# Patient Record
Sex: Female | Born: 1965
Health system: Southern US, Community
[De-identification: ages and names within clinical notes are randomized; demographics above are authoritative.]

## PROBLEM LIST (undated history)

## (undated) DIAGNOSIS — F419 Anxiety disorder, unspecified: Secondary | ICD-10-CM

## (undated) DIAGNOSIS — F32A Depression, unspecified: Secondary | ICD-10-CM

## (undated) DIAGNOSIS — F418 Other specified anxiety disorders: Secondary | ICD-10-CM

## (undated) DIAGNOSIS — I10 Essential (primary) hypertension: Secondary | ICD-10-CM

## (undated) DIAGNOSIS — G43909 Migraine, unspecified, not intractable, without status migrainosus: Secondary | ICD-10-CM

## (undated) DIAGNOSIS — K219 Gastro-esophageal reflux disease without esophagitis: Secondary | ICD-10-CM

## (undated) DIAGNOSIS — F329 Major depressive disorder, single episode, unspecified: Secondary | ICD-10-CM

## (undated) DIAGNOSIS — N200 Calculus of kidney: Secondary | ICD-10-CM

## (undated) DIAGNOSIS — Z87440 Personal history of urinary (tract) infections: Secondary | ICD-10-CM

## (undated) HISTORY — DX: Essential (primary) hypertension: I10

## (undated) HISTORY — DX: Migraine, unspecified, not intractable, without status migrainosus: G43.909

## (undated) HISTORY — DX: Calculus of kidney: N20.0

## (undated) HISTORY — DX: Personal history of urinary (tract) infections: Z87.440

## (undated) HISTORY — DX: Gastro-esophageal reflux disease without esophagitis: K21.9

## (undated) HISTORY — PX: OTHER SURGICAL HISTORY: SHX169

---

## 1992-03-09 HISTORY — PX: CHOLECYSTECTOMY: SHX55

## 2004-10-13 ENCOUNTER — Emergency Department: Payer: Self-pay | Admitting: Emergency Medicine

## 2006-05-06 ENCOUNTER — Ambulatory Visit (HOSPITAL_COMMUNITY): Admission: RE | Admit: 2006-05-06 | Discharge: 2006-05-06 | Payer: Self-pay

## 2008-07-27 ENCOUNTER — Ambulatory Visit (HOSPITAL_COMMUNITY): Admission: RE | Admit: 2008-07-27 | Discharge: 2008-07-27 | Payer: Self-pay | Admitting: Orthopedic Surgery

## 2008-08-27 ENCOUNTER — Encounter (HOSPITAL_COMMUNITY): Admission: RE | Admit: 2008-08-27 | Discharge: 2008-11-25 | Payer: Self-pay | Admitting: Orthopedic Surgery

## 2008-10-24 ENCOUNTER — Emergency Department (HOSPITAL_COMMUNITY): Admission: EM | Admit: 2008-10-24 | Discharge: 2008-10-24 | Payer: Self-pay | Admitting: Family Medicine

## 2010-03-31 ENCOUNTER — Encounter: Payer: Self-pay | Admitting: Orthopedic Surgery

## 2010-05-29 ENCOUNTER — Other Ambulatory Visit (HOSPITAL_COMMUNITY): Payer: Self-pay | Admitting: Neurosurgery

## 2010-05-29 DIAGNOSIS — M5410 Radiculopathy, site unspecified: Secondary | ICD-10-CM

## 2010-05-29 DIAGNOSIS — M549 Dorsalgia, unspecified: Secondary | ICD-10-CM

## 2010-05-31 ENCOUNTER — Ambulatory Visit (HOSPITAL_COMMUNITY): Payer: Commercial Managed Care - PPO

## 2010-05-31 ENCOUNTER — Ambulatory Visit (HOSPITAL_COMMUNITY)
Admission: RE | Admit: 2010-05-31 | Discharge: 2010-05-31 | Disposition: A | Discharge status: Home / Self Care | Payer: Commercial Managed Care - PPO | Source: Ambulatory Visit | Attending: Neurosurgery | Admitting: Neurosurgery

## 2010-05-31 DIAGNOSIS — M545 Low back pain, unspecified: Secondary | ICD-10-CM | POA: Insufficient documentation

## 2010-05-31 DIAGNOSIS — M549 Dorsalgia, unspecified: Secondary | ICD-10-CM

## 2010-05-31 DIAGNOSIS — M5410 Radiculopathy, site unspecified: Secondary | ICD-10-CM

## 2010-06-14 LAB — POCT URINALYSIS DIP (DEVICE)
Bilirubin Urine: NEGATIVE
Glucose, UA: NEGATIVE mg/dL
Hgb urine dipstick: NEGATIVE
Ketones, ur: NEGATIVE mg/dL
Nitrite: NEGATIVE
Protein, ur: NEGATIVE mg/dL
Specific Gravity, Urine: 1.02 (ref 1.005–1.030)
Urobilinogen, UA: 0.2 mg/dL (ref 0.0–1.0)
pH: 5.5 (ref 5.0–8.0)

## 2010-06-14 LAB — POCT PREGNANCY, URINE: Preg Test, Ur: NEGATIVE

## 2011-12-14 ENCOUNTER — Telehealth: Payer: Self-pay | Admitting: Internal Medicine

## 2011-12-14 NOTE — Telephone Encounter (Signed)
Returned pt called.  Ask pt to call office

## 2011-12-14 NOTE — Telephone Encounter (Signed)
Spoke with pt.  Pt made apponitment for her daughter

## 2012-12-17 ENCOUNTER — Emergency Department: Payer: Self-pay | Admitting: Emergency Medicine

## 2012-12-17 LAB — CBC
HGB: 13.3 g/dL (ref 12.0–16.0)
MCH: 31.5 pg (ref 26.0–34.0)
Platelet: 250 10*3/uL (ref 150–440)
RBC: 4.23 10*6/uL (ref 3.80–5.20)
RDW: 13.4 % (ref 11.5–14.5)

## 2012-12-17 LAB — COMPREHENSIVE METABOLIC PANEL
Alkaline Phosphatase: 110 U/L (ref 50–136)
Anion Gap: 6 — ABNORMAL LOW (ref 7–16)
Bilirubin,Total: 0.3 mg/dL (ref 0.2–1.0)
Calcium, Total: 9 mg/dL (ref 8.5–10.1)
Creatinine: 0.71 mg/dL (ref 0.60–1.30)
EGFR (African American): >60
EGFR (Non-African Amer.): >60

## 2012-12-17 LAB — URINALYSIS, COMPLETE
Bacteria: NONE SEEN
Bilirubin,UR: NEGATIVE
Glucose,UR: NEGATIVE mg/dL (ref 0–75)
Leukocyte Esterase: NEGATIVE
Ph: 6 (ref 4.5–8.0)
Protein: NEGATIVE
RBC,UR: 63 /HPF (ref 0–5)
Squamous Epithelial: <1
WBC UR: <1 /HPF (ref 0–5)

## 2012-12-17 LAB — LIPASE, BLOOD: Lipase: 96 U/L (ref 73–393)

## 2013-06-04 ENCOUNTER — Emergency Department: Payer: Self-pay | Admitting: Internal Medicine

## 2014-10-17 ENCOUNTER — Ambulatory Visit: Payer: Commercial Managed Care - PPO | Admitting: Nurse Practitioner

## 2014-10-22 ENCOUNTER — Ambulatory Visit (INDEPENDENT_AMBULATORY_CARE_PROVIDER_SITE_OTHER): Payer: 59 | Admitting: Nurse Practitioner

## 2014-10-22 ENCOUNTER — Encounter: Payer: Self-pay | Admitting: Nurse Practitioner

## 2014-10-22 ENCOUNTER — Encounter (INDEPENDENT_AMBULATORY_CARE_PROVIDER_SITE_OTHER): Payer: Self-pay

## 2014-10-22 VITALS — BP 118/70 | HR 84 | Temp 98.0°F | Resp 16 | Ht 63.0 in | Wt 166.0 lb

## 2014-10-22 DIAGNOSIS — F329 Major depressive disorder, single episode, unspecified: Secondary | ICD-10-CM | POA: Diagnosis not present

## 2014-10-22 DIAGNOSIS — Z7689 Persons encountering health services in other specified circumstances: Secondary | ICD-10-CM

## 2014-10-22 DIAGNOSIS — R609 Edema, unspecified: Secondary | ICD-10-CM

## 2014-10-22 DIAGNOSIS — K219 Gastro-esophageal reflux disease without esophagitis: Secondary | ICD-10-CM

## 2014-10-22 DIAGNOSIS — G43809 Other migraine, not intractable, without status migrainosus: Secondary | ICD-10-CM | POA: Diagnosis not present

## 2014-10-22 DIAGNOSIS — F410 Panic disorder [episodic paroxysmal anxiety] without agoraphobia: Secondary | ICD-10-CM

## 2014-10-22 DIAGNOSIS — Z7189 Other specified counseling: Secondary | ICD-10-CM

## 2014-10-22 DIAGNOSIS — F32A Depression, unspecified: Secondary | ICD-10-CM

## 2014-10-22 DIAGNOSIS — Z87442 Personal history of urinary calculi: Secondary | ICD-10-CM

## 2014-10-22 MED ORDER — BUSPIRONE HCL 7.5 MG PO TABS: 7.5000 mg | ORAL_TABLET | Freq: Three times a day (TID) | ORAL | Status: DC

## 2014-10-22 NOTE — Patient Instructions (Addendum)
[email or web address] website for compression   Zantac 150 mg up to twice a day before meals as needed for acid reflux   Stress and Stress Management Stress is a normal reaction to life events. It is what you feel when life demands more than you are used to or more than you can handle. Some stress can be useful. For example, the stress reaction can help you catch the last bus of the day, study for a test, or meet a deadline at work. But stress that occurs too often or for too long can cause problems. It can affect your emotional health and interfere with relationships and normal daily activities. Too much stress can weaken your immune system and increase your risk for physical illness. If you already have a medical problem, stress can make it worse. CAUSES  All sorts of life events may cause stress. An event that causes stress for one person may not be stressful for another person. Major life events commonly cause stress. These may be positive or negative. Examples include losing your job, moving into a new home, getting married, having a baby, or losing a loved one. Less obvious life events may also cause stress, especially if they occur day after day or in combination. Examples include working long hours, driving in traffic, caring for children, being in debt, or being in a difficult relationship. SIGNS AND SYMPTOMS Stress may cause emotional symptoms including, the following:  Anxiety. This is feeling worried, afraid, on edge, overwhelmed, or out of control.  Anger. This is feeling irritated or impatient.  Depression. This is feeling sad, down, helpless, or guilty.  Difficulty focusing, remembering, or making decisions. Stress may cause physical symptoms, including the following:   Aches and pains. These may affect your head, neck, back, stomach, or other areas of your body.  Tight muscles or clenched jaw.  Low energy or trouble sleeping. Stress may cause unhealthy behaviors, including the  following:   Eating to feel better (overeating) or skipping meals.  Sleeping too little, too much, or both.  Working too much or putting off tasks (procrastination).  Smoking, drinking alcohol, or using drugs to feel better. DIAGNOSIS  Stress is diagnosed through an assessment by your health care provider. Your health care provider will ask questions about your symptoms and any stressful life events.Your health care provider will also ask about your medical history and may order blood tests or other tests. Certain medical conditions and medicine can cause physical symptoms similar to stress. Mental illness can cause emotional symptoms and unhealthy behaviors similar to stress. Your health care provider may refer you to a mental health professional for further evaluation.  TREATMENT  Stress management is the recommended treatment for stress.The goals of stress management are reducing stressful life events and coping with stress in healthy ways.  Techniques for reducing stressful life events include the following:  Stress identification. Self-monitor for stress and identify what causes stress for you. These skills may help you to avoid some stressful events.  Time management. Set your priorities, keep a calendar of events, and learn to say "no." These tools can help you avoid making too many commitments. Techniques for coping with stress include the following:  Rethinking the problem. Try to think realistically about stressful events rather than ignoring them or overreacting. Try to find the positives in a stressful situation rather than focusing on the negatives.  Exercise. Physical exercise can release both physical and emotional tension. The key is to find a form of exercise  you enjoy and do it regularly.  Relaxation techniques. These relax the body and mind. Examples include yoga, meditation, tai chi, biofeedback, deep breathing, progressive muscle relaxation, listening to music, being  out in nature, journaling, and other hobbies. Again, the key is to find one or more that you enjoy and can do regularly.  Healthy lifestyle. Eat a balanced diet, get plenty of sleep, and do not smoke. Avoid using alcohol or drugs to relax.  Strong support network. Spend time with family, friends, or other people you enjoy being around.Express your feelings and talk things over with someone you trust. Counseling or talktherapy with a mental health professional may be helpful if you are having difficulty managing stress on your own. Medicine is typically not recommended for the treatment of stress.Talk to your health care provider if you think you need medicine for symptoms of stress. HOME CARE INSTRUCTIONS  Keep all follow-up visits as directed by your health care provider.  Take all medicines as directed by your health care provider. SEEK MEDICAL CARE IF:  Your symptoms get worse or you start having new symptoms.  You feel overwhelmed by your problems and can no longer manage them on your own. SEEK IMMEDIATE MEDICAL CARE IF:  You feel like hurting yourself or someone else. Document Released: 08/19/2000 Document Revised: 07/10/2013 Document Reviewed: 10/18/2012 [facility] Patient Information 2015 [place], [place]. This information is not intended to replace advice given to you by your health care provider. Make sure you discuss any questions you have with your health care provider.

## 2014-10-22 NOTE — Progress Notes (Signed)
Pre visit review using our clinic review tool, if applicable. No additional management support is needed unless otherwise documented below in the visit note. 

## 2014-10-22 NOTE — Progress Notes (Signed)
Patient ID: Christina Galvan, female    DOB: 1965-07-14  Age: 49 y.o. MRN: [id number]  CC: Establish Care   HPI ALDEA AVIS presents for establishing care today.   1) New pt info:  Immunizations- tdap 2007  Mammogram- 02/27/14 [facility]  Pap- [facility]  Eye Exam- 5 years ago, glasses   2) Chronic Problems-  Depression- last year, stress- money is a big issue   GERD- Prilosec, wants to switch to something else    Tomato sauce, [place] foods   Migraines- 6 months last one   Kidney stones- twice   3) Acute Problems- Swelling into legs with rash and pain intermittently x 1 year  Compression hose not helpful   Panic attacks x 1 year after son had a poor diagnosis   No counselor, supportive friend, up to 3 a day (attacks) 5 mins long. Breathes deeply she reports  History Elli has a past medical history of Depression; GERD (gastroesophageal reflux disease); Kidney stones; Hypertension; Migraines; and History of frequent urinary tract infections.   She has past surgical history that includes Cholecystectomy (1994) and Cesarean section (1993).   Her family history includes Arthritis in her mother; Asthma in her daughter; Cancer in her father and paternal uncle; Diabetes in her brother and mother; Heart disease in her maternal grandfather, mother, and paternal grandfather; Hypertension in her mother.She reports that she has been smoking Cigarettes.  She started smoking about 30 years ago. She has been smoking about 0.25 packs per day. She has never used smokeless tobacco. She reports that she drinks alcohol. She reports that she does not use illicit drugs.  No outpatient prescriptions prior to visit.   No facility-administered medications prior to visit.   ROS Review of Systems  Constitutional: Negative for fever, chills, diaphoresis and fatigue.  Respiratory: Negative for chest tightness, shortness of breath and wheezing.   Cardiovascular:  Negative for chest pain, palpitations and leg swelling.  Gastrointestinal: Negative for nausea, vomiting, diarrhea and constipation.  Skin: Negative for rash.  Neurological: Positive for headaches. Negative for dizziness, weakness and numbness.  Psychiatric/Behavioral: Positive for sleep disturbance. Negative for suicidal ideas. The patient is nervous/anxious.     Objective:  BP 118/70 mmHg  Pulse 84  Temp(Src) 98 F (36.7 C)  Resp 16  Ht 5\' 3"  (1.6 m)  Wt 166 lb (75.297 kg)  BMI 29.41 kg/m2  SpO2 98%  Physical Exam  Constitutional: She is oriented to person, place, and time. She appears well-developed and well-nourished. No distress.  HENT:  Head: Normocephalic and atraumatic.  Right Ear: External ear normal.  Left Ear: External ear normal.  Cardiovascular: Normal rate, regular rhythm and normal heart sounds.   Pulmonary/Chest: Effort normal and breath sounds normal. No respiratory distress. She has no wheezes. She has no rales. She exhibits no tenderness.  Neurological: She is alert and oriented to person, place, and time. No cranial nerve deficit. She exhibits normal muscle tone. Coordination normal.  Skin: Skin is warm and dry. No rash noted. She is not diaphoretic.  Psychiatric: Her behavior is normal. Judgment and thought content normal.  Patient is anxious, not tearful, good eye contact   Assessment & Plan:   Destenie was seen today for establish care.  Diagnoses and all orders for this visit:  Panic attack  Depression  Gastroesophageal reflux disease without esophagitis  Other migraine without status migrainosus, not intractable  History of nephrolithiasis  Peripheral edema  Encounter to establish care  Other orders -     busPIRone (BUSPAR) 7.5 MG tablet; Take 1 tablet (7.5 mg total) by mouth 3 (three) times daily.   I am having Ms. Espey start on busPIRone. I am also having her maintain her trandolapril and traMADol.  Meds ordered this encounter   Medications  . trandolapril (MAVIK) 1 MG tablet    Sig: Take 1 mg by mouth daily.  . traMADol (ULTRAM) 50 MG tablet    Sig: Take by mouth every 6 (six) hours as needed.  . busPIRone (BUSPAR) 7.5 MG tablet    Sig: Take 1 tablet (7.5 mg total) by mouth 3 (three) times daily.    Dispense:  90 tablet    Refill:  0    Order Specific Question:  Supervising Provider    Answer:  Crecencio Mc [[id number]]     Follow-up: Return in about 4 weeks (around 11/19/2014) for Anxiety .

## 2014-10-28 DIAGNOSIS — F329 Major depressive disorder, single episode, unspecified: Secondary | ICD-10-CM | POA: Insufficient documentation

## 2014-10-28 DIAGNOSIS — F32A Depression, unspecified: Secondary | ICD-10-CM | POA: Insufficient documentation

## 2014-10-28 DIAGNOSIS — G43909 Migraine, unspecified, not intractable, without status migrainosus: Secondary | ICD-10-CM | POA: Insufficient documentation

## 2014-10-28 DIAGNOSIS — F419 Anxiety disorder, unspecified: Secondary | ICD-10-CM

## 2014-10-28 DIAGNOSIS — Z87442 Personal history of urinary calculi: Secondary | ICD-10-CM | POA: Insufficient documentation

## 2014-10-28 DIAGNOSIS — K219 Gastro-esophageal reflux disease without esophagitis: Secondary | ICD-10-CM | POA: Insufficient documentation

## 2014-10-28 DIAGNOSIS — F418 Other specified anxiety disorders: Secondary | ICD-10-CM | POA: Insufficient documentation

## 2014-10-28 DIAGNOSIS — R609 Edema, unspecified: Secondary | ICD-10-CM | POA: Insufficient documentation

## 2014-10-28 DIAGNOSIS — Z7689 Persons encountering health services in other specified circumstances: Secondary | ICD-10-CM | POA: Insufficient documentation

## 2014-10-28 NOTE — Assessment & Plan Note (Signed)
Doubt cardiac or renal etiology. Probably vascular in nature. Pt reports compression hose not helpful, but unsure of what level she is using and reports cheap quality. Described trying a website listed on the AVS to try to obtain good quality compression hose. FU in 1 month.

## 2014-10-28 NOTE — Assessment & Plan Note (Signed)
Worsening. Will try Buspar 7.5 mg twice daily then up to 3 times daily after 7 days. Follow up in 1 month

## 2014-10-28 NOTE — Assessment & Plan Note (Signed)
Stable. 6 months ago last episode. Unsure about auras. Will follow.

## 2014-10-28 NOTE — Assessment & Plan Note (Signed)
Twice in her life. No recent episodes. Will follow

## 2014-10-28 NOTE — Assessment & Plan Note (Signed)
Prilosec not helpful. Uncontrolled without esophagitis. Understands her food triggers. Asked pt to try Zantac 150 mg 1 x daily 30 min before largest meal.

## 2014-10-28 NOTE — Assessment & Plan Note (Signed)
Discussed acute and chronic issues. Reviewed health maintenance measures, PFSHx, and immunizations. Obtain records from previous facility.   

## 2014-10-28 NOTE — Assessment & Plan Note (Signed)
Last year noticed feeling sadness and crying easily. Uncontrolled currently, but feels anxiety is more of an issue currently. Stressors include family and money. Will follow up next month

## 2014-11-14 ENCOUNTER — Other Ambulatory Visit: Payer: Self-pay | Admitting: *Deleted

## 2014-11-14 ENCOUNTER — Telehealth: Payer: Self-pay | Admitting: *Deleted

## 2014-11-14 MED ORDER — TRANDOLAPRIL 1 MG PO TABS: 1.0000 mg | ORAL_TABLET | Freq: Every day | ORAL | Status: DC

## 2014-11-14 NOTE — Telephone Encounter (Signed)
Left message on VM to return call to schedule appoint for TRamadol

## 2014-11-14 NOTE — Telephone Encounter (Addendum)
Fax from pharmacy requesting Tamadol 50mg  tablet take 1 tablet every 4-6 hours as needed for pain. Trandolapril 1mg  tablet take 1 tablet by mouth every morning.  Only OV 8.15.16, appears you have not filled this medication before.  Next OV 9.16.16.  Please advise refill

## 2014-11-14 NOTE — Telephone Encounter (Signed)
Okay to fill the Trandolapril #30 with 2 refills. I am not sure why she is on the tramadol. She will need to be seen to review if she still needs pain medication. Thanks!

## 2014-11-15 ENCOUNTER — Telehealth: Payer: Self-pay | Admitting: *Deleted

## 2014-11-15 NOTE — Telephone Encounter (Signed)
Spoke with pt, she states she will wait until her 9.13.16 appoint to discuss Tramadol.

## 2014-11-15 NOTE — Telephone Encounter (Signed)
Thanks

## 2014-11-20 ENCOUNTER — Encounter: Payer: Self-pay | Admitting: Nurse Practitioner

## 2014-11-20 ENCOUNTER — Ambulatory Visit (INDEPENDENT_AMBULATORY_CARE_PROVIDER_SITE_OTHER): Payer: 59 | Admitting: Nurse Practitioner

## 2014-11-20 VITALS — BP 120/84 | HR 78 | Temp 98.1°F | Resp 14 | Ht 63.0 in | Wt 168.8 lb

## 2014-11-20 DIAGNOSIS — Z13220 Encounter for screening for lipoid disorders: Secondary | ICD-10-CM

## 2014-11-20 DIAGNOSIS — Z833 Family history of diabetes mellitus: Secondary | ICD-10-CM | POA: Diagnosis not present

## 2014-11-20 DIAGNOSIS — K219 Gastro-esophageal reflux disease without esophagitis: Secondary | ICD-10-CM

## 2014-11-20 DIAGNOSIS — R609 Edema, unspecified: Secondary | ICD-10-CM

## 2014-11-20 DIAGNOSIS — Z130 Encounter for screening for diseases of the blood and blood-forming organs and certain disorders involving the immune mechanism: Secondary | ICD-10-CM | POA: Diagnosis not present

## 2014-11-20 DIAGNOSIS — F418 Other specified anxiety disorders: Secondary | ICD-10-CM

## 2014-11-20 DIAGNOSIS — R809 Proteinuria, unspecified: Secondary | ICD-10-CM | POA: Diagnosis not present

## 2014-11-20 DIAGNOSIS — M545 Low back pain, unspecified: Secondary | ICD-10-CM

## 2014-11-20 LAB — CBC WITH DIFFERENTIAL/PLATELET
BASOS PCT: 1.4 % (ref 0.0–3.0)
Basophils Absolute: 0.1 10*3/uL (ref 0.0–0.1)
EOS PCT: 1.3 % (ref 0.0–5.0)
Eosinophils Absolute: 0.1 10*3/uL (ref 0.0–0.7)
HCT: 44.2 % (ref 36.0–46.0)
Hemoglobin: 15 g/dL (ref 12.0–15.0)
LYMPHS ABS: 2.9 10*3/uL (ref 0.7–4.0)
Lymphocytes Relative: 38.3 % (ref 12.0–46.0)
MCHC: 34 g/dL (ref 30.0–36.0)
MCV: 93.9 fl (ref 78.0–100.0)
MONO ABS: 0.4 10*3/uL (ref 0.1–1.0)
Monocytes Relative: 4.7 % (ref 3.0–12.0)
NEUTROS ABS: 4.1 10*3/uL (ref 1.4–7.7)
NEUTROS PCT: 54.3 % (ref 43.0–77.0)
PLATELETS: 237 10*3/uL (ref 150.0–400.0)
RBC: 4.7 Mil/uL (ref 3.87–5.11)
RDW: 14.1 % (ref 11.5–15.5)
WBC: 7.5 10*3/uL (ref 4.0–10.5)

## 2014-11-20 LAB — LIPID PANEL
CHOL/HDL RATIO: 5
Cholesterol: 156 mg/dL (ref 0–200)
HDL: 33.4 mg/dL — ABNORMAL LOW (ref >39.00)
LDL Cholesterol: 89 mg/dL (ref 0–99)
NONHDL: 122.58
Triglycerides: 166 mg/dL — ABNORMAL HIGH (ref 0.0–149.0)
VLDL: 33.2 mg/dL (ref 0.0–40.0)

## 2014-11-20 LAB — COMPREHENSIVE METABOLIC PANEL
ALK PHOS: 72 U/L (ref 39–117)
ALT: 25 U/L (ref 0–35)
AST: 27 U/L (ref 0–37)
Albumin: 4 g/dL (ref 3.5–5.2)
BUN: 15 mg/dL (ref 6–23)
CHLORIDE: 104 meq/L (ref 96–112)
CO2: 28 meq/L (ref 19–32)
Calcium: 9.2 mg/dL (ref 8.4–10.5)
Creatinine, Ser: 0.52 mg/dL (ref 0.40–1.20)
GFR: 132.88 mL/min (ref >60.00)
GLUCOSE: 90 mg/dL (ref 70–99)
POTASSIUM: 4 meq/L (ref 3.5–5.1)
SODIUM: 137 meq/L (ref 135–145)
TOTAL PROTEIN: 7 g/dL (ref 6.0–8.3)
Total Bilirubin: 0.3 mg/dL (ref 0.2–1.2)

## 2014-11-20 LAB — HEMOGLOBIN A1C: HEMOGLOBIN A1C: 5.6 % (ref 4.6–6.5)

## 2014-11-20 MED ORDER — TRAMADOL HCL 50 MG PO TABS: 50.0000 mg | ORAL_TABLET | Freq: Four times a day (QID) | ORAL | Status: DC | PRN

## 2014-11-20 MED ORDER — OMEPRAZOLE 20 MG PO CPDR: 20.0000 mg | DELAYED_RELEASE_CAPSULE | Freq: Every day | ORAL | Status: DC

## 2014-11-20 NOTE — Patient Instructions (Addendum)
Follow up in 3 months.   Food Choices for Gastroesophageal Reflux Disease When you have gastroesophageal reflux disease (GERD), the foods you eat and your eating habits are very important. Choosing the right foods can help ease the discomfort of GERD. WHAT GENERAL GUIDELINES DO I NEED TO FOLLOW?  Choose fruits, vegetables, whole grains, low-fat dairy products, and low-fat meat, fish, and poultry.  Limit fats such as oils, salad dressings, butter, nuts, and avocado.  Keep a food diary to identify foods that cause symptoms.  Avoid foods that cause reflux. These may be different for different people.  Eat frequent small meals instead of three large meals each day.  Eat your meals slowly, in a relaxed setting.  Limit fried foods.  Cook foods using methods other than frying.  Avoid drinking alcohol.  Avoid drinking large amounts of liquids with your meals.  Avoid bending over or lying down until 2-3 hours after eating. WHAT FOODS ARE NOT RECOMMENDED? The following are some foods and drinks that may worsen your symptoms: Vegetables Tomatoes. Tomato juice. Tomato and spaghetti sauce. Chili peppers. Onion and garlic. Horseradish. Fruits Oranges, grapefruit, and lemon (fruit and juice). Meats High-fat meats, fish, and poultry. This includes hot dogs, ribs, ham, sausage, salami, and bacon. Dairy Whole milk and chocolate milk. Sour cream. Cream. Butter. Ice cream. Cream cheese.  Beverages Coffee and tea, with or without caffeine. Carbonated beverages or energy drinks. Condiments Hot sauce. Barbecue sauce.  Sweets/Desserts Chocolate and cocoa. Donuts. Peppermint and spearmint. Fats and Oils High-fat foods, including [place] fries and potato chips. Other Vinegar. Strong spices, such as black pepper, white pepper, red pepper, cayenne, curry powder, cloves, ginger, and chili powder. The items listed above may not be a complete list of foods and beverages to avoid. Contact your  dietitian for more information. Document Released: 02/23/2005 Document Revised: 02/28/2013 Document Reviewed: 12/28/2012 [facility] Patient Information 2015 [place], [place]. This information is not intended to replace advice given to you by your health care provider. Make sure you discuss any questions you have with your health care provider.

## 2014-11-20 NOTE — Progress Notes (Signed)
Pre visit review using our clinic review tool, if applicable. No additional management support is needed unless otherwise documented below in the visit note. 

## 2014-11-20 NOTE — Progress Notes (Addendum)
Patient ID: Christina Galvan, female    DOB: September 13, 1965  Age: 49 y.o. MRN: [id number]  CC: Follow-up   HPI Christina Galvan presents for GERD, depression with anxiety, and peripheral edema.  1) GERD- Zantac- 150 mg daily asked her to try in August;   Pt reports not working as well for her, burning sensation  2) Anxiety- Buspar 7.5 mg up to three x daily started last visit in August  Pt reports she is taking 1/2 prn due to "mellowing out too much"   3) Peripheral Edema- tried compression hose  Pt reports helpful   4) Depression- Denies further problems at this time.   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 Pt went to hospital, then followed up with Dr. Gladstone Lighter L4-L5 and S1 was tilted.  Neurosurgery- surgeon told her to work on conservative therapy.   Tramadol- takes one at work since she works on her feet   Lose dose this morning   History Christina Galvan has a past medical history of Depression; GERD (gastroesophageal reflux disease); Kidney stones; Hypertension; Migraines; and History of frequent urinary tract infections.   She has past surgical history that includes Cholecystectomy (1994) and Cesarean section (1993).   Her family history includes Arthritis in her mother; Asthma in her daughter; Cancer in her father and paternal uncle; Diabetes in her brother and mother; Heart disease in her maternal grandfather, mother, and paternal grandfather; Hypertension in her mother.She reports that she has been smoking Cigarettes.  She started smoking about 30 years ago. She has been smoking about 0.25 packs per day. She has never used smokeless tobacco. She reports that she drinks alcohol. She reports that she does not use illicit drugs.  Outpatient Prescriptions Prior to Visit  Medication Sig Dispense Refill  . busPIRone (BUSPAR) 7.5 MG tablet Take 1 tablet (7.5  mg total) by mouth 3 (three) times daily. 90 tablet 0  . trandolapril (MAVIK) 1 MG tablet Take 1 tablet (1 mg total) by mouth daily. 30 tablet 2  . traMADol (ULTRAM) 50 MG tablet Take by mouth every 6 (six) hours as needed.     No facility-administered medications prior to visit.    ROS Review of Systems  Constitutional: Negative for fever, chills, diaphoresis and fatigue.  Respiratory: Negative for chest tightness, shortness of breath and wheezing.   Cardiovascular: Positive for leg swelling. Negative for chest pain and palpitations.  Gastrointestinal: Negative for nausea, vomiting and diarrhea.  Musculoskeletal: Positive for back pain.  Skin: Negative for rash.  Neurological: Negative for dizziness, weakness, numbness and headaches.  Psychiatric/Behavioral: The patient is nervous/anxious.     Objective:  BP 120/84 mmHg  Pulse 78  Temp(Src) 98.1 F (36.7 C)  Resp 14  Ht _0  (1.6 m)  Wt 168 lb 12.8 oz (76.567 kg)  BMI 29.91 kg/m2  SpO2 98%  Physical Exam  Constitutional: She is oriented to person, place, and time. She appears well-developed and well-nourished. No distress.  HENT:  Head: Normocephalic and atraumatic.  Right Ear: External ear normal.  Left Ear: External ear normal.  Cardiovascular: Normal rate, regular rhythm, normal heart sounds and intact distal pulses.  Exam reveals no gallop and no friction rub.   No murmur heard. Pulmonary/Chest: Effort normal and breath sounds normal. No respiratory distress. She has no wheezes. She has no rales.  She exhibits no tenderness.  Musculoskeletal: She exhibits tenderness.  Palpation of lower back paraspinal muscles  Neurological: She is alert and oriented to person, place, and time. No cranial nerve deficit. She exhibits normal muscle tone. Coordination normal.  Skin: Skin is warm and dry. No rash noted. She is not diaphoretic.  Psychiatric: She has a normal mood and affect. Her behavior is normal. Judgment and thought  content normal.   Assessment & Plan:   Christina Galvan was seen today for follow-up.  Diagnoses and all orders for this visit:  Protein in urine -     Comp Met (CMET)  Screening for deficiency anemia -     CBC w/Diff  Family history of diabetes mellitus (DM) -     Comp Met (CMET) -     HgB A1c  Screening for hyperlipidemia -     Lipid Profile  Gastroesophageal reflux disease without esophagitis  Depression with anxiety  Peripheral edema  Bilateral low back pain without sciatica  Other orders -     traMADol (ULTRAM) 50 MG tablet; Take 1 tablet (50 mg total) by mouth every 6 (six) hours as needed. -     omeprazole (PRILOSEC) 20 MG capsule; Take 1 capsule (20 mg total) by mouth daily.   I have changed Ms. Shrout's traMADol. I am also having her start on omeprazole. Additionally, I am having her maintain her busPIRone and trandolapril.  Meds ordered this encounter  Medications  . traMADol (ULTRAM) 50 MG tablet    Sig: Take 1 tablet (50 mg total) by mouth every 6 (six) hours as needed.    Dispense:  90 tablet    Refill:  0    Order Specific Question:  Supervising Provider    Answer:  Deborra Medina L [[id number]]  . omeprazole (PRILOSEC) 20 MG capsule    Sig: Take 1 capsule (20 mg total) by mouth daily.    Dispense:  30 capsule    Refill:  3    Order Specific Question:  Supervising Provider    Answer:  Crecencio Mc [[id number]]     Follow-up: Return in about 3 months (around 02/19/2015) for Follow up for anxiety and GERD.

## 2014-11-30 ENCOUNTER — Encounter: Payer: Self-pay | Admitting: Nurse Practitioner

## 2014-11-30 DIAGNOSIS — R809 Proteinuria, unspecified: Secondary | ICD-10-CM | POA: Insufficient documentation

## 2014-11-30 DIAGNOSIS — M549 Dorsalgia, unspecified: Secondary | ICD-10-CM | POA: Insufficient documentation

## 2014-11-30 DIAGNOSIS — Z833 Family history of diabetes mellitus: Secondary | ICD-10-CM | POA: Insufficient documentation

## 2014-11-30 NOTE — Assessment & Plan Note (Signed)
Will obtain A1c and screen for hyperlipidemia as well.

## 2014-11-30 NOTE — Assessment & Plan Note (Signed)
Patient had trauma in 2004 and has seen ortho spine surgeon in past. Conservative therapy recommended. Will continue Tramadol. CSC and UDS completed today. Will follow up in 3 months.

## 2014-11-30 NOTE — Assessment & Plan Note (Signed)
Uncontrolled. Gave pt handout with foods that exacerbate GERD. Placed pt on Prilosec. Will follow up in 3 months.

## 2014-11-30 NOTE — Assessment & Plan Note (Signed)
Stable. Buspar taking 1/2 tablet prn for anxiety since last visit. Working well. If she takes a full tablet she reports too mellow feeling.

## 2014-11-30 NOTE — Assessment & Plan Note (Signed)
[facility] pt has urine tested and shows trace of protein. Pt curious as to why. Will check A1c and CMET today.

## 2014-11-30 NOTE — Assessment & Plan Note (Signed)
Stable. Compression hose helpful. FU in 3 months

## 2014-12-04 ENCOUNTER — Ambulatory Visit: Payer: 59 | Admitting: Nurse Practitioner

## 2014-12-12 ENCOUNTER — Encounter: Payer: Self-pay | Admitting: Nurse Practitioner

## 2014-12-14 ENCOUNTER — Ambulatory Visit: Payer: 59 | Admitting: Internal Medicine

## 2015-01-08 ENCOUNTER — Other Ambulatory Visit: Payer: Self-pay

## 2015-01-08 ENCOUNTER — Other Ambulatory Visit: Payer: Self-pay | Admitting: Nurse Practitioner

## 2015-01-08 MED ORDER — TRAMADOL HCL 50 MG PO TABS: 50.0000 mg | ORAL_TABLET | Freq: Four times a day (QID) | ORAL | Status: DC | PRN

## 2015-01-08 NOTE — Telephone Encounter (Signed)
Patient is asking for a refill. Please advise?

## 2015-03-07 ENCOUNTER — Other Ambulatory Visit: Payer: Self-pay | Admitting: Nurse Practitioner

## 2015-03-12 ENCOUNTER — Other Ambulatory Visit: Payer: Self-pay | Admitting: Nurse Practitioner

## 2015-03-13 NOTE — Telephone Encounter (Signed)
Patient seen in September. She had medication refilled in November. Please advise?

## 2015-03-15 ENCOUNTER — Ambulatory Visit (INDEPENDENT_AMBULATORY_CARE_PROVIDER_SITE_OTHER): Payer: 59 | Admitting: Nurse Practitioner

## 2015-03-15 VITALS — BP 158/98 | HR 97 | Ht 63.0 in | Wt 166.0 lb

## 2015-03-15 DIAGNOSIS — F418 Other specified anxiety disorders: Secondary | ICD-10-CM | POA: Diagnosis not present

## 2015-03-15 DIAGNOSIS — F410 Panic disorder [episodic paroxysmal anxiety] without agoraphobia: Secondary | ICD-10-CM

## 2015-03-15 DIAGNOSIS — M545 Low back pain, unspecified: Secondary | ICD-10-CM

## 2015-03-15 MED ORDER — TRAMADOL HCL 50 MG PO TABS: 50.0000 mg | ORAL_TABLET | Freq: Four times a day (QID) | ORAL | Status: DC | PRN

## 2015-03-15 MED ORDER — VENLAFAXINE HCL ER 37.5 MG PO TB24: 1.0000 | ORAL_TABLET | Freq: Every day | ORAL | Status: DC

## 2015-03-15 NOTE — Patient Instructions (Addendum)
Follow up in 3 months

## 2015-03-15 NOTE — Progress Notes (Signed)
Patient ID: [name], female    DOB: 1965-08-05  Age: 50 y.o. MRN: [id number]  CC: Follow-up   HPI Christina Galvan presents for follow up of pain medication.   1) Back pain- She reports taking tramadol as needed when walking a lot at work  Moderate to severe pain  Missed a few days of the medication due to running out and feels her back pain is worse Does not want surgery  2) Panic attacks- still happening, discussed options  Feels very panicky in multiple types of situations with tachycardia, diaphoresis, crying ect...  History Christina Galvan has a past medical history of Depression; GERD (gastroesophageal reflux disease); Kidney stones; Hypertension; Migraines; and History of frequent urinary tract infections.   She has past surgical history that includes Cholecystectomy (1994) and Cesarean section (1993).   Her family history includes Arthritis in her mother; Asthma in her daughter; Cancer in her father and paternal uncle; Diabetes in her brother and mother; Heart disease in her maternal grandfather, mother, and paternal grandfather; Hypertension in her mother.She reports that she has been smoking Cigarettes.  She started smoking about 30 years ago. She has been smoking about 0.25 packs per day. She has never used smokeless tobacco. She reports that she drinks alcohol. She reports that she does not use illicit drugs.  Outpatient Prescriptions Prior to Visit  Medication Sig Dispense Refill  . omeprazole (PRILOSEC) 20 MG capsule Take 1 capsule (20 mg total) by mouth daily. 30 capsule 3  . trandolapril (MAVIK) 1 MG tablet Take 1 tablet (1 mg total) by mouth daily. 30 tablet 2  . busPIRone (BUSPAR) 7.5 MG tablet Take 1 tablet (7.5 mg total) by mouth 3 (three) times daily. 90 tablet 0  . traMADol (ULTRAM) 50 MG tablet Take 1 tablet (50 mg total) by mouth every 6 (six) hours as needed. 90 tablet 0   No facility-administered medications prior to visit.    ROS Review of Systems   Constitutional: Negative for fever, chills, diaphoresis and fatigue.  Respiratory: Negative for chest tightness, shortness of breath and wheezing.   Cardiovascular: Negative for chest pain, palpitations and leg swelling.  Gastrointestinal: Negative for nausea, vomiting and diarrhea.  Musculoskeletal: Positive for myalgias and back pain. Negative for joint swelling, arthralgias, gait problem, neck pain and neck stiffness.  Skin: Negative for rash.  Neurological: Negative for dizziness, weakness, numbness and headaches.  Psychiatric/Behavioral: The patient is not nervous/anxious.     Objective:  BP 158/98 mmHg  Pulse 97  Ht 5\' 3"  (1.6 m)  Wt 166 lb (75.297 kg)  BMI 29.41 kg/m2  SpO2 97%  Physical Exam  Constitutional: She is oriented to person, place, and time. She appears well-developed and well-nourished. No distress.  HENT:  Head: Normocephalic and atraumatic.  Right Ear: External ear normal.  Left Ear: External ear normal.  Neck: Normal range of motion. Neck supple.  Cardiovascular: Normal rate, regular rhythm and normal heart sounds.  Exam reveals no gallop and no friction rub.   No murmur heard. Pulmonary/Chest: Effort normal and breath sounds normal. No respiratory distress. She has no wheezes. She has no rales. She exhibits no tenderness.  Musculoskeletal: Normal range of motion. She exhibits tenderness. She exhibits no edema.  Paraspinal muscle tightness bilaterally lower back, tender to palpation  Neurological: She is alert and oriented to person, place, and time. No cranial nerve deficit. She exhibits normal muscle tone. Coordination normal.  Skin: Skin is warm and dry. No rash noted. She is not  diaphoretic.  Psychiatric: She has a normal mood and affect. Her behavior is normal. Judgment and thought content normal.      Assessment & Plan:   Christina Galvan was seen today for follow-up.  Diagnoses and all orders for this visit:  Bilateral low back pain without  sciatica  Panic attack  Depression with anxiety  Other orders -     traMADol (ULTRAM) 50 MG tablet; Take 1 tablet (50 mg total) by mouth every 6 (six) hours as needed. -     Discontinue: Venlafaxine HCl 37.5 MG TB24; Take 1 tablet (37.5 mg total) by mouth daily.   I have discontinued Christina Galvan's busPIRone and Venlafaxine HCl. I am also having her maintain her trandolapril, omeprazole, and traMADol.  Meds ordered this encounter  Medications  . traMADol (ULTRAM) 50 MG tablet    Sig: Take 1 tablet (50 mg total) by mouth every 6 (six) hours as needed.    Dispense:  90 tablet    Refill:  1    Order Specific Question:  Supervising Provider    Answer:  Deborra Medina L [[id number]]  . DISCONTD: Venlafaxine HCl 37.5 MG TB24    Sig: Take 1 tablet (37.5 mg total) by mouth daily.    Dispense:  30 each    Refill:  1    Order Specific Question:  Supervising Provider    Answer:  Crecencio Mc [[id number]]     Follow-up: Return in about 3 months (around 06/13/2015) for Medication follow up .

## 2015-03-18 MED FILL — traMADol HCL 50 MG TABS: 50 | 22 days supply | Qty: 90 | Fill #0

## 2015-03-24 ENCOUNTER — Encounter: Payer: Self-pay | Admitting: Nurse Practitioner

## 2015-03-24 NOTE — Assessment & Plan Note (Signed)
Established problem stable Discussed starting an SNRI  She is interested, but it could cause Serotonin Syndrome with continued use with tramadol. Will look at other types of medications and discussed counseling.  FU in 3 months

## 2015-03-24 NOTE — Assessment & Plan Note (Signed)
Established problem worsening Pt declines referrals or imaging today Will continue prn tramadol  FU in 3 months

## 2015-03-24 NOTE — Assessment & Plan Note (Signed)
Established problem stable Buspar makes her very sleepy  Discussed treatment alternatives Sent in Effexor (interferes with tramadol)  Pt is leaning towards counseling FU in 3 months

## 2015-03-26 ENCOUNTER — Other Ambulatory Visit: Payer: Self-pay | Admitting: Nurse Practitioner

## 2015-03-26 ENCOUNTER — Encounter: Payer: Self-pay | Admitting: Nurse Practitioner

## 2015-03-26 MED FILL — OMEPRAZOLE DR 20 MG CAPSULE: 20 | 30 days supply | Qty: 30 | Fill #0

## 2015-04-19 ENCOUNTER — Other Ambulatory Visit: Payer: Self-pay | Admitting: Nurse Practitioner

## 2015-04-23 MED FILL — TRANDOLAPRIL 1 MG TABLET: 1 | 30 days supply | Qty: 30 | Fill #0

## 2015-04-26 MED FILL — OMEPRAZOLE DR 20 MG CAPSULE: 20 | 30 days supply | Qty: 30 | Fill #1

## 2015-05-07 MED FILL — traMADol HCL 50 MG TABS: 50 | 22 days supply | Qty: 90 | Fill #1

## 2015-05-24 MED FILL — OMEPRAZOLE DR 20 MG CAPSULE: 20 | 30 days supply | Qty: 30 | Fill #2

## 2015-06-13 ENCOUNTER — Ambulatory Visit: Payer: 59 | Admitting: Nurse Practitioner

## 2015-06-24 ENCOUNTER — Ambulatory Visit (INDEPENDENT_AMBULATORY_CARE_PROVIDER_SITE_OTHER): Payer: 59 | Admitting: Nurse Practitioner

## 2015-06-24 ENCOUNTER — Encounter: Payer: Self-pay | Admitting: Nurse Practitioner

## 2015-06-24 VITALS — BP 134/97 | HR 85 | Temp 98.3°F | Ht 63.0 in | Wt 166.1 lb

## 2015-06-24 DIAGNOSIS — M545 Low back pain, unspecified: Secondary | ICD-10-CM

## 2015-06-24 DIAGNOSIS — F418 Other specified anxiety disorders: Secondary | ICD-10-CM

## 2015-06-24 MED ORDER — TRAMADOL HCL 50 MG PO TABS: 50.0000 mg | ORAL_TABLET | Freq: Four times a day (QID) | ORAL | Status: DC | PRN

## 2015-06-24 MED ORDER — BUSPIRONE HCL 7.5 MG PO TABS: 7.5000 mg | ORAL_TABLET | Freq: Every day | ORAL | Status: DC

## 2015-06-24 MED FILL — traMADol HCL 50 MG TABS: 50 | 22 days supply | Qty: 90 | Fill #0

## 2015-06-24 MED FILL — busPIRone HCL 7.5 MG TABS: 7.5 | 90 days supply | Qty: 90 | Fill #0

## 2015-06-24 NOTE — Patient Instructions (Signed)
See you in 4 months. Call to make your appointment.

## 2015-06-24 NOTE — Progress Notes (Signed)
Patient ID: Christina Galvan, female    DOB: [birth year]  Age: 50 y.o. MRN: [id number]  CC: Follow-up   HPI Christina Galvan presents for follow up of medications.  1) Back pain- declined imaging 3 months ago Continuing prn tramadol  Pt stopped buspar due to fatigue at last visit Couldn't use effexor with tramadol   2) Buspar taking 1/2 tablet and may take the other 1/2 1-2 hrs later for anxiety. Helpful  History Christina Galvan has a past medical history of Depression; GERD (gastroesophageal reflux disease); Kidney stones; Hypertension; Migraines; and History of frequent urinary tract infections.   She has past surgical history that includes Cholecystectomy (1994) and Cesarean section (1993).   Her family history includes Arthritis in her mother; Asthma in her daughter; Cancer in her father and paternal uncle; Diabetes in her brother and mother; Heart disease in her maternal grandfather, mother, and paternal grandfather; Hypertension in her mother.She reports that she has been smoking Cigarettes.  She started smoking about 30 years ago. She has been smoking about 0.25 packs per day. She has never used smokeless tobacco. She reports that she drinks alcohol. She reports that she does not use illicit drugs.  Outpatient Prescriptions Prior to Visit  Medication Sig Dispense Refill  . omeprazole (PRILOSEC) 20 MG capsule TAKE 1 CAPSULE BY MOUTH ONCE DAILY 30 capsule 3  . trandolapril (MAVIK) 1 MG tablet TAKE 1 TABLET BY MOUTH ONCE DAILY 30 tablet 5  . traMADol (ULTRAM) 50 MG tablet Take 1 tablet (50 mg total) by mouth every 6 (six) hours as needed. 90 tablet 1   No facility-administered medications prior to visit.    ROS Review of Systems  Constitutional: Negative for fever, chills, diaphoresis, activity change, appetite change, fatigue and unexpected weight change.  Eyes: Negative for visual disturbance.  Respiratory: Negative for chest tightness and shortness of breath.   Cardiovascular:  Negative for chest pain.  Gastrointestinal: Negative for nausea, vomiting and diarrhea.  Musculoskeletal: Positive for myalgias and back pain. Negative for joint swelling, arthralgias, gait problem, neck pain and neck stiffness.  Neurological: Negative for headaches.  Psychiatric/Behavioral: Negative for suicidal ideas and sleep disturbance. The patient is nervous/anxious.     Objective:  BP 134/97 mmHg  Pulse 85  Temp(Src) 98.3 F (36.8 C) (Oral)  Ht 5\' 3"  (1.6 m)  Wt 166 lb 2 oz (75.354 kg)  BMI 29.44 kg/m2  SpO2 98%  Physical Exam  Constitutional: She is oriented to person, place, and time. She appears well-developed and well-nourished. No distress.  HENT:  Head: Normocephalic and atraumatic.  Right Ear: External ear normal.  Left Ear: External ear normal.  Eyes: Right eye exhibits no discharge. Left eye exhibits no discharge. No scleral icterus.  Cardiovascular: Normal rate and regular rhythm.   Pulmonary/Chest: Effort normal and breath sounds normal. No respiratory distress. She has no wheezes. She has no rales. She exhibits no tenderness.  Neurological: She is alert and oriented to person, place, and time. No cranial nerve deficit. She exhibits normal muscle tone. Coordination normal.  Tenderness to palpation of paraspinal muscles-lumbar  Skin: Skin is warm and dry. No rash noted. She is not diaphoretic.  Psychiatric: She has a normal mood and affect. Her behavior is normal. Judgment and thought content normal.      Assessment & Plan:   [name] was seen today for follow-up.  Diagnoses and all orders for this visit:  Depression with anxiety  Bilateral low back pain without sciatica  Other orders -  busPIRone (BUSPAR) 7.5 MG tablet; Take 1 tablet (7.5 mg total) by mouth at bedtime. -     traMADol (ULTRAM) 50 MG tablet; Take 1 tablet (50 mg total) by mouth every 6 (six) hours as needed.  I have changed Christina Galvan's busPIRone. I am also having her maintain her  omeprazole, trandolapril, and traMADol.  Meds ordered this encounter  Medications  . DISCONTD: busPIRone (BUSPAR) 7.5 MG tablet    Sig: Take 7.5 mg by mouth 3 (three) times daily.  . busPIRone (BUSPAR) 7.5 MG tablet    Sig: Take 1 tablet (7.5 mg total) by mouth at bedtime.    Dispense:  90 tablet    Refill:  3    Order Specific Question:  Supervising Provider    Answer:  Deborra Medina L [[id number]]  . traMADol (ULTRAM) 50 MG tablet    Sig: Take 1 tablet (50 mg total) by mouth every 6 (six) hours as needed.    Dispense:  90 tablet    Refill:  2    Order Specific Question:  Supervising Provider    Answer:  Crecencio Mc [[id number]]     Follow-up: Return in about 4 months (around 10/24/2015) for Follow up on medications .

## 2015-06-24 NOTE — Progress Notes (Signed)
Pre visit review using our clinic review tool, if applicable. No additional management support is needed unless otherwise documented below in the visit note. 

## 2015-06-25 MED FILL — TRANDOLAPRIL 1 MG TABLET: 1 | 30 days supply | Qty: 30 | Fill #1

## 2015-06-27 MED FILL — OMEPRAZOLE DR 20 MG CAPSULE: 20 | 30 days supply | Qty: 30 | Fill #3

## 2015-06-30 NOTE — Assessment & Plan Note (Signed)
Est. Problem stable Declines further work up or referrals Stable on tramadol prn  FU in 4 months

## 2015-06-30 NOTE — Assessment & Plan Note (Signed)
Pt would like to continue Buspar- 1/2 tablet prn (may take 1/2 then 2nd half 1 hr later). FU in 4 months

## 2015-07-31 MED FILL — OMEPRAZOLE DR 20 MG CAPSULE: 20 | 30 days supply | Qty: 30 | Fill #4

## 2015-08-06 MED FILL — traMADol HCL 50 MG TABS: 50 | 22 days supply | Qty: 90 | Fill #1 | Status: TO

## 2015-08-20 MED FILL — TRANDOLAPRIL 1 MG TABLET: 1 | 30 days supply | Qty: 30 | Fill #2

## 2015-09-04 ENCOUNTER — Telehealth: Payer: Self-pay | Admitting: *Deleted

## 2015-09-04 MED ORDER — OMEPRAZOLE 20 MG PO CPDR: 20.0000 mg | DELAYED_RELEASE_CAPSULE | Freq: Every day | ORAL | Status: DC

## 2015-09-04 MED FILL — OMEPRAZOLE DR 20 MG CAPSULE: 20 | 30 days supply | Qty: 30 | Fill #0

## 2015-09-04 NOTE — Telephone Encounter (Signed)
Refilled. thanks

## 2015-09-04 NOTE — Telephone Encounter (Signed)
Patient has requested a medication refill for omeprazole [facility]

## 2015-10-17 MED FILL — OMEPRAZOLE DR 20 MG CAPSULE: 20 | 30 days supply | Qty: 30 | Fill #1

## 2015-10-25 ENCOUNTER — Ambulatory Visit: Payer: 59 | Admitting: Family Medicine

## 2015-10-25 ENCOUNTER — Ambulatory Visit: Payer: 59 | Admitting: Nurse Practitioner

## 2015-10-31 ENCOUNTER — Ambulatory Visit (INDEPENDENT_AMBULATORY_CARE_PROVIDER_SITE_OTHER): Payer: 59 | Admitting: Family Medicine

## 2015-10-31 ENCOUNTER — Encounter: Payer: Self-pay | Admitting: Family Medicine

## 2015-10-31 DIAGNOSIS — Z720 Tobacco use: Secondary | ICD-10-CM | POA: Diagnosis not present

## 2015-10-31 DIAGNOSIS — G8929 Other chronic pain: Secondary | ICD-10-CM | POA: Insufficient documentation

## 2015-10-31 DIAGNOSIS — M549 Dorsalgia, unspecified: Secondary | ICD-10-CM | POA: Diagnosis not present

## 2015-10-31 DIAGNOSIS — F410 Panic disorder [episodic paroxysmal anxiety] without agoraphobia: Secondary | ICD-10-CM

## 2015-10-31 DIAGNOSIS — I10 Essential (primary) hypertension: Secondary | ICD-10-CM | POA: Diagnosis not present

## 2015-10-31 MED ORDER — TRAMADOL HCL 50 MG PO TABS: 50.0000 mg | ORAL_TABLET | Freq: Four times a day (QID) | ORAL | 2 refills | Status: DC | PRN

## 2015-10-31 MED FILL — TRANDOLAPRIL 1 MG TABLET: 1 | 30 days supply | Qty: 30 | Fill #3

## 2015-10-31 MED FILL — traMADol HCL 50 MG TABS: 50 | 22 days supply | Qty: 90 | Fill #0

## 2015-10-31 NOTE — Progress Notes (Signed)
Subjective:  Patient ID: Christina Galvan, female    DOB: March 14, 1965  Age: 50 y.o. MRN: [id number]  CC: Follow up  HPI:  50 year old female with panic attacks/anxiety & HTN presents for follow up.  Panic/Anxiety  Doing okay on BuSpar.  Notes significant ongoing stressors.  HTN  Uncontrolled (see below).  Currently on Mavik 1 mg daily.  Will discuss today.  Chronic back pain  Stable on Tramadol.   Needs refill.  Social Hx   Social History   Social History  . Marital status: Single    Spouse name: N/A  . Number of children: N/A  . Years of education: N/A   Social History Main Topics  . Smoking status: Current Every Day Smoker    Packs/day: 0.25    Types: Cigarettes    Start date: 10/21/1984  . Smokeless tobacco: Never Used  . Alcohol use 0.0 oz/week     Comment: Rare  . Drug use: No  . Sexual activity: Not Currently    Partners: Male    Birth control/ protection: IUD   Other Topics Concern  . None   Social History Narrative   Works at [employer] with son and daughter   Pets: None   Caffeine- 1 20 oz bottle of soda    Review of Systems  Constitutional: Negative.   Musculoskeletal: Positive for back pain.  Psychiatric/Behavioral: The patient is nervous/anxious.    Objective:  BP (!) 173/101 (BP Location: Left Arm, Patient Position: Sitting, Cuff Size: Normal)   Pulse 85   Temp 98 F (36.7 C) (Oral)   Wt 167 lb (75.8 kg)   SpO2 97%   BMI 29.58 kg/m   BP/Weight 10/31/2015 [id number]  Systolic BP [id number]  Diastolic BP [id number] 97 98  Wt. (Lbs) 167 166.13 166  BMI 29.58 29.44 29.41   Physical Exam  Constitutional: She is oriented to person, place, and time. She appears well-developed. No distress.  Cardiovascular: Normal rate and regular rhythm.   Pulmonary/Chest: Effort normal. She has no wheezes. She has no rales.  Neurological: She is alert and oriented to person, place, and time.  Psychiatric: She has a normal mood and  affect.  Vitals reviewed.  Lab Results  Component Value Date   WBC 7.5 11/20/2014   HGB 15.0 11/20/2014   HCT 44.2 11/20/2014   PLT 237.0 11/20/2014   GLUCOSE 90 11/20/2014   CHOL 156 11/20/2014   TRIG 166.0 (H) 11/20/2014   HDL 33.40 (L) 11/20/2014   LDLCALC 89 11/20/2014   ALT 25 11/20/2014   AST 27 11/20/2014   NA 137 11/20/2014   K 4.0 11/20/2014   CL 104 11/20/2014   CREATININE 0.52 11/20/2014   BUN 15 11/20/2014   CO2 28 11/20/2014   HGBA1C 5.6 11/20/2014   Assessment & Plan:   Problem List Items Addressed This Visit    Panic attack    Established problem.  Doing okay/stable at this time. Does note significant stressors. Continue Buspar.      Chronic back pain    Stable. Refilling Tramadol.      Relevant Medications   traMADol (ULTRAM) 50 MG tablet   Tobacco abuse    Encouraged cessation. Offered Wellbutrin. Patient declined.       HTN (hypertension)    Uncontrolled. Increasing Mavik. Follow up in 7-10 days.       Other Visit Diagnoses   None.     Meds ordered this  encounter  Medications  . traMADol (ULTRAM) 50 MG tablet    Sig: Take 1 tablet (50 mg total) by mouth every 6 (six) hours as needed.    Dispense:  90 tablet    Refill:  2    Follow-up: 7-10 days.   [place]

## 2015-10-31 NOTE — Assessment & Plan Note (Signed)
Encouraged cessation. Offered Wellbutrin. Patient declined.

## 2015-10-31 NOTE — Assessment & Plan Note (Signed)
Established problem.  Doing okay/stable at this time. Does note significant stressors. Continue Buspar.

## 2015-10-31 NOTE — Assessment & Plan Note (Signed)
Uncontrolled. Increasing Mavik. Follow up in 7-10 days.

## 2015-10-31 NOTE — Assessment & Plan Note (Signed)
Stable. Refilling Tramadol.

## 2015-10-31 NOTE — Patient Instructions (Addendum)
Increase the Mavik to 2 mg daily.  Follow up BP check in 7-10 days. Labs at that time.  Take care  Dr. Lacinda Axon

## 2015-11-18 ENCOUNTER — Ambulatory Visit: Payer: 59 | Admitting: Family Medicine

## 2015-11-18 MED FILL — OMEPRAZOLE DR 20 MG CAPSULE: 20 | 60 days supply | Qty: 60 | Fill #2

## 2015-11-21 ENCOUNTER — Encounter: Payer: Self-pay | Admitting: Family Medicine

## 2015-11-21 ENCOUNTER — Ambulatory Visit (INDEPENDENT_AMBULATORY_CARE_PROVIDER_SITE_OTHER): Payer: 59 | Admitting: Family Medicine

## 2015-11-21 VITALS — BP 162/92 | HR 97 | Temp 98.3°F | Wt 166.4 lb

## 2015-11-21 DIAGNOSIS — Z130 Encounter for screening for diseases of the blood and blood-forming organs and certain disorders involving the immune mechanism: Secondary | ICD-10-CM | POA: Diagnosis not present

## 2015-11-21 DIAGNOSIS — R739 Hyperglycemia, unspecified: Secondary | ICD-10-CM | POA: Diagnosis not present

## 2015-11-21 DIAGNOSIS — Z13220 Encounter for screening for lipoid disorders: Secondary | ICD-10-CM

## 2015-11-21 DIAGNOSIS — I10 Essential (primary) hypertension: Secondary | ICD-10-CM | POA: Diagnosis not present

## 2015-11-21 LAB — COMPREHENSIVE METABOLIC PANEL
ALBUMIN: 3.9 g/dL (ref 3.5–5.2)
ALK PHOS: 69 U/L (ref 39–117)
ALT: 21 U/L (ref 0–35)
AST: 23 U/L (ref 0–37)
BILIRUBIN TOTAL: 0.4 mg/dL (ref 0.2–1.2)
BUN: 14 mg/dL (ref 6–23)
CALCIUM: 8.9 mg/dL (ref 8.4–10.5)
CO2: 30 mEq/L (ref 19–32)
Chloride: 106 mEq/L (ref 96–112)
Creatinine, Ser: 0.51 mg/dL (ref 0.40–1.20)
GFR: 135.35 mL/min (ref >60.00)
Glucose, Bld: 91 mg/dL (ref 70–99)
POTASSIUM: 3.8 meq/L (ref 3.5–5.1)
Sodium: 139 mEq/L (ref 135–145)
TOTAL PROTEIN: 6.7 g/dL (ref 6.0–8.3)

## 2015-11-21 LAB — LIPID PANEL
CHOLESTEROL: 167 mg/dL (ref 0–200)
HDL: 31.3 mg/dL — AB (ref >39.00)
LDL Cholesterol: 109 mg/dL — ABNORMAL HIGH (ref 0–99)
NONHDL: 135.48
TRIGLYCERIDES: 130 mg/dL (ref 0.0–149.0)
Total CHOL/HDL Ratio: 5
VLDL: 26 mg/dL (ref 0.0–40.0)

## 2015-11-21 LAB — CBC
HEMATOCRIT: 43.9 % (ref 36.0–46.0)
HEMOGLOBIN: 14.9 g/dL (ref 12.0–15.0)
MCHC: 33.9 g/dL (ref 30.0–36.0)
MCV: 94.4 fl (ref 78.0–100.0)
PLATELETS: 249 10*3/uL (ref 150.0–400.0)
RBC: 4.65 Mil/uL (ref 3.87–5.11)
RDW: 13.2 % (ref 11.5–15.5)
WBC: 6.2 10*3/uL (ref 4.0–10.5)

## 2015-11-21 LAB — HEMOGLOBIN A1C: Hgb A1c MFr Bld: 5.5 % (ref 4.6–6.5)

## 2015-11-21 MED ORDER — TRANDOLAPRIL 4 MG PO TABS: 4.0000 mg | ORAL_TABLET | Freq: Every day | ORAL | 0 refills | Status: DC

## 2015-11-21 MED ORDER — CHLORTHALIDONE 25 MG PO TABS: 25.0000 mg | ORAL_TABLET | Freq: Every day | ORAL | 0 refills | Status: DC

## 2015-11-21 NOTE — Patient Instructions (Signed)
Follow up in 7-10 days.  I have increased the [place] (new prescription sent) and started you on another medication.  Take care  Dr. Lacinda Axon

## 2015-11-21 NOTE — Progress Notes (Signed)
Subjective:  Patient ID: Christina Galvan, female    DOB: Jul 23, 1965  Age: 50 y.o. MRN: [id number]  CC: Follow up HTN  HPI:  50 year old female with hypertension, depression/anxiety, chronic back pain, tobacco presents for follow-up regarding her HTN.  HTN  Uncontrolled.  Has been having her blood pressure checked at work and has been periodically elevated.  Blood pressure markedly elevated today.  She endorses compliance with her Mavik (2 mg daily).  Patient states that she's been feeling fatigued/tired recently. She thinks this may be from her blood pressure medication.   No other complaints at this time.  Social Hx   Social History   Social History  . Marital status: Single    Spouse name: N/A  . Number of children: N/A  . Years of education: N/A   Social History Main Topics  . Smoking status: Current Every Day Smoker    Packs/day: 0.25    Types: Cigarettes    Start date: 10/21/1984  . Smokeless tobacco: Never Used  . Alcohol use 0.0 oz/week     Comment: Rare  . Drug use: No  . Sexual activity: Not Currently    Partners: Male    Birth control/ protection: IUD   Other Topics Concern  . None   Social History Narrative   Works at [employer] with son and daughter   Pets: None   Caffeine- 1 20 oz bottle of soda     Review of Systems  Constitutional: Positive for fatigue.  Respiratory: Negative.   Cardiovascular: Negative.    Objective:  BP (!) 162/92 (BP Location: Right Arm, Patient Position: Sitting, Cuff Size: Normal)   Pulse 97   Temp 98.3 F (36.8 C) (Oral)   Wt 166 lb 6 oz (75.5 kg)   SpO2 97%   BMI 29.47 kg/m   BP/Weight 11/21/2015 10/31/2015 4/74/2595  Systolic BP 638 756 433  Diastolic BP 92 295 97  Wt. (Lbs) 166.38 167 166.13  BMI 29.47 29.58 29.44   Physical Exam  Constitutional: She is oriented to person, place, and time. She appears well-developed.  Cardiovascular: Normal rate and regular rhythm.   Pulmonary/Chest:  Effort normal and breath sounds normal.  Neurological: She is alert and oriented to person, place, and time.  Psychiatric: She has a normal mood and affect.  Vitals reviewed.  Lab Results  Component Value Date   WBC 7.5 11/20/2014   HGB 15.0 11/20/2014   HCT 44.2 11/20/2014   PLT 237.0 11/20/2014   GLUCOSE 90 11/20/2014   CHOL 156 11/20/2014   TRIG 166.0 (H) 11/20/2014   HDL 33.40 (L) 11/20/2014   LDLCALC 89 11/20/2014   ALT 25 11/20/2014   AST 27 11/20/2014   NA 137 11/20/2014   K 4.0 11/20/2014   CL 104 11/20/2014   CREATININE 0.52 11/20/2014   BUN 15 11/20/2014   CO2 28 11/20/2014   HGBA1C 5.6 11/20/2014    Assessment & Plan:   Problem List Items Addressed This Visit    Essential hypertension - Primary    Uncontrolled and worsening. Increasing Mavik to 4 mg daily. Adding chlorthalidone. Follow-up in 7-10 days.      Relevant Medications   trandolapril (MAVIK) 4 MG tablet   chlorthalidone (HYGROTON) 25 MG tablet   Other Relevant Orders   Comp Met (CMET)    Other Visit Diagnoses    Blood glucose elevated       Relevant Orders   HgB A1c   Screening,  lipid       Relevant Orders   Lipid Profile   Screening for deficiency anemia       Relevant Orders   CBC      Meds ordered this encounter  Medications  . trandolapril (MAVIK) 4 MG tablet    Sig: Take 1 tablet (4 mg total) by mouth daily.    Dispense:  90 tablet    Refill:  0  . chlorthalidone (HYGROTON) 25 MG tablet    Sig: Take 1 tablet (25 mg total) by mouth daily.    Dispense:  90 tablet    Refill:  0    Follow-up: 7-10 days.  [place]

## 2015-11-21 NOTE — Progress Notes (Signed)
Pre visit review using our clinic review tool, if applicable. No additional management support is needed unless otherwise documented below in the visit note. 

## 2015-11-21 NOTE — Assessment & Plan Note (Signed)
Uncontrolled and worsening. Increasing Mavik to 4 mg daily. Adding chlorthalidone. Follow-up in 7-10 days.

## 2015-11-28 ENCOUNTER — Telehealth: Payer: Self-pay | Admitting: Family Medicine

## 2015-11-28 NOTE — Telephone Encounter (Signed)
Medication was added after last OV on 9/14, please advise, thanks

## 2015-11-28 NOTE — Telephone Encounter (Signed)
Spoke with the patient she verbalized understanding ofyour comments, the symptoms started on Tuesday after she started the new medication and it has progressively gotten worse.  If she tries to move at all she immediately is breaking out in a sweat and her heart is racing, she has to sit or lay down and it takes longer each time to recover.  She stayed home from work today.  Any thoughts?

## 2015-11-28 NOTE — Telephone Encounter (Signed)
Pt called about the medication chlorthalidone (HYGROTON) 25 MG tablet that was prescribed his giving pt some issues. Symptoms are sweating, rasing heart when moving around.  Call pt @ [phone].

## 2015-11-28 NOTE — Telephone Encounter (Signed)
Spoke with the patient, advised of new plan, will call with a update, thanks

## 2015-11-28 NOTE — Telephone Encounter (Signed)
Stop new med. Follow up if fails to improve.

## 2015-11-28 NOTE — Telephone Encounter (Signed)
Unlikely to be the cause.  Give it some time. If it persists, she can discontinue.

## 2015-11-30 ENCOUNTER — Encounter: Payer: Self-pay | Admitting: *Deleted

## 2015-11-30 ENCOUNTER — Emergency Department: Payer: 59

## 2015-11-30 ENCOUNTER — Inpatient Hospital Stay
Admission: EM | Admit: 2015-11-30 | Discharge: 2015-12-01 | DRG: 313 | Disposition: A | Discharge status: Home / Self Care | Payer: 59 | Attending: Internal Medicine | Admitting: Internal Medicine

## 2015-11-30 DIAGNOSIS — Z8261 Family history of arthritis: Secondary | ICD-10-CM | POA: Diagnosis not present

## 2015-11-30 DIAGNOSIS — Z9889 Other specified postprocedural states: Secondary | ICD-10-CM | POA: Diagnosis not present

## 2015-11-30 DIAGNOSIS — T502X5A Adverse effect of carbonic-anhydrase inhibitors, benzothiadiazides and other diuretics, initial encounter: Secondary | ICD-10-CM | POA: Diagnosis present

## 2015-11-30 DIAGNOSIS — Z8249 Family history of ischemic heart disease and other diseases of the circulatory system: Secondary | ICD-10-CM | POA: Diagnosis not present

## 2015-11-30 DIAGNOSIS — E861 Hypovolemia: Secondary | ICD-10-CM | POA: Diagnosis present

## 2015-11-30 DIAGNOSIS — Z87442 Personal history of urinary calculi: Secondary | ICD-10-CM

## 2015-11-30 DIAGNOSIS — Z833 Family history of diabetes mellitus: Secondary | ICD-10-CM | POA: Diagnosis not present

## 2015-11-30 DIAGNOSIS — Z801 Family history of malignant neoplasm of trachea, bronchus and lung: Secondary | ICD-10-CM | POA: Diagnosis not present

## 2015-11-30 DIAGNOSIS — F17210 Nicotine dependence, cigarettes, uncomplicated: Secondary | ICD-10-CM | POA: Diagnosis present

## 2015-11-30 DIAGNOSIS — E871 Hypo-osmolality and hyponatremia: Secondary | ICD-10-CM | POA: Diagnosis not present

## 2015-11-30 DIAGNOSIS — I951 Orthostatic hypotension: Secondary | ICD-10-CM | POA: Diagnosis not present

## 2015-11-30 DIAGNOSIS — Z882 Allergy status to sulfonamides status: Secondary | ICD-10-CM

## 2015-11-30 DIAGNOSIS — Z825 Family history of asthma and other chronic lower respiratory diseases: Secondary | ICD-10-CM

## 2015-11-30 DIAGNOSIS — K219 Gastro-esophageal reflux disease without esophagitis: Secondary | ICD-10-CM | POA: Diagnosis present

## 2015-11-30 DIAGNOSIS — Z87440 Personal history of urinary (tract) infections: Secondary | ICD-10-CM | POA: Diagnosis not present

## 2015-11-30 DIAGNOSIS — R079 Chest pain, unspecified: Secondary | ICD-10-CM | POA: Diagnosis not present

## 2015-11-30 DIAGNOSIS — R002 Palpitations: Secondary | ICD-10-CM | POA: Diagnosis present

## 2015-11-30 DIAGNOSIS — R0789 Other chest pain: Secondary | ICD-10-CM | POA: Diagnosis not present

## 2015-11-30 DIAGNOSIS — Z9049 Acquired absence of other specified parts of digestive tract: Secondary | ICD-10-CM | POA: Diagnosis not present

## 2015-11-30 DIAGNOSIS — R42 Dizziness and giddiness: Secondary | ICD-10-CM

## 2015-11-30 DIAGNOSIS — I10 Essential (primary) hypertension: Secondary | ICD-10-CM | POA: Diagnosis present

## 2015-11-30 DIAGNOSIS — Z79899 Other long term (current) drug therapy: Secondary | ICD-10-CM

## 2015-11-30 DIAGNOSIS — E876 Hypokalemia: Secondary | ICD-10-CM | POA: Diagnosis present

## 2015-11-30 LAB — BASIC METABOLIC PANEL
ANION GAP: 11 (ref 5–15)
ANION GAP: 7 (ref 5–15)
BUN: 15 mg/dL (ref 6–20)
BUN: 13 mg/dL (ref 6–20)
CALCIUM: 9.3 mg/dL (ref 8.9–10.3)
CALCIUM: 8.6 mg/dL — AB (ref 8.9–10.3)
CO2: 28 mmol/L (ref 22–32)
CO2: 27 mmol/L (ref 22–32)
CREATININE: 0.52 mg/dL (ref 0.44–1.00)
Chloride: 86 mmol/L — ABNORMAL LOW (ref 101–111)
Chloride: 99 mmol/L — ABNORMAL LOW (ref 101–111)
Creatinine, Ser: 0.51 mg/dL (ref 0.44–1.00)
GLUCOSE: 110 mg/dL — AB (ref 65–99)
Glucose, Bld: 118 mg/dL — ABNORMAL HIGH (ref 65–99)
POTASSIUM: 3.3 mmol/L — AB (ref 3.5–5.1)
Potassium: 2.4 mmol/L — CL (ref 3.5–5.1)
Sodium: 125 mmol/L — ABNORMAL LOW (ref 135–145)
Sodium: 133 mmol/L — ABNORMAL LOW (ref 135–145)

## 2015-11-30 LAB — CBC
HCT: 48.7 % — ABNORMAL HIGH (ref 35.0–47.0)
HEMATOCRIT: 42.9 % (ref 35.0–47.0)
HEMOGLOBIN: 17.1 g/dL — AB (ref 12.0–16.0)
HEMOGLOBIN: 15.2 g/dL (ref 12.0–16.0)
MCH: 31.8 pg (ref 26.0–34.0)
MCH: 32.4 pg (ref 26.0–34.0)
MCHC: 35.2 g/dL (ref 32.0–36.0)
MCHC: 35.5 g/dL (ref 32.0–36.0)
MCV: 90.3 fL (ref 80.0–100.0)
MCV: 91.1 fL (ref 80.0–100.0)
PLATELETS: 272 10*3/uL (ref 150–440)
Platelets: 244 10*3/uL (ref 150–440)
RBC: 5.4 MIL/uL — AB (ref 3.80–5.20)
RBC: 4.7 MIL/uL (ref 3.80–5.20)
RDW: 12.7 % (ref 11.5–14.5)
RDW: 12.7 % (ref 11.5–14.5)
WBC: 13.4 10*3/uL — ABNORMAL HIGH (ref 3.6–11.0)
WBC: 12.7 10*3/uL — AB (ref 3.6–11.0)

## 2015-11-30 LAB — TROPONIN I: Troponin I: <0.03 ng/mL (ref <0.03)

## 2015-11-30 LAB — MAGNESIUM: MAGNESIUM: 2 mg/dL (ref 1.7–2.4)

## 2015-11-30 MED ORDER — HYDRALAZINE HCL 20 MG/ML IJ SOLN: 10.0000 mg | Freq: Four times a day (QID) | INTRAMUSCULAR | Status: DC | PRN

## 2015-11-30 MED ORDER — ENOXAPARIN SODIUM 40 MG/0.4ML ~~LOC~~ SOLN
40.0000 mg | Freq: Every day | SUBCUTANEOUS | Status: DC
  Administered 2015-11-30: 40 mg via SUBCUTANEOUS
  Filled 2015-11-30: qty 0.4

## 2015-11-30 MED ORDER — POTASSIUM CHLORIDE 10 MEQ/100ML IV SOLN
10.0000 meq | Freq: Once | INTRAVENOUS | Status: AC
  Administered 2015-11-30: 10 meq via INTRAVENOUS
  Filled 2015-11-30 (×2): qty 100

## 2015-11-30 MED ORDER — ACETAMINOPHEN 650 MG RE SUPP: 650.0000 mg | Freq: Four times a day (QID) | RECTAL | Status: DC | PRN

## 2015-11-30 MED ORDER — POTASSIUM CHLORIDE 10 MEQ/100ML IV SOLN
10.0000 meq | Freq: Once | INTRAVENOUS | Status: AC
  Administered 2015-11-30: 10 meq via INTRAVENOUS
  Filled 2015-11-30: qty 100

## 2015-11-30 MED ORDER — TRAMADOL HCL 50 MG PO TABS: 50.0000 mg | ORAL_TABLET | Freq: Four times a day (QID) | ORAL | Status: DC | PRN

## 2015-11-30 MED ORDER — ONDANSETRON HCL 4 MG/2ML IJ SOLN: 4.0000 mg | Freq: Four times a day (QID) | INTRAMUSCULAR | Status: DC | PRN

## 2015-11-30 MED ORDER — ACETAMINOPHEN 325 MG PO TABS
650.0000 mg | ORAL_TABLET | Freq: Four times a day (QID) | ORAL | Status: DC | PRN
  Administered 2015-11-30: 650 mg via ORAL
  Filled 2015-11-30: qty 2

## 2015-11-30 MED ORDER — TRANDOLAPRIL 1 MG PO TABS
4.0000 mg | ORAL_TABLET | Freq: Every day | ORAL | Status: DC
  Administered 2015-11-30 – 2015-12-01 (×2): 4 mg via ORAL
  Filled 2015-11-30: qty 1
  Filled 2015-11-30: qty 4

## 2015-11-30 MED ORDER — POTASSIUM CHLORIDE CRYS ER 20 MEQ PO TBCR
40.0000 meq | EXTENDED_RELEASE_TABLET | Freq: Once | ORAL | Status: AC
  Administered 2015-11-30: 40 meq via ORAL
  Filled 2015-11-30: qty 2

## 2015-11-30 MED ORDER — BUSPIRONE HCL 5 MG PO TABS
7.5000 mg | ORAL_TABLET | Freq: Every day | ORAL | Status: DC
  Administered 2015-11-30: 7.5 mg via ORAL
  Filled 2015-11-30: qty 2

## 2015-11-30 MED ORDER — POTASSIUM CHLORIDE IN NACL 20-0.9 MEQ/L-% IV SOLN
INTRAVENOUS | Status: DC
  Administered 2015-11-30: 18:00:00 via INTRAVENOUS
  Filled 2015-11-30 (×4): qty 1000

## 2015-11-30 MED ORDER — ASPIRIN EC 81 MG PO TBEC
81.0000 mg | DELAYED_RELEASE_TABLET | Freq: Every day | ORAL | Status: DC
  Administered 2015-12-01: 81 mg via ORAL
  Filled 2015-11-30: qty 1

## 2015-11-30 MED ORDER — ONDANSETRON HCL 4 MG PO TABS: 4.0000 mg | ORAL_TABLET | Freq: Four times a day (QID) | ORAL | Status: DC | PRN

## 2015-11-30 MED ORDER — SODIUM CHLORIDE 0.9 % IV BOLUS (SEPSIS)
1000.0000 mL | Freq: Once | INTRAVENOUS | Status: AC
  Administered 2015-11-30: 1000 mL via INTRAVENOUS

## 2015-11-30 MED ORDER — POTASSIUM CHLORIDE CRYS ER 20 MEQ PO TBCR
20.0000 meq | EXTENDED_RELEASE_TABLET | Freq: Two times a day (BID) | ORAL | Status: DC
  Administered 2015-11-30 – 2015-12-01 (×3): 20 meq via ORAL
  Filled 2015-11-30 (×3): qty 1

## 2015-11-30 MED ORDER — SODIUM CHLORIDE 0.9% FLUSH: 3.0000 mL | Freq: Two times a day (BID) | INTRAVENOUS | Status: DC

## 2015-11-30 MED ORDER — ASPIRIN 81 MG PO CHEW
324.0000 mg | CHEWABLE_TABLET | Freq: Once | ORAL | Status: AC
  Administered 2015-11-30: 324 mg via ORAL
  Filled 2015-11-30: qty 4

## 2015-11-30 MED ORDER — NITROGLYCERIN 0.4 MG SL SUBL: 0.4000 mg | SUBLINGUAL_TABLET | SUBLINGUAL | Status: DC | PRN

## 2015-11-30 MED ORDER — PANTOPRAZOLE SODIUM 40 MG PO TBEC
40.0000 mg | DELAYED_RELEASE_TABLET | Freq: Every day | ORAL | Status: DC
  Administered 2015-11-30 – 2015-12-01 (×2): 40 mg via ORAL
  Filled 2015-11-30 (×2): qty 1

## 2015-11-30 NOTE — H&P (Signed)
[facility] [place] NAME: Christina Galvan    MR#:  [id number]  DATE OF BIRTH:  07-Nov-1965  DATE OF ADMISSION:  11/30/2015  PRIMARY CARE PHYSICIAN: Coral Spikes, DO   REQUESTING/REFERRING PHYSICIAN: Dr. Gonzella Lex  CHIEF COMPLAINT:   Chief Complaint  Patient presents with  . Chest Pain    HISTORY OF PRESENT ILLNESS:  Monserrat Galvan  is a 50 y.o. female with a known history of Panic attacks, hypertension, GERD, history of kidney stones, migraines who presented to the hospital due to chest pressure and also palpitations and noted to be severely hypokalemic and hyponatremic. Patient says she went to see her primary care physician last Tuesday and her blood pressure was elevated so her antihypertensive meds were adjusted. Patient had her Mavik dose increased and chlorthalidone was added to her regimen. Patient says shortly after she took those meds she felt dizzy and did not feel like herself. She called her doctor's office back and told they told her to discontinue the chlorthalidone. Today she continued to feel not well and started having some chest pain/pressure and therefore came to the ER for further evaluation. Hospitalist services were contacted further treatment and evaluation.  PAST MEDICAL HISTORY:   Past Medical History:  Diagnosis Date  . Depression   . GERD (gastroesophageal reflux disease)   . History of frequent urinary tract infections   . Hypertension   . Kidney stones   . Migraines     PAST SURGICAL HISTORY:   Past Surgical History:  Procedure Laterality Date  . CESAREAN SECTION  1993  . CHOLECYSTECTOMY  1994    SOCIAL HISTORY:   Social History  Substance Use Topics  . Smoking status: Current Every Day Smoker    Packs/day: 0.50    Years: 30.00    Types: Cigarettes    Start date: 10/21/1984  . Smokeless tobacco: Never Used  . Alcohol use 0.0 oz/week     Comment: Rare    FAMILY HISTORY:   Family  History  Problem Relation Age of Onset  . Arthritis Mother   . Heart disease Mother   . Hypertension Mother   . Diabetes Mother   . Cancer Father     Lung Cancer  . Diabetes Brother   . Asthma Daughter   . Cancer Paternal Uncle     Prostate cancer  . Heart disease Maternal Grandfather   . Heart disease Paternal Grandfather     DRUG ALLERGIES:   Allergies  Allergen Reactions  . Mushroom Extract Complex   . Sulfa Antibiotics     REVIEW OF SYSTEMS:   Review of Systems  Constitutional: Negative for fever and weight loss.  HENT: Negative for congestion, nosebleeds and tinnitus.   Eyes: Negative for blurred vision, double vision and redness.  Respiratory: Negative for cough, hemoptysis and shortness of breath.   Cardiovascular: Positive for chest pain. Negative for orthopnea, leg swelling and PND.  Gastrointestinal: Negative for abdominal pain, diarrhea, melena, nausea and vomiting.  Genitourinary: Negative for dysuria, hematuria and urgency.  Musculoskeletal: Negative for falls and joint pain.  Neurological: Positive for dizziness. Negative for tingling, sensory change, focal weakness, seizures, weakness and headaches.  Endo/Heme/Allergies: Negative for polydipsia. Does not bruise/bleed easily.  Psychiatric/Behavioral: Negative for depression and memory loss. The patient is not nervous/anxious.     MEDICATIONS AT HOME:   Prior to Admission medications   Medication Sig Start Date End Date Taking? Authorizing Provider  busPIRone (BUSPAR)  7.5 MG tablet Take 1 tablet (7.5 mg total) by mouth at bedtime. 06/24/15   Rubbie Battiest, NP  chlorthalidone (HYGROTON) 25 MG tablet Take 1 tablet (25 mg total) by mouth daily. 11/21/15   Coral Spikes, DO  omeprazole (PRILOSEC) 20 MG capsule Take 1 capsule (20 mg total) by mouth daily. 09/04/15   Coral Spikes, DO  traMADol (ULTRAM) 50 MG tablet Take 1 tablet (50 mg total) by mouth every 6 (six) hours as needed. 10/31/15   Coral Spikes, DO   trandolapril (MAVIK) 4 MG tablet Take 1 tablet (4 mg total) by mouth daily. 11/21/15   Coral Spikes, DO      VITAL SIGNS:  Blood pressure 134/83, pulse 95, temperature 98 F (36.7 C), temperature source Oral, resp. rate 10, height 5\' 3"  (1.6 m), weight 75.3 kg (166 lb), SpO2 96 %.  PHYSICAL EXAMINATION:  Physical Exam  GENERAL:  50 y.o.-year-old patient lying in the bed in no acute distress.  EYES: Pupils equal, round, reactive to light and accommodation. No scleral icterus. Extraocular muscles intact.  HEENT: Head atraumatic, normocephalic. Oropharynx and nasopharynx clear. No oropharyngeal erythema, moist oral mucosa  NECK:  Supple, no jugular venous distention. No thyroid enlargement, no tenderness.  LUNGS: Normal breath sounds bilaterally, no wheezing, rales, rhonchi. No use of accessory muscles of respiration.  CARDIOVASCULAR: S1, S2 RRR. No murmurs, rubs, gallops, clicks.  ABDOMEN: Soft, nontender, nondistended. Bowel sounds present. No organomegaly or mass.  EXTREMITIES: No pedal edema, cyanosis, or clubbing. + 2 pedal & radial pulses b/l.   NEUROLOGIC: Cranial nerves II through XII are intact. No focal Motor or sensory deficits appreciated b/l PSYCHIATRIC: The patient is alert and oriented x 3. Good affect.  SKIN: No obvious rash, lesion, or ulcer.   LABORATORY PANEL:   CBC  Recent Labs Lab 11/30/15 1240  WBC 13.4*  HGB 17.1*  HCT 48.7*  PLT 272   ------------------------------------------------------------------------------------------------------------------  Chemistries   Recent Labs Lab 11/30/15 1240  NA 125*  K 2.4*  CL 86*  CO2 28  GLUCOSE 118*  BUN 15  CREATININE 0.52  CALCIUM 9.3  MG 2.0   ------------------------------------------------------------------------------------------------------------------  Cardiac Enzymes  Recent Labs Lab 11/30/15 1240  TROPONINI <0.03    ------------------------------------------------------------------------------------------------------------------  RADIOLOGY:  Dg Chest 2 View  Result Date: 11/30/2015 CLINICAL DATA:  Chest pain EXAM: CHEST  2 VIEW COMPARISON:  None. FINDINGS: 3 mm probable calcified granuloma in the lateral left upper lobe. 6 mm probable calcified granuloma in the posterior left lower lobe. No pleural effusion or pneumothorax. The heart is normal in size. Cholecystectomy clips. Visualized osseous structures are within normal limits. IMPRESSION: No evidence of acute cardiopulmonary disease. Electronically Signed   By: Julian Hy M.D.   On: 11/30/2015 13:08     IMPRESSION AND PLAN:   50 year old female with past medical history of anxiety, panic attacks, hypertension, GERD, ongoing tobacco abuse who presents to the hospital due to chest pain/pressure and also dizziness and noted to be hypokalemic and hyponatremic.  1. Chest pain-patient's chest pain is atypical and reproducible in nature. -She does have risk factors given her ongoing tobacco abuse. EKG shows no acute ST or T-wave changes presently. -Observe on telemetry, cycle cardiac markers. If they turn positive and consider getting a cardiology consult.  2. Hyponatremia-hypovolemic in nature secondary to diuretics -I will hydrate with IV fluids, follow sodium.  3. Hypokalemia-also secondary to diuretics. -I will replace her potassium orally and intravenously. Check level in  the morning. Magnesium level is normal.  4. Essential hypertension-continue Mavik, I will add some as needed hydralazine.  5. GERD-continue Protonix.  6. Anxiety-continue BuSpar.    All the records are reviewed and case discussed with ED provider. Management plans discussed with the patient, family and they are in agreement.  CODE STATUS: Full  Code  TOTAL TIME TAKING CARE OF THIS PATIENT: 45 minutes.    Henreitta Leber M.D on 11/30/2015 at 2:33 PM  Between  7am to 6pm - Pager - [phone]  After 6pm go to [email or web address] - password [email or web address] [facility]  Office  [phone]  CC: Primary care physician; Coral Spikes, DO

## 2015-11-30 NOTE — ED Provider Notes (Signed)
[facility] Emergency Department Provider Note  ____________________________________________  Time seen: Approximately 1:07 PM  I have reviewed the triage vital signs and the nursing notes.   HISTORY  Chief Complaint Chest Pain   HPI Christina Galvan is a 50 y.o. female with a history of hypertension who presents for evaluation of chest pain. Patient reports that she had a regular checkup with her primary care doctor in 4 days ago. During that visit she was found to be hypertensive to 160s. She had her trandolapril increased from 1 mg to 4 mg daily and also had chlorthalidone started. She reports that the next day she started the new medications and since then she has felt unwell. She reports that she feels extremely nauseated, has multiple episodes of chest discomfort and diaphoresis, generalized weakness. She reports yesterday she could barely get up from the bed. She called her primary care doctor who told her to discontinue the chlorthalidone which she did 24 hours ago. She reports that she did not take any of her medications this morning. She reports multiple episodes of lightheadedness and feeling like she is going to pass out. She reports that the chest pain got worse since last night and she has had the sensation of something sitting on her chest intermittently since last night. She reports that she feels dizzy, diaphoretic, and his chest pressure comes on. These episodes last about 5 minutes and resolved with no intervention. They're worse with exertion. She has a strong family history of ischemic heart disease in both maternal and paternal sides of her family. She is a smoker. She denies ever having a stress test or any personal history of ischemic heart disease. No Cp at this time.  Past Medical History:  Diagnosis Date  . Depression   . GERD (gastroesophageal reflux disease)   . History of frequent urinary tract infections   . Hypertension   . Kidney  stones   . Migraines     Patient Active Problem List   Diagnosis Date Noted  . Chronic back pain 10/31/2015  . Tobacco abuse 10/31/2015  . Essential hypertension 10/31/2015  . Depression with anxiety 10/28/2014  . GERD (gastroesophageal reflux disease) 10/28/2014  . Migraines 10/28/2014  . Panic attack 10/28/2014    Past Surgical History:  Procedure Laterality Date  . CESAREAN SECTION  1993  . CHOLECYSTECTOMY  1994    Prior to Admission medications   Medication Sig Start Date End Date Taking? Authorizing Provider  busPIRone (BUSPAR) 7.5 MG tablet Take 1 tablet (7.5 mg total) by mouth at bedtime. 06/24/15   Rubbie Battiest, NP  chlorthalidone (HYGROTON) 25 MG tablet Take 1 tablet (25 mg total) by mouth daily. 11/21/15   Coral Spikes, DO  omeprazole (PRILOSEC) 20 MG capsule Take 1 capsule (20 mg total) by mouth daily. 09/04/15   Coral Spikes, DO  traMADol (ULTRAM) 50 MG tablet Take 1 tablet (50 mg total) by mouth every 6 (six) hours as needed. 10/31/15   Coral Spikes, DO  trandolapril (MAVIK) 4 MG tablet Take 1 tablet (4 mg total) by mouth daily. 11/21/15   Coral Spikes, DO    Allergies Mushroom extract complex and Sulfa antibiotics  Family History  Problem Relation Age of Onset  . Arthritis Mother   . Heart disease Mother   . Hypertension Mother   . Diabetes Mother   . Cancer Father     Lung Cancer  . Diabetes Brother   . Asthma  Daughter   . Cancer Paternal Uncle     Prostate cancer  . Heart disease Maternal Grandfather   . Heart disease Paternal Grandfather     Social History Social History  Substance Use Topics  . Smoking status: Current Every Day Smoker    Packs/day: 0.25    Types: Cigarettes    Start date: 10/21/1984  . Smokeless tobacco: Never Used  . Alcohol use 0.0 oz/week     Comment: Rare    Review of Systems  Constitutional: Negative for fever. + generalized weakness and lightheadedness Eyes: Negative for visual changes. ENT: Negative for sore  throat. Cardiovascular: + chest pain, diaphoresis Respiratory: Negative for shortness of breath. Gastrointestinal: Negative for abdominal pain, vomiting or diarrhea. + nausea Genitourinary: Negative for dysuria. Musculoskeletal: Negative for back pain. Skin: Negative for rash. Neurological: Negative for headaches, weakness or numbness.  ____________________________________________   PHYSICAL EXAM:  VITAL SIGNS: ED Triage Vitals  Enc Vitals Group     BP 11/30/15 1237 (!) 146/97     Pulse Rate 11/30/15 1237 (!) 111     Resp 11/30/15 1237 20     Temp 11/30/15 1237 98 F (36.7 C)     Temp Source 11/30/15 1237 Oral     SpO2 11/30/15 1237 98 %     Weight 11/30/15 1238 166 lb (75.3 kg)     Height 11/30/15 1238 5\' 3"  (1.6 m)     Head Circumference --      Peak Flow --      Pain Score --      Pain Loc --      Pain Edu? --      Excl. in [place]? --     Constitutional: Alert and oriented. Well appearing and in no apparent distress. HEENT:      Head: Normocephalic and atraumatic.         Eyes: Conjunctivae are normal. Sclera is non-icteric. EOMI. PERRL      Mouth/Throat: Mucous membranes are dry.       Neck: Supple with no signs of meningismus. Cardiovascular: Tachycardic with regular rhythm. No murmurs, gallops, or rubs. 2+ symmetrical distal pulses are present in all extremities. No JVD. Respiratory: Normal respiratory effort. Lungs are clear to auscultation bilaterally. No wheezes, crackles, or rhonchi.  Gastrointestinal: Soft, non tender, and non distended with positive bowel sounds. No rebound or guarding. Genitourinary: No CVA tenderness. Musculoskeletal: Nontender with normal range of motion in all extremities. No edema, cyanosis, or erythema of extremities. Neurologic: Normal speech and language. Face is symmetric. Moving all extremities. No gross focal neurologic deficits are appreciated. Skin: Skin is warm, dry and intact. No rash noted. Psychiatric: Mood and affect are  normal. Speech and behavior are normal.  ____________________________________________   LABS (all labs ordered are listed, but only abnormal results are displayed)  Labs Reviewed  BASIC METABOLIC PANEL - Abnormal; Notable for the following:       Result Value   Sodium 125 (*)    Potassium 2.4 (*)    Chloride 86 (*)    Glucose, Bld 118 (*)    All other components within normal limits  CBC - Abnormal; Notable for the following:    WBC 13.4 (*)    RBC 5.40 (*)    Hemoglobin 17.1 (*)    HCT 48.7 (*)    All other components within normal limits  TROPONIN I  MAGNESIUM   ____________________________________________  EKG  ED ECG REPORT I, Rudene Re, the attending physician, personally  viewed and interpreted this ECG.  Sinus tachycardia, rate of 116, normal intervals, right axis deviation, no ST elevations or depressions. No prior for comparison. ____________________________________________  RADIOLOGY  CXR: Negative ____________________________________________   PROCEDURES  Procedure(s) performed: None Procedures Critical Care performed:  None ____________________________________________   INITIAL IMPRESSION / ASSESSMENT AND PLAN / ED COURSE   50 y.o. female with a history of hypertension who presents for evaluation of chest pain, diaphoresis, lightheadedness, and generalized weakness since having her antihypertensive meds increased. Patient has not taken any meds today. EKG with no evidence of ischemia, patient looks dry on exam. Vital signs showing + orthostatic. Ddx including side effect of the medication, electrolyte abnormalities, dehydration, and obviously ACS with a strong family history of ischemic heart disease history of smoking and description of her pain as a pressure in her chest associated with diaphoresis and lightheadedness and nausea. We'll watch patient on telemetry and give her a full dose of aspirin, we'll give her IV fluids for hydration. No chest  pain at this time and BP in the 130s so will hold off nitro. We'll check electrolytes, kidney function, troponin, CXR. Will get orthostatic VS.  Clinical Course  Comment By Time  Patient is orthostatic +, K 2.4 initiate replacement. Hyponatremia with dehydration, IVF given. 1st troponin is negative. Will admit to hospitalist Rudene Re, MD 09/23 1355    Pertinent labs & imaging results that were available during my care of the patient were reviewed by me and considered in my medical decision making (see chart for details).    ____________________________________________   FINAL CLINICAL IMPRESSION(S) / ED DIAGNOSES  Final diagnoses:  Chest pain, unspecified chest pain type  Hypokalemia  Hyponatremia  Orthostatic dizziness      NEW MEDICATIONS STARTED DURING THIS VISIT:  New Prescriptions   No medications on file     Note:  This document was prepared using Dragon voice recognition software and may include unintentional dictation errors.    Rudene Re, MD 11/30/15 1357

## 2015-11-30 NOTE — ED Triage Notes (Signed)
Pt complains of chest pain starting yesterday, pt reports PCP changed blood pressure medications Tuesday, pt reports  Dyspnea on exertion and sweating at times, pt in no acute distress during triage

## 2015-12-01 NOTE — Progress Notes (Signed)
Pt to be discharge d  Today. Iv and tele removed. Pt has ambulated at length in hallway and tolerated it well. disch instructions and work excuse given to pt to her understanding. Pt disch via w.c. Accompanied by mother.

## 2015-12-01 NOTE — Progress Notes (Signed)
   [address], [place] [address]  December 01, 2015  Patient:  [name] Date of Birth: 1965-10-06 Date of Visit:  11/30/2015  To Whom it May Concern:  Please excuse PALMA PAULHAMUS from work from 11/30/2015 until 12/01/15 as she was admitted to the [facility] for medical treatment and has been receiving appropriate care. She may return to work on 12/02/15, sooner if she feels she is able to return sooner than this date.      Please don't hesitate to contact me with questions or concerns by calling  [phone] and asking them to page me directly.   Regino Schultze, MD

## 2015-12-01 NOTE — Discharge Summary (Signed)
[place] [facility] [place] NAME: Christina Galvan    MR#:  [id number]  DATE OF BIRTH:  [birth year]  DATE OF ADMISSION:  11/30/2015 ADMITTING PHYSICIAN: Henreitta Leber, MD  DATE OF DISCHARGE: 12/01/15  PRIMARY CARE PHYSICIAN: Coral Spikes, DO    ADMISSION DIAGNOSIS:  Hypokalemia [E87.6] Hyponatremia [E87.1] Orthostatic dizziness [R42] Chest pain, unspecified chest pain type [R07.9]  DISCHARGE DIAGNOSIS:  Active Problems:   Chest pain  hypokalemia  SECONDARY DIAGNOSIS:   Past Medical History:  Diagnosis Date  . Depression   . GERD (gastroesophageal reflux disease)   . History of frequent urinary tract infections   . Hypertension   . Kidney stones   . Migraines     HOSPITAL COURSE:  Christina Galvan  is a 50 y.o. female admitted 11/30/2015 with chief complaint Chest Pain . Please see H&P performed by Henreitta Leber, MD for further information. Patient presented with chest pain and fatigue after increase in BP medication -trandolapril. Ruled out for cardiac etiology - enzymes normal. Noted low K on admission - replaced.  Symptom of fatigue likely related too increase in medications  DISCHARGE CONDITIONS:   stble  CONSULTS OBTAINED:    DRUG ALLERGIES:   Allergies  Allergen Reactions  . Mushroom Extract Complex   . Sulfa Antibiotics     DISCHARGE MEDICATIONS:   Current Discharge Medication List    CONTINUE these medications which have NOT CHANGED   Details  busPIRone (BUSPAR) 7.5 MG tablet Take 1 tablet (7.5 mg total) by mouth at bedtime. Qty: 90 tablet, Refills: 3    omeprazole (PRILOSEC) 20 MG capsule Take 1 capsule (20 mg total) by mouth daily. Qty: 30 capsule, Refills: 3    traMADol (ULTRAM) 50 MG tablet Take 1 tablet (50 mg total) by mouth every 6 (six) hours as needed. Qty: 90 tablet, Refills: 2    trandolapril (MAVIK) 4 MG tablet Take 1 tablet (4 mg total) by mouth daily. Qty: 90 tablet, Refills: 0        STOP taking these medications     chlorthalidone (HYGROTON) 25 MG tablet          DISCHARGE INSTRUCTIONS:    DIET:  Cardiac diet  DISCHARGE CONDITION:  Good  ACTIVITY:  Activity as tolerated  OXYGEN:  Home Oxygen: No.   Oxygen Delivery: room air  DISCHARGE LOCATION:  home   If you experience worsening of your admission symptoms, develop shortness of breath, life threatening emergency, suicidal or homicidal thoughts you must seek medical attention immediately by calling 911 or calling your MD immediately  if symptoms less severe.  You Must read complete instructions/literature along with all the possible adverse reactions/side effects for all the Medicines you take and that have been prescribed to you. Take any new Medicines after you have completely understood and accpet all the possible adverse reactions/side effects.   Please note  You were cared for by a hospitalist during your hospital stay. If you have any questions about your discharge medications or the care you received while you were in the hospital after you are discharged, you can call the unit and asked to speak with the hospitalist on call if the hospitalist that took care of you is not available. Once you are discharged, your primary care physician will handle any further medical issues. Please note that NO REFILLS for any discharge medications will be authorized once you are discharged, as it is imperative that you return to  your primary care physician (or establish a relationship with a primary care physician if you do not have one) for your aftercare needs so that they can reassess your need for medications and monitor your lab values.    On the day of Discharge:   VITAL SIGNS:  Blood pressure (P) 125/77, pulse (P) 95, temperature (P) 98.4 F (36.9 C), temperature source (P) Oral, resp. rate 18, height 5\' 3"  (1.6 m), weight 72.5 kg (159 lb 12.8 oz), SpO2 100 %.  I/O:   Intake/Output Summary (Last 24  hours) at 12/01/15 1016 Last data filed at 12/01/15 0459  Gross per 24 hour  Intake          1781.25 ml  Output              850 ml  Net           931.25 ml    PHYSICAL EXAMINATION:  GENERAL:  49 y.o.-year-old patient lying in the bed with no acute distress.  EYES: Pupils equal, round, reactive to light and accommodation. No scleral icterus. Extraocular muscles intact.  HEENT: Head atraumatic, normocephalic. Oropharynx and nasopharynx clear.  NECK:  Supple, no jugular venous distention. No thyroid enlargement, no tenderness.  LUNGS: Normal breath sounds bilaterally, no wheezing, rales,rhonchi or crepitation. No use of accessory muscles of respiration.  CARDIOVASCULAR: S1, S2 normal. No murmurs, rubs, or gallops.  ABDOMEN: Soft, non-tender, non-distended. Bowel sounds present. No organomegaly or mass.  EXTREMITIES: No pedal edema, cyanosis, or clubbing.  NEUROLOGIC: Cranial nerves II through XII are intact. Muscle strength 5/5 in all extremities. Sensation intact. Gait not checked.  PSYCHIATRIC: The patient is alert and oriented x 3.  SKIN: No obvious rash, lesion, or ulcer.   DATA REVIEW:   CBC  Recent Labs Lab 11/30/15 2307  WBC 12.7*  HGB 15.2  HCT 42.9  PLT 244    Chemistries   Recent Labs Lab 11/30/15 1240 11/30/15 2307  NA 125* 133*  K 2.4* 3.3*  CL 86* 99*  CO2 28 27  GLUCOSE 118* 110*  BUN 15 13  CREATININE 0.52 0.51  CALCIUM 9.3 8.6*  MG 2.0  --     Cardiac Enzymes  Recent Labs Lab 11/30/15 2307  TROPONINI <0.03    Microbiology Results  No results found for this or any previous visit.  RADIOLOGY:  Dg Chest 2 View  Result Date: 11/30/2015 CLINICAL DATA:  Chest pain EXAM: CHEST  2 VIEW COMPARISON:  None. FINDINGS: 3 mm probable calcified granuloma in the lateral left upper lobe. 6 mm probable calcified granuloma in the posterior left lower lobe. No pleural effusion or pneumothorax. The heart is normal in size. Cholecystectomy clips. Visualized  osseous structures are within normal limits. IMPRESSION: No evidence of acute cardiopulmonary disease. Electronically Signed   By: Julian Hy M.D.   On: 11/30/2015 13:08     Management plans discussed with the patient, family and they are in agreement.  CODE STATUS:     Code Status Orders        Start     Ordered   11/30/15 1535  Full code  Continuous     11/30/15 1534    Code Status History    Date Active Date Inactive Code Status Order ID Comments User Context   This patient has a current code status but no historical code status.      TOTAL TIME TAKING CARE OF THIS PATIENT: 33 minutes.    Sahan Pen,  Karenann Cai.D  on 12/01/2015 at 10:16 AM  Between 7am to 6pm - Pager - [phone]  After 6pm go to [email or web address] - Proofreader  [employer] [place] [facility]  Office  [phone]  CC: Primary care physician; Coral Spikes, DO

## 2015-12-02 ENCOUNTER — Telehealth: Payer: Self-pay

## 2015-12-02 NOTE — Telephone Encounter (Signed)
Unable to reach patient.  Unable to leave a message.  Will call again tomorrow morning 12/03/15 and follow as appropriate with transitional care management.

## 2015-12-03 ENCOUNTER — Telehealth: Payer: Self-pay

## 2015-12-03 NOTE — Telephone Encounter (Signed)
Unable to reach patient.  Second attempt to follow up with transitional care management.  No answer.  No voicemail.  Hospital follow up scheduled 12/09/15 at 11:30.  Will continue to follow as appropriate.

## 2015-12-04 ENCOUNTER — Telehealth: Payer: Self-pay

## 2015-12-04 NOTE — Telephone Encounter (Signed)
Unable to reach patient. Will continue to follow with transitional care management.

## 2015-12-09 ENCOUNTER — Ambulatory Visit (INDEPENDENT_AMBULATORY_CARE_PROVIDER_SITE_OTHER): Payer: 59 | Admitting: Family Medicine

## 2015-12-09 ENCOUNTER — Encounter: Payer: Self-pay | Admitting: Family Medicine

## 2015-12-09 DIAGNOSIS — E876 Hypokalemia: Secondary | ICD-10-CM | POA: Diagnosis not present

## 2015-12-09 DIAGNOSIS — Z09 Encounter for follow-up examination after completed treatment for conditions other than malignant neoplasm: Secondary | ICD-10-CM

## 2015-12-09 DIAGNOSIS — R0789 Other chest pain: Secondary | ICD-10-CM | POA: Diagnosis not present

## 2015-12-09 NOTE — Patient Instructions (Signed)
Continue your meds.  Once again, I'm sorry.  Take care  Dr. Lacinda Axon

## 2015-12-09 NOTE — Assessment & Plan Note (Signed)
Doing well. Resolved now. Medications reviewed and reconciled today. Thiazide diuretics added to patient's allergy list.

## 2015-12-09 NOTE — Progress Notes (Signed)
Pre visit review using our clinic review tool, if applicable. No additional management support is needed unless otherwise documented below in the visit note. 

## 2015-12-09 NOTE — Progress Notes (Signed)
Subjective:  Patient ID: Christina Galvan, female    DOB: [birth year]  Age: 50 y.o. MRN: [id number]  CC: Hospital Follow Up  HPI:  50 year old female with HTN, Depression/Anxiety, Tobacco abuse presents for hospital follow up.  Patient was recently admitted from 9/23 - 9/24. Her hospital course and discharge summary were reviewed and are summarized as follows: Patient was recent seen and had uncontrolled hypertension. I started her on chlorthalidone. Patient subsequently developed side effects - sweating, heart racing. She called the office and was encouraged to discontinue. Patient continued to feel poorly after cessation. She subsequently developed chest pain in addition to her other symptoms. She then presented to the ED. She was found to be hyponatremic, hypokalemic and clinically dehydrated. Patient had a negative cardiac workup. She was given IV fluids and potassium supplementation and was discharged home in stable condition the following day.  Patient presents today for follow-up. She states that she is doing well. She's had no further chest pain or any other issues. Additionally, she states that her blood pressure has been well controlled at home. She is compliant with her medications. No other complaints or concerns at this time.  Social Hx   Social History   Social History  . Marital status: Single    Spouse name: N/A  . Number of children: N/A  . Years of education: N/A   Social History Main Topics  . Smoking status: Current Every Day Smoker    Packs/day: 0.50    Years: 30.00    Types: Cigarettes    Start date: 10/21/1984  . Smokeless tobacco: Never Used  . Alcohol use 0.0 oz/week     Comment: Rare  . Drug use: No  . Sexual activity: Not Currently    Partners: Male    Birth control/ protection: IUD   Other Topics Concern  . None   Social History Narrative   Works at [employer] with son and daughter   Pets: None   Caffeine- 1 20 oz bottle of soda     Review of Systems  Constitutional: Negative.   Respiratory:       Chest pain resolved.   Objective:  BP (!) 142/96 (BP Location: Right Arm, Patient Position: Sitting, Cuff Size: Normal)   Pulse 95   Temp 98.1 F (36.7 C) (Oral)   Wt 164 lb 8 oz (74.6 kg)   SpO2 97%   BMI 29.14 kg/m   BP/Weight 12/09/2015 12/01/2015 99991111  Systolic BP [id number] -  Diastolic BP 96 63 -  Wt. (Lbs) 164.5 - 159.8  BMI 29.14 - 28.31   Physical Exam  Constitutional: She is oriented to person, place, and time. She appears well-developed. No distress.  Cardiovascular: Normal rate and regular rhythm.   Pulmonary/Chest: Effort normal. She has no wheezes. She has no rales.  Neurological: She is alert and oriented to person, place, and time.  Psychiatric: She has a normal mood and affect.  Vitals reviewed.  Lab Results  Component Value Date   WBC 12.7 (H) 11/30/2015   HGB 15.2 11/30/2015   HCT 42.9 11/30/2015   PLT 244 11/30/2015   GLUCOSE 110 (H) 11/30/2015   CHOL 167 11/21/2015   TRIG 130.0 11/21/2015   HDL 31.30 (L) 11/21/2015   LDLCALC 109 (H) 11/21/2015   ALT 21 11/21/2015   AST 23 11/21/2015   NA 133 (L) 11/30/2015   K 3.3 (L) 11/30/2015   CL 99 (L) 11/30/2015  CREATININE 0.51 11/30/2015   BUN 13 11/30/2015   CO2 27 11/30/2015   HGBA1C 5.5 11/21/2015   Assessment & Plan:   Problem List Items Addressed This Visit    Chest pain    Resolved following electrolyte repletion and discontinuation of chlorthalidone.      Hypokalemia    Doing well. Resolved now. Medications reviewed and reconciled today. Thiazide diuretics added to patient's allergy list.       Other Visit Diagnoses   None.     Follow-up: PRN  [place]

## 2015-12-09 NOTE — Assessment & Plan Note (Signed)
Resolved following electrolyte repletion and discontinuation of chlorthalidone.

## 2015-12-13 MED FILL — traMADol HCL 50 MG TABS: 50 | 22 days supply | Qty: 90 | Fill #1 | Status: TO

## 2016-01-23 ENCOUNTER — Other Ambulatory Visit: Payer: Self-pay | Admitting: Family Medicine

## 2016-02-04 ENCOUNTER — Other Ambulatory Visit: Payer: Self-pay | Admitting: Family Medicine

## 2016-02-04 ENCOUNTER — Telehealth: Payer: Self-pay | Admitting: *Deleted

## 2016-02-04 DIAGNOSIS — E876 Hypokalemia: Secondary | ICD-10-CM

## 2016-02-04 NOTE — Telephone Encounter (Signed)
Patient requested lab orders, to check her potassium. She stated that she's starting to feel sluggish. Pt reported that she had this feeling when her potassium dropped before.  Pt contact [phone]

## 2016-02-04 NOTE — Telephone Encounter (Signed)
Please advise 

## 2016-02-04 NOTE — Telephone Encounter (Signed)
Can schedule lab appt. 

## 2016-02-04 NOTE — Telephone Encounter (Signed)
Can you please schedule lab appointment

## 2016-02-04 NOTE — Telephone Encounter (Signed)
Pt is scheduled for tomorrow 

## 2016-02-05 ENCOUNTER — Other Ambulatory Visit (INDEPENDENT_AMBULATORY_CARE_PROVIDER_SITE_OTHER): Payer: 59

## 2016-02-05 DIAGNOSIS — E876 Hypokalemia: Secondary | ICD-10-CM

## 2016-02-05 LAB — BASIC METABOLIC PANEL
BUN: 14 mg/dL (ref 6–23)
CO2: 28 meq/L (ref 19–32)
Calcium: 9.2 mg/dL (ref 8.4–10.5)
Chloride: 105 mEq/L (ref 96–112)
Creatinine, Ser: 0.51 mg/dL (ref 0.40–1.20)
GFR: 135.23 mL/min (ref >60.00)
GLUCOSE: 75 mg/dL (ref 70–99)
POTASSIUM: 3.7 meq/L (ref 3.5–5.1)
SODIUM: 140 meq/L (ref 135–145)

## 2016-02-06 ENCOUNTER — Encounter: Payer: Self-pay | Admitting: Family Medicine

## 2016-02-25 ENCOUNTER — Other Ambulatory Visit: Payer: Self-pay | Admitting: Family Medicine

## 2016-02-25 MED FILL — TRANDOLAPRIL 4 MG TABLET: 4 | 90 days supply | Qty: 90 | Fill #0

## 2016-03-04 ENCOUNTER — Other Ambulatory Visit: Payer: Self-pay | Admitting: Family Medicine

## 2016-03-04 NOTE — Telephone Encounter (Signed)
Last filled 01/23/16. Last OV 12/09/15. No follow up on file.

## 2016-03-05 MED FILL — traMADol HCL 50 MG TABS: 50 | 22 days supply | Qty: 90 | Fill #0

## 2016-03-05 NOTE — Telephone Encounter (Signed)
faxed

## 2016-04-21 ENCOUNTER — Other Ambulatory Visit: Payer: Self-pay | Admitting: Family Medicine

## 2016-04-21 MED FILL — OMEPRAZOLE 20 MG CAPSULE DR: 20 | 90 days supply | Qty: 90 | Fill #0

## 2016-04-21 MED FILL — traMADol HCL 50 MG TABS: 50 | 22 days supply | Qty: 90 | Fill #0

## 2016-04-21 NOTE — Telephone Encounter (Signed)
faxed

## 2016-04-21 NOTE — Telephone Encounter (Signed)
Refilled 03/05/2016. Last office visit 10/31/2015. Does not have an appt scheduled.

## 2016-05-25 DIAGNOSIS — Z6827 Body mass index (BMI) 27.0-27.9, adult: Secondary | ICD-10-CM | POA: Diagnosis not present

## 2016-05-25 DIAGNOSIS — Z01419 Encounter for gynecological examination (general) (routine) without abnormal findings: Secondary | ICD-10-CM | POA: Diagnosis not present

## 2016-05-25 MED FILL — TRANDOLAPRIL 4 MG TABLET: 4 | 90 days supply | Qty: 90 | Fill #1

## 2016-06-11 ENCOUNTER — Other Ambulatory Visit: Payer: Self-pay | Admitting: Family Medicine

## 2016-06-11 NOTE — Telephone Encounter (Signed)
Refilled: 04/21/16 Last OV: 12/09/15 Last Labs: 11/30/15 Future OV: none Please advise?

## 2016-06-12 MED FILL — traMADol HCL 50 MG TABS: 50 | 22 days supply | Qty: 90 | Fill #0

## 2016-06-12 NOTE — Telephone Encounter (Signed)
faxed

## 2016-06-15 DIAGNOSIS — R1032 Left lower quadrant pain: Secondary | ICD-10-CM | POA: Diagnosis not present

## 2016-06-15 DIAGNOSIS — N83201 Unspecified ovarian cyst, right side: Secondary | ICD-10-CM | POA: Diagnosis not present

## 2016-07-23 MED FILL — OMEPRAZOLE 20 MG CAPSULE DR: 20 | 90 days supply | Qty: 90 | Fill #1

## 2016-08-06 ENCOUNTER — Telehealth: Payer: Self-pay | Admitting: Family Medicine

## 2016-08-06 NOTE — Telephone Encounter (Addendum)
Last filled 06/12/16 No office visit scheduled lov 10/217 Please advise

## 2016-08-07 NOTE — Telephone Encounter (Signed)
Left voicemail to call back on cell phone.

## 2016-08-07 NOTE — Telephone Encounter (Signed)
Notified [place] patient needs appointment

## 2016-08-10 DIAGNOSIS — N83201 Unspecified ovarian cyst, right side: Secondary | ICD-10-CM | POA: Diagnosis not present

## 2016-08-10 NOTE — Telephone Encounter (Signed)
Pt called back stating she received a call from you. Thank you!

## 2016-08-10 NOTE — Telephone Encounter (Signed)
Left voice mail to call back 

## 2016-08-12 NOTE — Telephone Encounter (Signed)
Patient scheduled for appointment 08/24/16

## 2016-08-24 ENCOUNTER — Encounter: Payer: Self-pay | Admitting: Family Medicine

## 2016-08-24 ENCOUNTER — Ambulatory Visit (INDEPENDENT_AMBULATORY_CARE_PROVIDER_SITE_OTHER): Payer: 59 | Admitting: Family Medicine

## 2016-08-24 VITALS — BP 142/88 | HR 83 | Temp 98.6°F | Resp 12 | Ht 63.0 in | Wt 158.0 lb

## 2016-08-24 DIAGNOSIS — Z130 Encounter for screening for diseases of the blood and blood-forming organs and certain disorders involving the immune mechanism: Secondary | ICD-10-CM

## 2016-08-24 DIAGNOSIS — Z1231 Encounter for screening mammogram for malignant neoplasm of breast: Secondary | ICD-10-CM | POA: Diagnosis not present

## 2016-08-24 DIAGNOSIS — F410 Panic disorder [episodic paroxysmal anxiety] without agoraphobia: Secondary | ICD-10-CM

## 2016-08-24 DIAGNOSIS — E785 Hyperlipidemia, unspecified: Secondary | ICD-10-CM | POA: Diagnosis not present

## 2016-08-24 DIAGNOSIS — Z1211 Encounter for screening for malignant neoplasm of colon: Secondary | ICD-10-CM | POA: Diagnosis not present

## 2016-08-24 DIAGNOSIS — M545 Low back pain: Secondary | ICD-10-CM | POA: Diagnosis not present

## 2016-08-24 DIAGNOSIS — R739 Hyperglycemia, unspecified: Secondary | ICD-10-CM

## 2016-08-24 DIAGNOSIS — Z23 Encounter for immunization: Secondary | ICD-10-CM | POA: Diagnosis not present

## 2016-08-24 DIAGNOSIS — I10 Essential (primary) hypertension: Secondary | ICD-10-CM

## 2016-08-24 DIAGNOSIS — G8929 Other chronic pain: Secondary | ICD-10-CM | POA: Diagnosis not present

## 2016-08-24 DIAGNOSIS — Z1239 Encounter for other screening for malignant neoplasm of breast: Secondary | ICD-10-CM

## 2016-08-24 LAB — CBC
HEMATOCRIT: 43.8 % (ref 36.0–46.0)
HEMOGLOBIN: 14.7 g/dL (ref 12.0–15.0)
MCHC: 33.5 g/dL (ref 30.0–36.0)
MCV: 94.8 fl (ref 78.0–100.0)
Platelets: 264 10*3/uL (ref 150.0–400.0)
RBC: 4.62 Mil/uL (ref 3.87–5.11)
RDW: 13.5 % (ref 11.5–15.5)
WBC: 7.4 10*3/uL (ref 4.0–10.5)

## 2016-08-24 LAB — LIPID PANEL
CHOL/HDL RATIO: 6
Cholesterol: 177 mg/dL (ref 0–200)
HDL: 32.2 mg/dL — AB (ref >39.00)
LDL Cholesterol: 121 mg/dL — ABNORMAL HIGH (ref 0–99)
NONHDL: 144.92
Triglycerides: 120 mg/dL (ref 0.0–149.0)
VLDL: 24 mg/dL (ref 0.0–40.0)

## 2016-08-24 LAB — COMPREHENSIVE METABOLIC PANEL
ALT: 10 U/L (ref 0–35)
AST: 15 U/L (ref 0–37)
Albumin: 4.2 g/dL (ref 3.5–5.2)
Alkaline Phosphatase: 59 U/L (ref 39–117)
BUN: 16 mg/dL (ref 6–23)
CO2: 27 meq/L (ref 19–32)
Calcium: 9.4 mg/dL (ref 8.4–10.5)
Chloride: 106 mEq/L (ref 96–112)
Creatinine, Ser: 0.56 mg/dL (ref 0.40–1.20)
GFR: 121.13 mL/min (ref >60.00)
GLUCOSE: 87 mg/dL (ref 70–99)
POTASSIUM: 3.3 meq/L — AB (ref 3.5–5.1)
SODIUM: 139 meq/L (ref 135–145)
TOTAL PROTEIN: 6.8 g/dL (ref 6.0–8.3)
Total Bilirubin: 0.3 mg/dL (ref 0.2–1.2)

## 2016-08-24 LAB — HEMOGLOBIN A1C: HEMOGLOBIN A1C: 5.9 % (ref 4.6–6.5)

## 2016-08-24 MED ORDER — BUSPIRONE HCL 7.5 MG PO TABS: 7.5000 mg | ORAL_TABLET | Freq: Every day | ORAL | 3 refills | Status: DC

## 2016-08-24 MED ORDER — TRAMADOL HCL 50 MG PO TABS: 50.0000 mg | ORAL_TABLET | Freq: Four times a day (QID) | ORAL | 1 refills | Status: DC | PRN

## 2016-08-24 NOTE — Assessment & Plan Note (Signed)
Rechecking lipid panel today. Patient at high risk given hypertension and tobacco abuse.

## 2016-08-24 NOTE — Progress Notes (Signed)
Subjective:  Patient ID: Christina Galvan, female    DOB: Apr 29, 1965  Age: 51 y.o. MRN: [id number]  CC: Follow up  HPI:  51 year old female with hypertension, hyperlipidemia, tobacco abuse, chronic back pain, anxiety presents for follow-up.  HTN  At goal (home readings well controlled) on [place].  HLD  Most recent LDL 109.  Needs labs today.  No current on treatment. Elevated risk in setting of HTN and Tobacco abuse.  Chronic back pain  Currently experiencing significant pain secondary to being out of her tramadol. She was doing well on tramadol previously. Needs refill.  Anxiety/panic  Fairly well controlled.  Needs refill on Buspar.  Social Hx   Social History   Social History  . Marital status: Single    Spouse name: N/A  . Number of children: N/A  . Years of education: N/A   Social History Main Topics  . Smoking status: Current Every Day Smoker    Packs/day: 0.50    Years: 30.00    Types: Cigarettes    Start date: 10/21/1984  . Smokeless tobacco: Never Used  . Alcohol use 0.0 oz/week     Comment: Rare  . Drug use: No  . Sexual activity: Not Currently    Partners: Male    Birth control/ protection: IUD   Other Topics Concern  . None   Social History Narrative   Works at [employer] with son and daughter   Pets: None   Caffeine- 1 20 oz bottle of soda     Review of Systems  Constitutional: Negative.   Respiratory: Negative.   Cardiovascular: Negative.    Objective:  BP (!) 142/88 (BP Location: Left Arm, Patient Position: Sitting, Cuff Size: Large)   Pulse 83   Temp 98.6 F (37 C) (Oral)   Resp 12   Ht 5\' 3"  (1.6 m)   Wt 158 lb (71.7 kg)   SpO2 97%   BMI 27.99 kg/m   BP/Weight 08/24/2016 12/09/2015 10/17/9145  Systolic BP 829 562 130  Diastolic BP 88 96 63  Wt. (Lbs) 158 164.5 -  BMI 27.99 29.14 -    Physical Exam  Constitutional: She is oriented to person, place, and time. She appears well-developed. No distress.    Cardiovascular: Normal rate and regular rhythm.   Pulmonary/Chest: Effort normal and breath sounds normal. She has no wheezes. She has no rales.  Neurological: She is alert and oriented to person, place, and time.  Psychiatric: She has a normal mood and affect.  Vitals reviewed.   Lab Results  Component Value Date   WBC 12.7 (H) 11/30/2015   HGB 15.2 11/30/2015   HCT 42.9 11/30/2015   PLT 244 11/30/2015   GLUCOSE 75 02/05/2016   CHOL 167 11/21/2015   TRIG 130.0 11/21/2015   HDL 31.30 (L) 11/21/2015   LDLCALC 109 (H) 11/21/2015   ALT 21 11/21/2015   AST 23 11/21/2015   NA 140 02/05/2016   K 3.7 02/05/2016   CL 105 02/05/2016   CREATININE 0.51 02/05/2016   BUN 14 02/05/2016   CO2 28 02/05/2016   HGBA1C 5.5 11/21/2015    Assessment & Plan:   Problem List Items Addressed This Visit    Panic attack    Refilled Buspar.      Relevant Medications   busPIRone (BUSPAR) 7.5 MG tablet   Hyperlipidemia    Rechecking lipid panel today. Patient at high risk given hypertension and tobacco abuse.  Relevant Orders   Lipid panel   Essential hypertension - Primary    At goal per home readings. Continue trandolapril. Labs today.      Relevant Orders   Comprehensive metabolic panel   Chronic back pain    Tramadol refilled.       Relevant Medications   traMADol (ULTRAM) 50 MG tablet    Other Visit Diagnoses    Screening for deficiency anemia       Relevant Orders   CBC   Blood glucose elevated       Relevant Orders   Hemoglobin A1c   Colon cancer screening       Relevant Orders   Cologuard   Screening for breast cancer       Relevant Orders   MM Digital Screening   Need for Tdap vaccination          Meds ordered this encounter  Medications  . traMADol (ULTRAM) 50 MG tablet    Sig: Take 1 tablet (50 mg total) by mouth every 6 (six) hours as needed.    Dispense:  90 tablet    Refill:  1  . DISCONTD: busPIRone (BUSPAR) 7.5 MG tablet    Sig: Take 1 tablet  (7.5 mg total) by mouth at bedtime.    Dispense:  90 tablet    Refill:  3  . busPIRone (BUSPAR) 7.5 MG tablet    Sig: Take 1 tablet (7.5 mg total) by mouth at bedtime.    Dispense:  90 tablet    Refill:  3   Follow-up: Return in about 6 months (around 02/23/2017).  [place]

## 2016-08-24 NOTE — Patient Instructions (Signed)
Continue your meds.  Follow up in 6 months.  Take care  Dr. Rainah Kirshner  

## 2016-08-24 NOTE — Progress Notes (Signed)
Pre-visit discussion using our clinic review tool. No additional management support is needed unless otherwise documented below in the visit note.  

## 2016-08-24 NOTE — Assessment & Plan Note (Signed)
Refilled Buspar.

## 2016-08-24 NOTE — Assessment & Plan Note (Signed)
Tramadol refilled.

## 2016-08-24 NOTE — Assessment & Plan Note (Signed)
At goal per home readings. Continue trandolapril. Labs today.

## 2016-08-25 ENCOUNTER — Other Ambulatory Visit: Payer: Self-pay | Admitting: Family Medicine

## 2016-08-25 ENCOUNTER — Telehealth: Payer: Self-pay

## 2016-08-25 DIAGNOSIS — E876 Hypokalemia: Secondary | ICD-10-CM

## 2016-08-25 MED ORDER — POTASSIUM CHLORIDE CRYS ER 20 MEQ PO TBCR: 40.0000 meq | EXTENDED_RELEASE_TABLET | Freq: Every day | ORAL | 0 refills | Status: DC

## 2016-08-25 MED FILL — POTASSIUM CL ER 20 MEQ TAB: 20 | 2 days supply | Qty: 4 | Fill #0

## 2016-08-25 NOTE — Telephone Encounter (Signed)
-----   Message from Coral Spikes, DO sent at 08/25/2016 10:28 AM EDT ----- LDL elevated. Consider statin. Potassium low. Will replete. Recheck in 1 week. Prediabetic.

## 2016-08-26 MED FILL — TRANDOLAPRIL 4 MG TABLET: 4 | 90 days supply | Qty: 90 | Fill #2

## 2016-08-31 ENCOUNTER — Other Ambulatory Visit (INDEPENDENT_AMBULATORY_CARE_PROVIDER_SITE_OTHER): Payer: 59

## 2016-08-31 DIAGNOSIS — E876 Hypokalemia: Secondary | ICD-10-CM | POA: Diagnosis not present

## 2016-08-31 LAB — COMPREHENSIVE METABOLIC PANEL
ALBUMIN: 4.2 g/dL (ref 3.5–5.2)
ALT: 11 U/L (ref 0–35)
AST: 15 U/L (ref 0–37)
Alkaline Phosphatase: 65 U/L (ref 39–117)
BUN: 18 mg/dL (ref 6–23)
CHLORIDE: 103 meq/L (ref 96–112)
CO2: 28 meq/L (ref 19–32)
CREATININE: 0.61 mg/dL (ref 0.40–1.20)
Calcium: 9.4 mg/dL (ref 8.4–10.5)
GFR: 109.74 mL/min (ref >60.00)
Glucose, Bld: 108 mg/dL — ABNORMAL HIGH (ref 70–99)
POTASSIUM: 3.3 meq/L — AB (ref 3.5–5.1)
SODIUM: 137 meq/L (ref 135–145)
Total Bilirubin: 0.5 mg/dL (ref 0.2–1.2)
Total Protein: 6.8 g/dL (ref 6.0–8.3)

## 2016-09-01 ENCOUNTER — Other Ambulatory Visit: Payer: Self-pay | Admitting: Family Medicine

## 2016-09-02 ENCOUNTER — Other Ambulatory Visit: Payer: Self-pay | Admitting: Family Medicine

## 2016-09-02 ENCOUNTER — Telehealth: Payer: Self-pay | Admitting: Family Medicine

## 2016-09-02 DIAGNOSIS — E876 Hypokalemia: Secondary | ICD-10-CM

## 2016-09-02 MED ORDER — POTASSIUM CHLORIDE CRYS ER 20 MEQ PO TBCR: 20.0000 meq | EXTENDED_RELEASE_TABLET | Freq: Every day | ORAL | 1 refills | Status: DC

## 2016-09-02 NOTE — Telephone Encounter (Signed)
Pt called back to follow up on what Dr Lacinda Axon is going to do regarding her potassium result. Please advise?  Call pt @ [phone]. Thank you!

## 2016-09-02 NOTE — Telephone Encounter (Signed)
Spoke with patient advised Dr Lacinda Axon has consulted pharmacist.  I advised patient that we will be getting back to her.

## 2016-09-03 MED FILL — POTASSIUM CL ER 20 MEQ TAB: 20 | 30 days supply | Qty: 30 | Fill #0

## 2016-09-16 ENCOUNTER — Other Ambulatory Visit (INDEPENDENT_AMBULATORY_CARE_PROVIDER_SITE_OTHER): Payer: 59

## 2016-09-16 DIAGNOSIS — E876 Hypokalemia: Secondary | ICD-10-CM

## 2016-09-16 LAB — BASIC METABOLIC PANEL
BUN: 14 mg/dL (ref 6–23)
CALCIUM: 9.4 mg/dL (ref 8.4–10.5)
CO2: 26 meq/L (ref 19–32)
Chloride: 105 mEq/L (ref 96–112)
Creatinine, Ser: 0.54 mg/dL (ref 0.40–1.20)
GFR: 126.29 mL/min (ref >60.00)
GLUCOSE: 114 mg/dL — AB (ref 70–99)
POTASSIUM: 3.6 meq/L (ref 3.5–5.1)
SODIUM: 137 meq/L (ref 135–145)

## 2016-09-16 LAB — MAGNESIUM: Magnesium: 1.4 mg/dL — ABNORMAL LOW (ref 1.5–2.5)

## 2016-09-17 ENCOUNTER — Other Ambulatory Visit: Payer: Self-pay | Admitting: Family Medicine

## 2016-09-17 ENCOUNTER — Encounter: Payer: Self-pay | Admitting: *Deleted

## 2016-09-17 MED ORDER — MAGNESIUM CHLORIDE 64 MG PO TBEC: 2.0000 | DELAYED_RELEASE_TABLET | Freq: Every day | ORAL | 0 refills | Status: AC

## 2016-10-02 MED FILL — POTASSIUM CL ER 20 MEQ TAB: 20 | 30 days supply | Qty: 30 | Fill #1

## 2016-10-20 MED FILL — OMEPRAZOLE 20 MG CAP: 20 | 90 days supply | Qty: 90 | Fill #2

## 2016-11-04 ENCOUNTER — Encounter: Payer: Self-pay | Admitting: Family Medicine

## 2016-11-12 ENCOUNTER — Other Ambulatory Visit: Payer: Self-pay | Admitting: Family Medicine

## 2016-11-12 ENCOUNTER — Telehealth: Payer: Self-pay | Admitting: Radiology

## 2016-11-12 DIAGNOSIS — E876 Hypokalemia: Secondary | ICD-10-CM

## 2016-11-12 NOTE — Telephone Encounter (Signed)
Pt coming in for labs tomorrow, please place future orders. Thank you.  

## 2016-11-13 ENCOUNTER — Other Ambulatory Visit (INDEPENDENT_AMBULATORY_CARE_PROVIDER_SITE_OTHER): Payer: 59

## 2016-11-13 DIAGNOSIS — E876 Hypokalemia: Secondary | ICD-10-CM

## 2016-11-13 LAB — BASIC METABOLIC PANEL
BUN: 12 mg/dL (ref 6–23)
CALCIUM: 9.3 mg/dL (ref 8.4–10.5)
CHLORIDE: 103 meq/L (ref 96–112)
CO2: 30 meq/L (ref 19–32)
CREATININE: 0.6 mg/dL (ref 0.40–1.20)
GFR: 111.76 mL/min (ref >60.00)
Glucose, Bld: 111 mg/dL — ABNORMAL HIGH (ref 70–99)
Potassium: 3 mEq/L — ABNORMAL LOW (ref 3.5–5.1)
SODIUM: 139 meq/L (ref 135–145)

## 2016-11-13 LAB — MAGNESIUM: MAGNESIUM: 1.8 mg/dL (ref 1.5–2.5)

## 2016-11-17 ENCOUNTER — Other Ambulatory Visit: Payer: Self-pay | Admitting: Family Medicine

## 2016-11-17 MED ORDER — POTASSIUM CHLORIDE CRYS ER 20 MEQ PO TBCR: 20.0000 meq | EXTENDED_RELEASE_TABLET | Freq: Every day | ORAL | 1 refills | Status: DC

## 2016-11-17 MED FILL — TRANDOLAPRIL 4 MG TABLET: 4 | 90 days supply | Qty: 90 | Fill #3

## 2016-11-17 MED FILL — POTASSIUM CL ER 20 MEQ TAB: 20 | 30 days supply | Qty: 30 | Fill #0

## 2016-11-23 ENCOUNTER — Other Ambulatory Visit: Payer: Self-pay

## 2016-11-23 ENCOUNTER — Other Ambulatory Visit: Payer: 59

## 2016-11-23 ENCOUNTER — Telehealth: Payer: Self-pay | Admitting: Radiology

## 2016-11-23 ENCOUNTER — Other Ambulatory Visit: Payer: Self-pay | Admitting: Family Medicine

## 2016-11-23 DIAGNOSIS — E876 Hypokalemia: Secondary | ICD-10-CM | POA: Diagnosis not present

## 2016-11-23 MED ORDER — TRAMADOL HCL 50 MG PO TABS: 50.0000 mg | ORAL_TABLET | Freq: Four times a day (QID) | ORAL | 1 refills | Status: DC | PRN

## 2016-11-23 MED FILL — traMADol HCL 50 MG TABS: 50 | 22 days supply | Qty: 90 | Fill #0

## 2016-11-23 NOTE — Telephone Encounter (Signed)
Last refill 10/09/16 Last office visit 08/24/16 Next office visit 02/23/17 Dr [name]

## 2016-11-23 NOTE — Telephone Encounter (Signed)
Pt dropped off 24 hour urine, please place future order. Thank you.

## 2016-11-23 NOTE — Telephone Encounter (Signed)
Script faxed.

## 2016-11-24 LAB — POTASSIUM, URINE, 24 HOUR: POTASSIUM 24H UR: 30 mmol/(24.h) (ref 22–160)

## 2016-11-26 ENCOUNTER — Ambulatory Visit (INDEPENDENT_AMBULATORY_CARE_PROVIDER_SITE_OTHER): Payer: 59

## 2016-11-26 ENCOUNTER — Encounter: Payer: Self-pay | Admitting: Family Medicine

## 2016-11-26 ENCOUNTER — Ambulatory Visit (INDEPENDENT_AMBULATORY_CARE_PROVIDER_SITE_OTHER): Payer: 59 | Admitting: Family Medicine

## 2016-11-26 ENCOUNTER — Telehealth: Payer: Self-pay | Admitting: Radiology

## 2016-11-26 VITALS — BP 110/76 | HR 87 | Temp 98.3°F | Resp 16 | Wt 144.4 lb

## 2016-11-26 DIAGNOSIS — Z1231 Encounter for screening mammogram for malignant neoplasm of breast: Secondary | ICD-10-CM

## 2016-11-26 DIAGNOSIS — Z1211 Encounter for screening for malignant neoplasm of colon: Secondary | ICD-10-CM | POA: Diagnosis not present

## 2016-11-26 DIAGNOSIS — R634 Abnormal weight loss: Secondary | ICD-10-CM

## 2016-11-26 DIAGNOSIS — Z1239 Encounter for other screening for malignant neoplasm of breast: Secondary | ICD-10-CM

## 2016-11-26 DIAGNOSIS — R079 Chest pain, unspecified: Secondary | ICD-10-CM | POA: Diagnosis not present

## 2016-11-26 LAB — CBC WITH DIFFERENTIAL/PLATELET
BASOS PCT: 1 % (ref 0.0–3.0)
Basophils Absolute: 0.1 10*3/uL (ref 0.0–0.1)
EOS ABS: 0.1 10*3/uL (ref 0.0–0.7)
EOS PCT: 1.5 % (ref 0.0–5.0)
HEMATOCRIT: 43.7 % (ref 36.0–46.0)
HEMOGLOBIN: 14.7 g/dL (ref 12.0–15.0)
LYMPHS PCT: 50.3 % — AB (ref 12.0–46.0)
Lymphs Abs: 3.8 10*3/uL (ref 0.7–4.0)
MCHC: 33.7 g/dL (ref 30.0–36.0)
MCV: 94.7 fl (ref 78.0–100.0)
MONOS PCT: 5.2 % (ref 3.0–12.0)
Monocytes Absolute: 0.4 10*3/uL (ref 0.1–1.0)
Neutro Abs: 3.2 10*3/uL (ref 1.4–7.7)
Neutrophils Relative %: 42 % — ABNORMAL LOW (ref 43.0–77.0)
Platelets: 281 10*3/uL (ref 150.0–400.0)
RBC: 4.61 Mil/uL (ref 3.87–5.11)
RDW: 12.6 % (ref 11.5–15.5)
WBC: 7.5 10*3/uL (ref 4.0–10.5)

## 2016-11-26 LAB — COMPREHENSIVE METABOLIC PANEL
ALK PHOS: 70 U/L (ref 39–117)
ALT: 9 U/L (ref 0–35)
AST: 1 U/L (ref 0–37)
Albumin: 4.1 g/dL (ref 3.5–5.2)
BUN: 13 mg/dL (ref 6–23)
CHLORIDE: 105 meq/L (ref 96–112)
CO2: 30 meq/L (ref 19–32)
Calcium: 9.6 mg/dL (ref 8.4–10.5)
Creatinine, Ser: 0.59 mg/dL (ref 0.40–1.20)
GFR: 113.94 mL/min (ref >60.00)
Glucose, Bld: 81 mg/dL (ref 70–99)
POTASSIUM: 4.1 meq/L (ref 3.5–5.1)
SODIUM: 137 meq/L (ref 135–145)
Total Bilirubin: 0.4 mg/dL (ref 0.2–1.2)
Total Protein: 6.9 g/dL (ref 6.0–8.3)

## 2016-11-26 LAB — HEMOGLOBIN A1C: Hgb A1c MFr Bld: 5.7 % (ref 4.6–6.5)

## 2016-11-26 LAB — TSH: TSH: 0.62 u[IU]/mL (ref 0.35–4.50)

## 2016-11-26 LAB — SEDIMENTATION RATE: Sed Rate: 4 mm/hr (ref 0–30)

## 2016-11-26 NOTE — Patient Instructions (Signed)
We will call with the results and with the referral.  Take care  Dr. Lacinda Axon

## 2016-11-26 NOTE — Telephone Encounter (Signed)
Pt coming in for labs next Tuesday, please place future orders. Thank you.  

## 2016-11-29 DIAGNOSIS — R634 Abnormal weight loss: Secondary | ICD-10-CM | POA: Insufficient documentation

## 2016-11-29 NOTE — Progress Notes (Signed)
Subjective:  Patient ID: SHARMANE DAME, female    DOB: 12-26-1965  Age: 51 y.o. MRN: [id number]  CC: Weight loss  HPI:  51 year old female with HTN, GERD, HLD, Tobacco abuse presents for evaluation of the above.  Weight loss  Unintentional. She is unsure why. Has lost 20 pounds since October 2017.   She feels that it started at th end of last year/beginning of this year.  Reports early satiety.  No hematochezia or melena.  Does note bloating and constipation (constipation is new; she normally has loose stool).  No reports of chest pain or SOB.  Continues to smoke.  Cancer screening not up to date (needs mammogram and colonoscopy). Had pap smear earlier this year that was normal.   No other reported symptoms.  No other complaints or concerns at this time.  Social Hx   Social History   Social History  . Marital status: Single    Spouse name: N/A  . Number of children: N/A  . Years of education: N/A   Social History Main Topics  . Smoking status: Current Every Day Smoker    Packs/day: 0.50    Years: 30.00    Types: Cigarettes    Start date: 10/21/1984  . Smokeless tobacco: Never Used  . Alcohol use 0.0 oz/week     Comment: Rare  . Drug use: No  . Sexual activity: Not Currently    Partners: Male    Birth control/ protection: IUD   Other Topics Concern  . None   Social History Narrative   Works at [employer] with son and daughter   Pets: None   Caffeine- 1 20 oz bottle of soda     Review of Systems  Constitutional: Positive for unexpected weight change. Negative for appetite change.  Respiratory: Negative.   Cardiovascular: Negative.   Gastrointestinal: Positive for constipation. Negative for blood in stool.   Objective:  BP 110/76 (BP Location: Left Arm, Patient Position: Sitting, Cuff Size: Normal)   Pulse 87   Temp 98.3 F (36.8 C) (Oral)   Resp 16   Wt 144 lb 6 oz (65.5 kg)   SpO2 98%   BMI 25.57 kg/m   BP/Weight 11/26/2016  08/24/2016 88/05/2547  Systolic BP 826 415 830  Diastolic BP 76 88 96  Wt. (Lbs) 144.38 158 164.5  BMI 25.57 27.99 29.14    Physical Exam  Constitutional: She is oriented to person, place, and time. She appears well-developed. No distress.  Cardiovascular: Normal rate and regular rhythm.   Pulmonary/Chest: Effort normal. She has no wheezes. She has no rales.  Abdominal: Soft. She exhibits no distension. There is no tenderness.  Neurological: She is alert and oriented to person, place, and time.  Psychiatric: She has a normal mood and affect.  Vitals reviewed.   Lab Results  Component Value Date   WBC 7.5 11/26/2016   HGB 14.7 11/26/2016   HCT 43.7 11/26/2016   PLT 281.0 11/26/2016   GLUCOSE 81 11/26/2016   CHOL 177 08/24/2016   TRIG 120.0 08/24/2016   HDL 32.20 (L) 08/24/2016   LDLCALC 121 (H) 08/24/2016   ALT 9 11/26/2016   AST 1 11/26/2016   NA 137 11/26/2016   K 4.1 11/26/2016   CL 105 11/26/2016   CREATININE 0.59 11/26/2016   BUN 13 11/26/2016   CO2 30 11/26/2016   TSH 0.62 11/26/2016   HGBA1C 5.7 11/26/2016    Assessment & Plan:   Problem  List Items Addressed This Visit    Unintentional weight loss - Primary    New problem. Uncertain etiology/prognosis. Proceeding with work up [employer] today. Mammogram ordered. Will place referral for colonoscopy. Chest xray.       Relevant Orders   Hemoglobin A1c (Completed)   Comprehensive metabolic panel (Completed)   TSH (Completed)   Sed Rate (ESR) (Completed)   DG Chest 2 View (Completed)   CBC w/Diff (Completed)    Other Visit Diagnoses    Breast cancer screening       Relevant Orders   MM Digital Screening   Encounter for screening colonoscopy       Relevant Orders   Ambulatory referral to Gastroenterology      Follow-up: Pending work up.  [place]

## 2016-11-29 NOTE — Assessment & Plan Note (Signed)
New problem. Uncertain etiology/prognosis. Proceeding with work up [employer] today. Mammogram ordered. Will place referral for colonoscopy. Chest xray.

## 2016-12-01 ENCOUNTER — Other Ambulatory Visit: Payer: 59

## 2016-12-01 NOTE — Telephone Encounter (Signed)
I cancelled appt, since pt had normal labs drawn on 11/26/16. I'm assuming this appt was supposed to be cancelled at that time.

## 2016-12-01 NOTE — Telephone Encounter (Signed)
Second request for lab orders. Pt coming in this morning. No labs will be drawn if no orders are placed. Please place "future" orders.

## 2016-12-17 MED FILL — POTASSIUM CL ER 20 MEQ TAB: 20 | 30 days supply | Qty: 30 | Fill #1

## 2017-01-21 ENCOUNTER — Other Ambulatory Visit: Payer: Self-pay | Admitting: Family Medicine

## 2017-01-21 MED FILL — traMADol HCL 50 MG TABS: 50 | 22 days supply | Qty: 90 | Fill #1

## 2017-01-22 ENCOUNTER — Other Ambulatory Visit: Payer: Self-pay

## 2017-01-22 ENCOUNTER — Telehealth: Payer: Self-pay

## 2017-01-22 DIAGNOSIS — Z1211 Encounter for screening for malignant neoplasm of colon: Secondary | ICD-10-CM

## 2017-01-22 MED ORDER — PEG 3350-KCL-NA BICARB-NACL 420 G PO SOLR: 4000.0000 mL | Freq: Once | ORAL | 0 refills | Status: AC

## 2017-01-22 MED FILL — GAVILYTE-G SOLUTION: 236 | 2 days supply | Qty: 4000 | Fill #0

## 2017-01-22 NOTE — Telephone Encounter (Signed)
Gastroenterology Pre-Procedure Review  Request Date:   Requesting Physician: Dr.    PATIENT REVIEW QUESTIONS: The patient responded to the following health history questions as indicated:    1. Are you having any GI issues? no 2. Do you have a personal history of Polyps? no 3. Do you have a family history of Colon Cancer or Polyps? Yes, brother. Don't know what kind 4. Diabetes Mellitus? no 5. Joint replacements in the past 12 months? no 6. Major health problems in the past 3 months? no 7. Any artificial heart valves, MVP, or defibrillator? no    MEDICATIONS & ALLERGIES:    Patient reports the following regarding taking any anticoagulation/antiplatelet therapy:   Plavix, Coumadin, Eliquis, Xarelto, Lovenox, Pradaxa, Brilinta, or Effient? no Aspirin? no  Patient confirms/reports the following medications:  Current Outpatient Medications  Medication Sig Dispense Refill  . busPIRone (BUSPAR) 7.5 MG tablet Take 1 tablet (7.5 mg total) by mouth at bedtime. 90 tablet 3  . omeprazole (PRILOSEC) 20 MG capsule TAKE 1 CAPSULE BY MOUTH ONCE DAILY 90 capsule 3  . potassium chloride SA (K-DUR,KLOR-CON) 20 MEQ tablet Take 1 tablet (20 mEq total) by mouth daily. 30 tablet 1  . traMADol (ULTRAM) 50 MG tablet Take 1 tablet (50 mg total) by mouth every 6 (six) hours as needed. 90 tablet 1  . trandolapril (MAVIK) 4 MG tablet TAKE 1 TABLET (4 MG TOTAL) BY MOUTH DAILY. 90 tablet 3   No current facility-administered medications for this visit.     Patient confirms/reports the following allergies:  Allergies  Allergen Reactions  . Thiazide-Type Diuretics     Hypokalemia, Hyponatremia, Chest pain  . Mushroom Extract Complex   . Sulfa Antibiotics     No orders of the defined types were placed in this encounter.   AUTHORIZATION INFORMATION Primary Insurance: 1D#: Group #:  Secondary Insurance: 1D#: Group #:  SCHEDULE INFORMATION: Date: 02/12/17 Time: Location: [facility]

## 2017-01-25 MED FILL — POTASSIUM CL ER 20 MEQ TAB: 20 | 30 days supply | Qty: 30 | Fill #0

## 2017-01-25 MED FILL — OMEPRAZOLE 20 MG CAP: 20 | 30 days supply | Qty: 30 | Fill #0

## 2017-02-11 ENCOUNTER — Encounter: Payer: Self-pay | Admitting: *Deleted

## 2017-02-12 ENCOUNTER — Encounter
Admission: RE | Disposition: A | Discharge status: Home / Self Care | Payer: Self-pay | Source: Ambulatory Visit | Attending: Gastroenterology

## 2017-02-12 ENCOUNTER — Ambulatory Visit
Admission: RE | Admit: 2017-02-12 | Discharge: 2017-02-12 | Disposition: A | Discharge status: Home / Self Care | Payer: 59 | Source: Ambulatory Visit | Attending: Gastroenterology | Admitting: Gastroenterology

## 2017-02-12 ENCOUNTER — Ambulatory Visit: Payer: 59 | Admitting: Anesthesiology

## 2017-02-12 ENCOUNTER — Encounter: Payer: Self-pay | Admitting: *Deleted

## 2017-02-12 DIAGNOSIS — Z79899 Other long term (current) drug therapy: Secondary | ICD-10-CM | POA: Diagnosis not present

## 2017-02-12 DIAGNOSIS — F17210 Nicotine dependence, cigarettes, uncomplicated: Secondary | ICD-10-CM | POA: Diagnosis not present

## 2017-02-12 DIAGNOSIS — K219 Gastro-esophageal reflux disease without esophagitis: Secondary | ICD-10-CM | POA: Diagnosis not present

## 2017-02-12 DIAGNOSIS — K648 Other hemorrhoids: Secondary | ICD-10-CM | POA: Insufficient documentation

## 2017-02-12 DIAGNOSIS — K640 First degree hemorrhoids: Secondary | ICD-10-CM | POA: Diagnosis not present

## 2017-02-12 DIAGNOSIS — Z87442 Personal history of urinary calculi: Secondary | ICD-10-CM | POA: Diagnosis not present

## 2017-02-12 DIAGNOSIS — Z1211 Encounter for screening for malignant neoplasm of colon: Secondary | ICD-10-CM

## 2017-02-12 DIAGNOSIS — I10 Essential (primary) hypertension: Secondary | ICD-10-CM | POA: Diagnosis not present

## 2017-02-12 DIAGNOSIS — D125 Benign neoplasm of sigmoid colon: Secondary | ICD-10-CM | POA: Insufficient documentation

## 2017-02-12 DIAGNOSIS — F419 Anxiety disorder, unspecified: Secondary | ICD-10-CM | POA: Insufficient documentation

## 2017-02-12 DIAGNOSIS — D128 Benign neoplasm of rectum: Secondary | ICD-10-CM | POA: Diagnosis not present

## 2017-02-12 DIAGNOSIS — Z87440 Personal history of urinary (tract) infections: Secondary | ICD-10-CM | POA: Diagnosis not present

## 2017-02-12 DIAGNOSIS — K635 Polyp of colon: Secondary | ICD-10-CM | POA: Diagnosis not present

## 2017-02-12 HISTORY — PX: COLONOSCOPY WITH PROPOFOL: SHX5780

## 2017-02-12 HISTORY — DX: Anxiety disorder, unspecified: F41.9

## 2017-02-12 LAB — POCT PREGNANCY, URINE: PREG TEST UR: NEGATIVE

## 2017-02-12 SURGERY — COLONOSCOPY WITH PROPOFOL
Anesthesia: General

## 2017-02-12 MED ORDER — PROPOFOL 500 MG/50ML IV EMUL
INTRAVENOUS | Status: AC
  Filled 2017-02-12: qty 50

## 2017-02-12 MED ORDER — MIDAZOLAM HCL 2 MG/2ML IJ SOLN
INTRAMUSCULAR | Status: AC
  Filled 2017-02-12: qty 2

## 2017-02-12 MED ORDER — MIDAZOLAM HCL 5 MG/5ML IJ SOLN
INTRAMUSCULAR | Status: DC | PRN
  Administered 2017-02-12: 1 mg via INTRAVENOUS

## 2017-02-12 MED ORDER — PROPOFOL 500 MG/50ML IV EMUL
INTRAVENOUS | Status: DC | PRN
  Administered 2017-02-12: 140 ug via INTRAVENOUS

## 2017-02-12 MED ORDER — SODIUM CHLORIDE 0.9 % IV SOLN
INTRAVENOUS | Status: DC
  Administered 2017-02-12: 1000 mL via INTRAVENOUS

## 2017-02-12 MED ORDER — LACTATED RINGERS IV SOLN
INTRAVENOUS | Status: DC | PRN
  Administered 2017-02-12: 10:00:00 via INTRAVENOUS

## 2017-02-12 NOTE — Transfer of Care (Signed)
Immediate Anesthesia Transfer of Care Note  Patient: Christina Galvan  Procedure(s) Performed: COLONOSCOPY WITH PROPOFOL (N/A )  Patient Location: PACU  Anesthesia Type:General  Level of Consciousness: sedated  Airway & Oxygen Therapy: Patient Spontanous Breathing  Post-op Assessment: Report given to RN  Post vital signs: stable  Last Vitals:  Vitals:   02/12/17 0910 02/12/17 1021  BP: (!) 152/76 (!) 99/49  Pulse: 98 92  Resp: (!) 22 17  Temp: (!) 35.8 C (!) 36.1 C  SpO2: 97% 100%    Last Pain:  Vitals:   02/12/17 1021  TempSrc: Tympanic         Complications: No apparent anesthesia complications

## 2017-02-12 NOTE — Addendum Note (Signed)
Addendum  created 02/12/17 1616 by Gunnar Fusi, MD   Charge Capture section accepted

## 2017-02-12 NOTE — Op Note (Signed)
[facility] Patient Name: Christina Galvan Procedure Date: 02/12/2017 9:50 AM MRN: [id number] Account #: [id number] Date of Birth: 1965-10-09 Admit Type: Outpatient Age: 51 Room: [facility] ENDO ROOM 4 Gender: Female Note Status: Finalized Procedure:            Colonoscopy Indications:          Screening for colorectal malignant neoplasm Providers:            Jonathon Bellows MD, MD Referring MD:         Angela Adam. [name] (Referring MD) Medicines:            Monitored Anesthesia Care Complications:        No immediate complications. Procedure:            Pre-Anesthesia Assessment:                       - Prior to the procedure, a History and Physical was                        performed, and patient medications, allergies and                        sensitivities were reviewed. The patient's tolerance of                        previous anesthesia was reviewed.                       - The risks and benefits of the procedure and the                        sedation options and risks were discussed with the                        patient. All questions were answered and informed                        consent was obtained.                       - ASA Grade Assessment: II - A patient with mild                        systemic disease.                       After obtaining informed consent, the colonoscope was                        passed under direct vision. Throughout the procedure,                        the patient's blood pressure, pulse, and oxygen                        saturations were monitored continuously. The                        Colonoscope was introduced through the anus and  advanced to the the cecum, identified by the                        appendiceal orifice, IC valve and transillumination.                        The colonoscopy was performed with ease. The patient                        tolerated the procedure well. The  quality of the bowel                        preparation was good. Findings:      The perianal and digital rectal examinations were normal.      Non-bleeding internal hemorrhoids were found during retroflexion. The       hemorrhoids were medium-sized.      A 3 mm polyp was found in the sigmoid colon. The polyp was sessile. The       polyp was removed with a cold biopsy forceps. Resection and retrieval       were complete.      The exam was otherwise without abnormality on direct and retroflexion       views. Impression:           - Non-bleeding internal hemorrhoids.                       - One 3 mm polyp in the sigmoid colon, removed with a                        cold biopsy forceps. Resected and retrieved.                       - The examination was otherwise normal on direct and                        retroflexion views. Recommendation:       - Discharge patient to home (with escort).                       - Resume previous diet.                       - Continue present medications.                       - Await pathology results.                       - Repeat colonoscopy in 5-10 years for surveillance                        based on pathology results. Procedure Code(s):    --- Professional ---                       [id number], Colonoscopy, flexible; with biopsy, single or                        multiple Diagnosis Code(s):    --- Professional ---  Z12.11, Encounter for screening for malignant neoplasm                        of colon                       D12.5, Benign neoplasm of sigmoid colon                       K64.8, Other hemorrhoids CPT copyright 2016 American Medical Association. All rights reserved. The codes documented in this report are preliminary and upon coder review may  be revised to meet current compliance requirements. Jonathon Bellows, MD Jonathon Bellows MD, MD 02/12/2017 10:18:00 AM This report has been signed electronically. Number of Addenda: 0 Note  Initiated On: 02/12/2017 9:50 AM Scope Withdrawal Time: 0 hours 12 minutes 54 seconds  Total Procedure Duration: 0 hours 18 minutes 57 seconds       [facility]

## 2017-02-12 NOTE — Anesthesia Preprocedure Evaluation (Signed)
Anesthesia Evaluation  Patient identified by MRN, date of birth, ID band Patient awake    Reviewed: Allergy & Precautions, NPO status , Patient's Chart, lab work & pertinent test results  History of Anesthesia Complications Negative for: history of anesthetic complications  Airway Mallampati: II       Dental  (+) Loose   Pulmonary neg sleep apnea, neg COPD, Current Smoker,           Cardiovascular hypertension, Pt. on medications (-) Past MI and (-) CHF (-) dysrhythmias (-) Valvular Problems/Murmurs     Neuro/Psych neg Seizures Anxiety    GI/Hepatic Neg liver ROS, GERD  Medicated and Controlled,  Endo/Other  neg diabetes  Renal/GU Renal disease (stones)     Musculoskeletal   Abdominal   Peds  Hematology   Anesthesia Other Findings   Reproductive/Obstetrics                            Anesthesia Physical Anesthesia Plan  ASA: II  Anesthesia Plan: General   Post-op Pain Management:    Induction: Intravenous  PONV Risk Score and Plan: 2 and TIVA and Propofol infusion  Airway Management Planned: Nasal Cannula and Natural Airway  Additional Equipment:   Intra-op Plan:   Post-operative Plan:   Informed Consent: I have reviewed the patients History and Physical, chart, labs and discussed the procedure including the risks, benefits and alternatives for the proposed anesthesia with the patient or authorized representative who has indicated his/her understanding and acceptance.     Plan Discussed with:   Anesthesia Plan Comments:         Anesthesia Quick Evaluation

## 2017-02-12 NOTE — Anesthesia Postprocedure Evaluation (Signed)
Anesthesia Post Note  Patient: Christina Galvan  Procedure(s) Performed: COLONOSCOPY WITH PROPOFOL (N/A )  Patient location during evaluation: Endoscopy Anesthesia Type: General Level of consciousness: awake and alert Pain management: pain level controlled Vital Signs Assessment: post-procedure vital signs reviewed and stable Respiratory status: spontaneous breathing and respiratory function stable Cardiovascular status: stable Anesthetic complications: no     Last Vitals:  Vitals:   02/12/17 0910 02/12/17 1021  BP: (!) 152/76 (!) 99/49  Pulse: 98 92  Resp: (!) 22 17  Temp: (!) 35.8 C (!) 36.1 C  SpO2: 97% 100%    Last Pain:  Vitals:   02/12/17 1021  TempSrc: Tympanic                 KEPHART,WILLIAM K

## 2017-02-12 NOTE — Anesthesia Post-op Follow-up Note (Signed)
Anesthesia QCDR form completed.        

## 2017-02-12 NOTE — H&P (Signed)
Jonathon Bellows, MD [address] [place], [place], [place], [address] [address], [place], [place], [place], [address] Phone: [phone]  Fax: [phone]  Primary Care Physician:  Leone Haven, MD   Pre-Procedure History & Physical: HPI:  Christina Galvan is a 51 y.o. female is here for an colonoscopy.   Past Medical History:  Diagnosis Date  . Anxiety   . GERD (gastroesophageal reflux disease)   . History of frequent urinary tract infections   . Hypertension   . Kidney stones   . Migraines     Past Surgical History:  Procedure Laterality Date  . carpal tunnel    . CESAREAN SECTION  1993  . CHOLECYSTECTOMY  1994    Prior to Admission medications   Medication Sig Start Date End Date Taking? Authorizing Provider  busPIRone (BUSPAR) 7.5 MG tablet Take 1 tablet (7.5 mg total) by mouth at bedtime. 08/24/16   Coral Spikes, DO  omeprazole (PRILOSEC) 20 MG capsule TAKE 1 CAPSULE BY MOUTH ONCE DAILY 01/25/17   Leone Haven, MD  potassium chloride SA (K-DUR,KLOR-CON) 20 MEQ tablet TAKE 1 TABLET BY MOUTH ONCE DAILY 01/25/17   Leone Haven, MD  traMADol (ULTRAM) 50 MG tablet Take 1 tablet (50 mg total) by mouth every 6 (six) hours as needed. 11/23/16   Thersa Salt G, DO  trandolapril (MAVIK) 4 MG tablet TAKE 1 TABLET (4 MG TOTAL) BY MOUTH DAILY. 02/25/16   Coral Spikes, DO    Allergies as of 01/22/2017 - Review Complete 11/26/2016  Allergen Reaction Noted  . Thiazide-type diuretics  12/09/2015  . Mushroom extract complex  06/24/2015  . Sulfa antibiotics  06/24/2015    Family History  Problem Relation Age of Onset  . Arthritis Mother   . Heart disease Mother   . Hypertension Mother   . Diabetes Mother   . Cancer Father        Lung Cancer  . Diabetes Brother   . Asthma Daughter   . Cancer Paternal Uncle        Prostate cancer  . Heart disease Maternal Grandfather   . Heart disease Paternal Grandfather     Social History   Socioeconomic  History  . Marital status: Single    Spouse name: Not on file  . Number of children: Not on file  . Years of education: Not on file  . Highest education level: Not on file  Social Needs  . Financial resource strain: Not on file  . Food insecurity - worry: Not on file  . Food insecurity - inability: Not on file  . Transportation needs - medical: Not on file  . Transportation needs - non-medical: Not on file  Occupational History  . Not on file  Tobacco Use  . Smoking status: Current Every Day Smoker    Packs/day: 0.50    Years: 30.00    Pack years: 15.00    Types: Cigarettes    Start date: 10/21/1984  . Smokeless tobacco: Never Used  Substance and Sexual Activity  . Alcohol use: Yes    Alcohol/week: 0.0 oz    Comment: Rare  . Drug use: No  . Sexual activity: Not Currently    Partners: Male    Birth control/protection: IUD  Other Topics Concern  . Not on file  Social History Narrative   Works at [employer] with son and daughter   Pets: None   Caffeine- 1 20  oz bottle of soda     Review of Systems: See HPI, otherwise negative ROS  Physical Exam: There were no vitals taken for this visit. General:   Alert,  pleasant and cooperative in NAD Head:  Normocephalic and atraumatic. Neck:  Supple; no masses or thyromegaly. Lungs:  Clear throughout to auscultation, normal respiratory effort.    Heart:  +S1, +S2, Regular rate and rhythm, No edema. Abdomen:  Soft, nontender and nondistended. Normal bowel sounds, without guarding, and without rebound.   Neurologic:  Alert and  oriented x4;  grossly normal neurologically.  Impression/Plan: REHMAT MURTAGH is here for an colonoscopy to be performed for Screening colonoscopy average risk   Risks, benefits, limitations, and alternatives regarding  colonoscopy have been reviewed with the patient.  Questions have been answered.  All parties agreeable.   Jonathon Bellows, MD  02/12/2017, 9:08 AM

## 2017-02-15 ENCOUNTER — Encounter: Payer: Self-pay | Admitting: Gastroenterology

## 2017-02-15 LAB — SURGICAL PATHOLOGY

## 2017-02-18 ENCOUNTER — Other Ambulatory Visit: Payer: Self-pay

## 2017-02-18 MED ORDER — TRANDOLAPRIL 4 MG PO TABS: 4.0000 mg | ORAL_TABLET | Freq: Every day | ORAL | 1 refills | Status: DC

## 2017-02-18 MED FILL — TRANDOLAPRIL 4 MG TABLET: 4 | 90 days supply | Qty: 90 | Fill #0

## 2017-02-18 NOTE — Telephone Encounter (Signed)
Last OV with Dr.Cook 11/26/16 last filled 02/25/16 90 3rf

## 2017-02-18 NOTE — Telephone Encounter (Signed)
Sent to pharmacy.  Patient needs to establish care.  Thanks.

## 2017-02-23 ENCOUNTER — Ambulatory Visit: Payer: 59 | Admitting: Family Medicine

## 2017-02-23 ENCOUNTER — Ambulatory Visit (INDEPENDENT_AMBULATORY_CARE_PROVIDER_SITE_OTHER): Payer: 59 | Admitting: Family Medicine

## 2017-02-23 ENCOUNTER — Other Ambulatory Visit: Payer: Self-pay

## 2017-02-23 ENCOUNTER — Encounter: Payer: Self-pay | Admitting: Family Medicine

## 2017-02-23 VITALS — BP 130/70 | HR 92 | Temp 97.9°F | Wt 148.8 lb

## 2017-02-23 DIAGNOSIS — M545 Low back pain: Secondary | ICD-10-CM | POA: Diagnosis not present

## 2017-02-23 DIAGNOSIS — F419 Anxiety disorder, unspecified: Secondary | ICD-10-CM

## 2017-02-23 DIAGNOSIS — R634 Abnormal weight loss: Secondary | ICD-10-CM | POA: Diagnosis not present

## 2017-02-23 DIAGNOSIS — F329 Major depressive disorder, single episode, unspecified: Secondary | ICD-10-CM

## 2017-02-23 DIAGNOSIS — F32A Depression, unspecified: Secondary | ICD-10-CM

## 2017-02-23 DIAGNOSIS — Z1231 Encounter for screening mammogram for malignant neoplasm of breast: Secondary | ICD-10-CM

## 2017-02-23 DIAGNOSIS — G8929 Other chronic pain: Secondary | ICD-10-CM | POA: Diagnosis not present

## 2017-02-23 DIAGNOSIS — Z1239 Encounter for other screening for malignant neoplasm of breast: Secondary | ICD-10-CM

## 2017-02-23 MED ORDER — OMEPRAZOLE 20 MG PO CPDR: 20.0000 mg | DELAYED_RELEASE_CAPSULE | Freq: Every day | ORAL | 3 refills | Status: DC

## 2017-02-23 MED ORDER — TRAMADOL HCL 50 MG PO TABS: 50.0000 mg | ORAL_TABLET | Freq: Four times a day (QID) | ORAL | 1 refills | Status: DC | PRN

## 2017-02-23 MED ORDER — BUSPIRONE HCL 5 MG PO TABS: 5.0000 mg | ORAL_TABLET | Freq: Two times a day (BID) | ORAL | 3 refills | Status: DC

## 2017-02-23 MED FILL — busPIRone HCL 5 MG TABS: 5 | 30 days supply | Qty: 60 | Fill #0

## 2017-02-23 MED FILL — OMEPRAZOLE 20 MG CAP: 20 | 30 days supply | Qty: 30 | Fill #0

## 2017-02-23 NOTE — Assessment & Plan Note (Signed)
Is actually improved.  Colonoscopy has been completed.  She has not gotten the mammogram done.  Suspect her weight loss is related to stress and anxiety though we will complete the mammogram for screening purposes.

## 2017-02-23 NOTE — Progress Notes (Signed)
Christina Rumps, MD Phone: [phone]  Christina Galvan is a 51 y.o. female who presents today for f/u.  Anxiety: Patient has a history of panic attacks.  She has been on BuSpar.  Only takes it if she needs it.  Does note it helps quite a bit.  Describes panic attacks as feeling nervous and then getting overwhelmed.  No chest pain or shortness of breath.  Has had lots of life events that have provided triggers.  She was evaluated for weight loss previously.  Weight is up several pounds recently.  Colonoscopy with an adenoma.  Recall in 5 years.  She has not gotten the mammogram done yet.  Lab work was overall unremarkable.  She notes she just does not get as hungry as she used to and thinks this may be related to her anxiety.  Chronic back pain: She has been on tramadol for some time now.  This helps take the edge off.  She has been evaluated by several neurosurgeons both of which declined doing surgery.  Pain does occasionally radiate down her left leg.  Occasional left foot numbness.  No weakness.  No incontinence.  Social History   Tobacco Use  Smoking Status Current Every Day Smoker  . Packs/day: 0.50  . Years: 30.00  . Pack years: 15.00  . Types: Cigarettes  . Start date: 10/21/1984  Smokeless Tobacco Never Used     ROS see history of present illness  Objective  Physical Exam Vitals:   02/23/17 1421  BP: 130/70  Pulse: 92  Temp: 97.9 F (36.6 C)  SpO2: 98%    BP Readings from Last 3 Encounters:  02/23/17 130/70  02/12/17 (!) 101/58  11/26/16 110/76   Wt Readings from Last 3 Encounters:  02/23/17 148 lb 12.8 oz (67.5 kg)  02/12/17 143 lb (64.9 kg)  11/26/16 144 lb 6 oz (65.5 kg)    Physical Exam  Constitutional: No distress.  Cardiovascular: Normal rate, regular rhythm and normal heart sounds.  Pulmonary/Chest: Effort normal and breath sounds normal.  Musculoskeletal: She exhibits no edema.  No midline spine tenderness, no midline spine step-off, left  lumbar muscular back tenderness with no overlying skin changes  Neurological: She is alert. Gait normal.  5/5 strength bilateral quads, hamstrings, plantar flexion, and dorsiflexion, sensation light touch intact bilateral lower extremities  Skin: Skin is warm and dry. She is not diaphoretic.     Assessment/Plan: Please see individual problem list.  Chronic back pain Chronic issue.  Taking tramadol with good benefit.  Discussed referral for third opinion or to see physiatry to consider injections though she declined.  If she changes her mind she will let us know  Anxiety and depression Chronic intermittent issue.  She has not appropriately been taking BuSpar.  She does report some depression.  No SI.  Discussed taking BuSpar 5 mg twice daily.  A new prescription was sent to her pharmacy.  If it makes her drowsy she will let us know.  I also discussed Wellbutrin given her fear of serotonin syndrome while being on the tramadol.  She is unsure of this and will let us know and she was given patient information on Wellbutrin.  Unintentional weight loss Is actually improved.  Colonoscopy has been completed.  She has not gotten the mammogram done.  Suspect her weight loss is related to stress and anxiety though we will complete the mammogram for screening purposes.   Christina Galvan was seen today for establish care.  Diagnoses and all orders  for this visit:  Breast cancer screening -     MM SCREENING BREAST TOMO BILATERAL; Future  Chronic low back pain, unspecified back pain laterality, with sciatica presence unspecified  Anxiety and depression  Unintentional weight loss  Other orders -     busPIRone (BUSPAR) 5 MG tablet; Take 1 tablet (5 mg total) by mouth 2 (two) times daily. -     traMADol (ULTRAM) 50 MG tablet; Take 1 tablet (50 mg total) by mouth every 6 (six) hours as needed. -     omeprazole (PRILOSEC) 20 MG capsule; Take 1 capsule (20 mg total) by mouth daily.    Orders Placed This  Encounter  Procedures  . MM SCREENING BREAST TOMO BILATERAL    Standing Status:   Future    Standing Expiration Date:   04/26/2018    Order Specific Question:   Reason for Exam (SYMPTOM  OR DIAGNOSIS REQUIRED)    Answer:   breast cancer screening    Order Specific Question:   Is the patient pregnant?    Answer:   No    Order Specific Question:   Preferred imaging location?    Answer:   [facility] ordered this encounter  Medications  . busPIRone (BUSPAR) 5 MG tablet    Sig: Take 1 tablet (5 mg total) by mouth 2 (two) times daily.    Dispense:  60 tablet    Refill:  3  . traMADol (ULTRAM) 50 MG tablet    Sig: Take 1 tablet (50 mg total) by mouth every 6 (six) hours as needed.    Dispense:  90 tablet    Refill:  1  . omeprazole (PRILOSEC) 20 MG capsule    Sig: Take 1 capsule (20 mg total) by mouth daily.    Dispense:  30 capsule    Refill:  [place], MD [place]

## 2017-02-23 NOTE — Patient Instructions (Addendum)
Nice to see you. If you change your mind regarding seeing an additional specialist for your back please let us know. We will get a mammogram done. Please start taking the BuSpar 5 mg twice daily.  If this makes you drowsy please let us know.

## 2017-02-23 NOTE — Assessment & Plan Note (Addendum)
Chronic intermittent issue.  She has not appropriately been taking BuSpar.  She does report some depression.  No SI.  Discussed taking BuSpar 5 mg twice daily.  A new prescription was sent to her pharmacy.  If it makes her drowsy she will let us know.  I also discussed Wellbutrin given her fear of serotonin syndrome while being on the tramadol.  She is unsure of this and will let us know and she was given patient information on Wellbutrin.

## 2017-02-23 NOTE — Assessment & Plan Note (Signed)
Chronic issue.  Taking tramadol with good benefit.  Discussed referral for third opinion or to see physiatry to consider injections though she declined.  If she changes her mind she will let us know

## 2017-02-25 ENCOUNTER — Other Ambulatory Visit: Payer: Self-pay | Admitting: Family Medicine

## 2017-02-25 MED FILL — POTASSIUM CL ER 20 MEQ TAB: 20 | 30 days supply | Qty: 30 | Fill #0

## 2017-02-25 MED FILL — traMADol HCL 50 MG TABS: 50 | 23 days supply | Qty: 90 | Fill #0

## 2017-03-02 ENCOUNTER — Encounter: Payer: Self-pay | Admitting: Gastroenterology

## 2017-04-01 ENCOUNTER — Other Ambulatory Visit: Payer: Self-pay | Admitting: Family Medicine

## 2017-04-01 MED FILL — busPIRone HCL 5 MG TABS: 5 | 30 days supply | Qty: 60 | Fill #1

## 2017-04-01 MED FILL — OMEPRAZOLE 20 MG CAP: 20 | 30 days supply | Qty: 30 | Fill #1

## 2017-04-15 MED FILL — traMADol HCL 50 MG TABS: 50 | 23 days supply | Qty: 90 | Fill #1

## 2017-04-15 MED FILL — POTASSIUM CL ER 20 MEQ TABL: 20 | 30 days supply | Qty: 30 | Fill #0

## 2017-04-28 ENCOUNTER — Encounter: Payer: Self-pay | Admitting: Family Medicine

## 2017-04-28 ENCOUNTER — Other Ambulatory Visit: Payer: Self-pay

## 2017-04-28 ENCOUNTER — Ambulatory Visit: Payer: 59 | Admitting: Family Medicine

## 2017-04-28 VITALS — BP 140/82 | HR 95 | Temp 97.6°F | Wt 150.6 lb

## 2017-04-28 DIAGNOSIS — Z1231 Encounter for screening mammogram for malignant neoplasm of breast: Secondary | ICD-10-CM | POA: Diagnosis not present

## 2017-04-28 DIAGNOSIS — M545 Low back pain: Secondary | ICD-10-CM

## 2017-04-28 DIAGNOSIS — G8929 Other chronic pain: Secondary | ICD-10-CM

## 2017-04-28 DIAGNOSIS — E876 Hypokalemia: Secondary | ICD-10-CM | POA: Diagnosis not present

## 2017-04-28 DIAGNOSIS — Z1239 Encounter for other screening for malignant neoplasm of breast: Secondary | ICD-10-CM

## 2017-04-28 DIAGNOSIS — F329 Major depressive disorder, single episode, unspecified: Secondary | ICD-10-CM

## 2017-04-28 DIAGNOSIS — R634 Abnormal weight loss: Secondary | ICD-10-CM

## 2017-04-28 DIAGNOSIS — F419 Anxiety disorder, unspecified: Secondary | ICD-10-CM | POA: Diagnosis not present

## 2017-04-28 DIAGNOSIS — F32A Depression, unspecified: Secondary | ICD-10-CM

## 2017-04-28 LAB — BASIC METABOLIC PANEL
BUN: 9 mg/dL (ref 6–23)
CO2: 29 mEq/L (ref 19–32)
Calcium: 9.5 mg/dL (ref 8.4–10.5)
Chloride: 102 mEq/L (ref 96–112)
Creatinine, Ser: 0.52 mg/dL (ref 0.40–1.20)
GFR: 131.6 mL/min (ref >60.00)
Glucose, Bld: 87 mg/dL (ref 70–99)
Potassium: 4.5 mEq/L (ref 3.5–5.1)
Sodium: 135 mEq/L (ref 135–145)

## 2017-04-28 MED ORDER — BUPROPION HCL ER (XL) 150 MG PO TB24: 150.0000 mg | ORAL_TABLET | Freq: Every day | ORAL | 3 refills | Status: DC

## 2017-04-28 MED ORDER — BUSPIRONE HCL 7.5 MG PO TABS: 7.5000 mg | ORAL_TABLET | Freq: Two times a day (BID) | ORAL | 2 refills | Status: DC

## 2017-04-28 MED FILL — busPIRone HCL 7.5 MG TABS: 7.5 | 30 days supply | Qty: 60 | Fill #0

## 2017-04-28 MED FILL — buPROPion HCL ER (XL) 150 M: 150 | 30 days supply | Qty: 30 | Fill #0

## 2017-04-28 NOTE — Assessment & Plan Note (Signed)
Stable.  Tramadol is beneficial.  Again I discussed referral for injections though she is hesitant.

## 2017-04-28 NOTE — Patient Instructions (Signed)
Nice to see you. We are going to increase your BuSpar dose and add Wellbutrin to treat anxiety and depression respectively. Please monitor your chronic back pain and if you would like to see a specialist please let us know. If you develop thoughts of harming yourself please go to the emergency room.

## 2017-04-28 NOTE — Assessment & Plan Note (Signed)
Intermittently an issue previously with urine evaluation and lab evaluation.  She has been on chronic potassium supplementation.  We will recheck a BMP today.  Will need to consider nephrology referral.

## 2017-04-28 NOTE — Assessment & Plan Note (Signed)
Improved initially with the BuSpar though has worsened somewhat again.  Has anxiety and depression.  No SI.  Discussed adding Wellbutrin.  Discussed increasing BuSpar dose.  Offered referral to therapy though she did not think this would be beneficial.  She will follow-up in 3 months.  If not improving she will let us know.  Given return precautions.

## 2017-04-28 NOTE — Progress Notes (Signed)
Tommi Rumps, MD Phone: [phone]  Christina Galvan is a 52 y.o. female who presents today for follow-up.  Anxiety/depression: Patient notes mostly anxiety though some depression.  She has been living with her mom and her family has been quite rude.  Her kids make her anxious as well.  She notes no SI.  She has been on BuSpar 5 mg twice daily and noted a difference at first though none since then.  She is not sure a therapist would be beneficial.  She would consider Wellbutrin.  She has chronic low back pain: Takes tramadol 1-4 times daily which helps take the edge off.  Does radiate down the lateral anterior aspect of her left leg.  No numbness, weakness, loss of bowel or bladder function, or saddle anesthesia.  She tried Tylenol though that was not beneficial and she also switched to ibuprofen which caused a little bit of stomach upset and she discontinued it and that went away.  She does report issues in the past with hypokalemia.  Initially started after being placed on chlorthalidone and she was hospitalized for that.  Potassium has been up and down since then and she underwent evaluation with 24-hour urine collection which appears to been normal.  She has been on daily potassium supplement since that time.  She wonders if she will need to stay on the potassium supplementation long-term.  Social History   Tobacco Use  Smoking Status Current Every Day Smoker  . Packs/day: 0.50  . Years: 30.00  . Pack years: 15.00  . Types: Cigarettes  . Start date: 10/21/1984  Smokeless Tobacco Never Used     ROS see history of present illness  Objective  Physical Exam Vitals:   04/28/17 1403  BP: 140/82  Pulse: 95  Temp: 97.6 F (36.4 C)  SpO2: 97%    BP Readings from Last 3 Encounters:  04/28/17 140/82  02/23/17 130/70  02/12/17 (!) 101/58   Wt Readings from Last 3 Encounters:  04/28/17 150 lb 9.6 oz (68.3 kg)  02/23/17 148 lb 12.8 oz (67.5 kg)  02/12/17 143 lb (64.9 kg)     Physical Exam  Constitutional: No distress.  Cardiovascular: Normal rate, regular rhythm and normal heart sounds.  Pulmonary/Chest: Effort normal and breath sounds normal.  Musculoskeletal: She exhibits no edema.  Neurological: She is alert. Gait normal.  Rises from a seated position with no difficulty, gets on the exam table with no difficulty  Skin: Skin is warm and dry. She is not diaphoretic.     Assessment/Plan: Please see individual problem list.  Anxiety and depression Improved initially with the BuSpar though has worsened somewhat again.  Has anxiety and depression.  No SI.  Discussed adding Wellbutrin.  Discussed increasing BuSpar dose.  Offered referral to therapy though she did not think this would be beneficial.  She will follow-up in 3 months.  If not improving she will let us know.  Given return precautions.  Unintentional weight loss Weight has continued to increase.  Suspect weight loss is related to anxiety and depression.  Mammogram has not been completed and has been scheduled today.  Chronic back pain Stable.  Tramadol is beneficial.  Again I discussed referral for injections though she is hesitant.  Hypokalemia Intermittently an issue previously with urine evaluation and lab evaluation.  She has been on chronic potassium supplementation.  We will recheck a BMP today.  Will need to consider nephrology referral.   Orders Placed This Encounter  Procedures  . MM  Digital Screening    Standing Status:   Future    Standing Expiration Date:   06/27/2018    Order Specific Question:   Reason for Exam (SYMPTOM  OR DIAGNOSIS REQUIRED)    Answer:   screening    Order Specific Question:   Is the patient pregnant?    Answer:   No    Order Specific Question:   Preferred imaging location?    Answer:   [facility] ([facility])    Meds ordered this encounter  Medications  . busPIRone (BUSPAR) 7.5 MG tablet    Sig: Take 1 tablet (7.5 mg total)  by mouth 2 (two) times daily.    Dispense:  60 tablet    Refill:  2  . buPROPion (WELLBUTRIN XL) 150 MG 24 hr tablet    Sig: Take 1 tablet (150 mg total) by mouth daily.    Dispense:  30 tablet    Refill:  [place], MD [place]

## 2017-04-28 NOTE — Assessment & Plan Note (Signed)
Weight has continued to increase.  Suspect weight loss is related to anxiety and depression.  Mammogram has not been completed and has been scheduled today.

## 2017-04-29 MED FILL — OMEPRAZOLE 20 MG CAP: 20 | 30 days supply | Qty: 30 | Fill #2

## 2017-05-04 ENCOUNTER — Other Ambulatory Visit: Payer: Self-pay | Admitting: Family Medicine

## 2017-05-04 DIAGNOSIS — E876 Hypokalemia: Secondary | ICD-10-CM

## 2017-05-18 ENCOUNTER — Other Ambulatory Visit (INDEPENDENT_AMBULATORY_CARE_PROVIDER_SITE_OTHER): Payer: 59

## 2017-05-18 DIAGNOSIS — E876 Hypokalemia: Secondary | ICD-10-CM

## 2017-05-19 LAB — BASIC METABOLIC PANEL
BUN: 14 mg/dL (ref 6–23)
CALCIUM: 8.9 mg/dL (ref 8.4–10.5)
CHLORIDE: 105 meq/L (ref 96–112)
CO2: 27 mEq/L (ref 19–32)
CREATININE: 0.55 mg/dL (ref 0.40–1.20)
GFR: 123.32 mL/min (ref >60.00)
Glucose, Bld: 79 mg/dL (ref 70–99)
Potassium: 3.5 mEq/L (ref 3.5–5.1)
Sodium: 138 mEq/L (ref 135–145)

## 2017-05-22 ENCOUNTER — Other Ambulatory Visit: Payer: Self-pay | Admitting: Family Medicine

## 2017-05-22 DIAGNOSIS — E876 Hypokalemia: Secondary | ICD-10-CM

## 2017-05-22 MED ORDER — POTASSIUM CHLORIDE CRYS ER 20 MEQ PO TBCR: 20.0000 meq | EXTENDED_RELEASE_TABLET | Freq: Every day | ORAL | 0 refills | Status: DC

## 2017-05-24 MED FILL — POTASSIUM CL ER 20 MEQ TABL: 20 | 30 days supply | Qty: 30 | Fill #0

## 2017-05-25 MED FILL — TRANDOLAPRIL 4 MG TABLET: 4 | 90 days supply | Qty: 90 | Fill #1

## 2017-05-27 ENCOUNTER — Other Ambulatory Visit: Payer: Self-pay | Admitting: Family Medicine

## 2017-05-27 NOTE — Telephone Encounter (Signed)
Last OV 04/28/17 last filled 02/23/17 90 1rf

## 2017-05-28 ENCOUNTER — Other Ambulatory Visit (INDEPENDENT_AMBULATORY_CARE_PROVIDER_SITE_OTHER): Payer: 59

## 2017-05-28 DIAGNOSIS — E876 Hypokalemia: Secondary | ICD-10-CM

## 2017-05-28 LAB — POTASSIUM: Potassium: 4.1 mEq/L (ref 3.5–5.1)

## 2017-05-28 MED FILL — traMADol HCL 50 MG TABS: 50 | 22 days supply | Qty: 90 | Fill #0

## 2017-06-02 MED FILL — OMEPRAZOLE 20 MG CAP: 20 | 30 days supply | Qty: 30 | Fill #3

## 2017-06-30 DIAGNOSIS — E876 Hypokalemia: Secondary | ICD-10-CM | POA: Diagnosis not present

## 2017-07-06 ENCOUNTER — Other Ambulatory Visit: Payer: Self-pay | Admitting: Family Medicine

## 2017-07-06 MED FILL — OMEPRAZOLE 20 MG CAP: 20 | 30 days supply | Qty: 30 | Fill #0

## 2017-07-06 MED FILL — busPIRone HCL 7.5 MG TABS: 7.5 | 30 days supply | Qty: 60 | Fill #1

## 2017-07-08 MED FILL — traMADol HCL 50 MG TABS: 50 | 22 days supply | Qty: 90 | Fill #1

## 2017-07-26 ENCOUNTER — Encounter: Payer: Self-pay | Admitting: Family Medicine

## 2017-07-26 ENCOUNTER — Ambulatory Visit: Payer: 59 | Admitting: Family Medicine

## 2017-07-26 ENCOUNTER — Other Ambulatory Visit: Payer: Self-pay

## 2017-07-26 VITALS — BP 120/80 | HR 84 | Temp 97.7°F | Ht 63.0 in | Wt 152.2 lb

## 2017-07-26 DIAGNOSIS — E876 Hypokalemia: Secondary | ICD-10-CM | POA: Diagnosis not present

## 2017-07-26 DIAGNOSIS — F419 Anxiety disorder, unspecified: Secondary | ICD-10-CM

## 2017-07-26 DIAGNOSIS — I10 Essential (primary) hypertension: Secondary | ICD-10-CM

## 2017-07-26 DIAGNOSIS — R079 Chest pain, unspecified: Secondary | ICD-10-CM

## 2017-07-26 DIAGNOSIS — F329 Major depressive disorder, single episode, unspecified: Secondary | ICD-10-CM | POA: Diagnosis not present

## 2017-07-26 DIAGNOSIS — F32A Depression, unspecified: Secondary | ICD-10-CM

## 2017-07-26 MED ORDER — SERTRALINE HCL 50 MG PO TABS: 50.0000 mg | ORAL_TABLET | Freq: Every day | ORAL | 3 refills | Status: DC

## 2017-07-26 MED FILL — SERTRALINE HCL 50 MG TABLET: 50 | 30 days supply | Qty: 30 | Fill #0

## 2017-07-26 NOTE — Assessment & Plan Note (Signed)
Unchanged.  She will discontinue BuSpar.  Will start her on Zoloft.  Return in 3 months.  I did discuss that it may take up to 2 months for there to be a good benefit from this.

## 2017-07-26 NOTE — Assessment & Plan Note (Signed)
Adequately controlled.  Continue current regimen. 

## 2017-07-26 NOTE — Assessment & Plan Note (Signed)
Intermittent issues with this.  Appears to have improved to some degree with frequency being less.  Discussed that she has risk factors for cardiac issues.  Advised referral to cardiology given the exertional component.  Discussed EKG though she did not want to pay for this twice and she will have one with cardiology.  Advised of return precautions.

## 2017-07-26 NOTE — Progress Notes (Signed)
Patient states she will reschedule, she states she had a family emergency and was unable to go but will reschedule

## 2017-07-26 NOTE — Progress Notes (Signed)
Tommi Rumps, MD Phone: [phone]  Christina Galvan is a 52 y.o. female who presents today for f/u.  CC: htn, anxiety, depression, hypokalemia  HYPERTENSION  Disease Monitoring  Home BP Monitoring well controlled Chest pain- yes, see below     Dyspnea- no Medications  Compliance-  Taking mavik.   Anxiety: Notes her depression is somewhat better but her anxiety remains poorly controlled.  She stopped the Wellbutrin after a week.  She continues on the BuSpar but it has not been beneficial.  She notes no SI.  She notes being in the car or anytime something is out of the ordinary she will have anxiety and occasionally panic.  The therapist was not helpful.  She saw nephrology for her hypokalemia.  They thought it was related to her eating habits and advised her to take the potassium supplement if she did not eat.  She thinks her eating a be off related to her anxiety.  She reports intermittent chest pressure that occurs centrally.  This has been going on for some time now.  Previously was occurring about 4 times per week though over the last month or so with only occurred twice.  Occurs with walking at work.  Lasts about 5 minutes and resolves with rest.  No radiation.  No diaphoresis.  No shortness of breath.  She is never been evaluated by cardiology.  No recent EKG.    Social History   Tobacco Use  Smoking Status Current Every Day Smoker  . Packs/day: 0.50  . Years: 30.00  . Pack years: 15.00  . Types: Cigarettes  . Start date: 10/21/1984  Smokeless Tobacco Never Used     ROS see history of present illness  Objective  Physical Exam Vitals:   07/26/17 1449  BP: 120/80  Pulse: 84  Temp: 97.7 F (36.5 C)  SpO2: 98%    BP Readings from Last 3 Encounters:  07/26/17 120/80  04/28/17 140/82  02/23/17 130/70   Wt Readings from Last 3 Encounters:  07/26/17 152 lb 3.2 oz (69 kg)  04/28/17 150 lb 9.6 oz (68.3 kg)  02/23/17 148 lb 12.8 oz (67.5 kg)    Physical  Exam  Constitutional: No distress.  Cardiovascular: Normal rate, regular rhythm and normal heart sounds.  Pulmonary/Chest: Effort normal and breath sounds normal.  Musculoskeletal: She exhibits no edema.  Neurological: She is alert.  Skin: Skin is warm and dry. She is not diaphoretic.     Assessment/Plan: Please see individual problem list.  Exertional chest pain Intermittent issues with this.  Appears to have improved to some degree with frequency being less.  Discussed that she has risk factors for cardiac issues.  Advised referral to cardiology given the exertional component.  Discussed EKG though she did not want to pay for this twice and she will have one with cardiology.  Advised of return precautions.  Hypokalemia Evaluated by nephrology.  Possibly related to poor p.o. intake at times.  She will follow-up with them as planned.  Anxiety and depression Unchanged.  She will discontinue BuSpar.  Will start her on Zoloft.  Return in 3 months.  I did discuss that it may take up to 2 months for there to be a good benefit from this.  Essential hypertension Adequately controlled.  Continue current regimen.   Health Maintenance: CMA to contact patient to set mammogram up.   Orders Placed This Encounter  Procedures  . Ambulatory referral to Cardiology    Referral Priority:   Routine  Referral Type:   Consultation    Referral Reason:   Specialty Services Required    Requested Specialty:   Cardiology    Number of Visits Requested:   1    Meds ordered this encounter  Medications  . sertraline (ZOLOFT) 50 MG tablet    Sig: Take 1 tablet (50 mg total) by mouth daily.    Dispense:  30 tablet    Refill:  [place], MD [place]

## 2017-07-26 NOTE — Assessment & Plan Note (Signed)
Evaluated by nephrology.  Possibly related to poor p.o. intake at times.  She will follow-up with them as planned.

## 2017-07-26 NOTE — Patient Instructions (Signed)
Nice to see you. We will start you on Zoloft for your anxiety and depression.  It may take up to 8 weeks for this to provide some benefit. We will get you to see cardiology for evaluation of your chest pain.  If this becomes more frequent or if it is persistent please seek medical attention immediately.

## 2017-08-09 MED FILL — OMEPRAZOLE 20 MG CAP: 20 | 30 days supply | Qty: 30 | Fill #1

## 2017-08-19 ENCOUNTER — Other Ambulatory Visit: Payer: Self-pay | Admitting: Family Medicine

## 2017-08-20 MED FILL — traMADol HCL 50 MG TABS: 50 | 23 days supply | Qty: 90 | Fill #0

## 2017-08-20 NOTE — Telephone Encounter (Signed)
Refill sent to pharmacy.  Drug database reviewed. 

## 2017-08-20 NOTE — Telephone Encounter (Signed)
Last OV 07/26/2017 last filled 05/28/17 90 1rf

## 2017-08-26 ENCOUNTER — Other Ambulatory Visit: Payer: Self-pay | Admitting: Family Medicine

## 2017-08-26 MED FILL — TRANDOLAPRIL 4 MG TABLET: 4 | 90 days supply | Qty: 90 | Fill #0

## 2017-08-26 MED FILL — SERTRALINE HCL 50 MG TABS: 50 | 30 days supply | Qty: 30 | Fill #1

## 2017-08-26 NOTE — Telephone Encounter (Signed)
Last OV 07/26/17 last filled 02/18/17 90 1rf

## 2017-09-16 DIAGNOSIS — H524 Presbyopia: Secondary | ICD-10-CM | POA: Diagnosis not present

## 2017-09-16 DIAGNOSIS — H5213 Myopia, bilateral: Secondary | ICD-10-CM | POA: Diagnosis not present

## 2017-09-16 DIAGNOSIS — H25013 Cortical age-related cataract, bilateral: Secondary | ICD-10-CM | POA: Diagnosis not present

## 2017-09-16 DIAGNOSIS — H52223 Regular astigmatism, bilateral: Secondary | ICD-10-CM | POA: Diagnosis not present

## 2017-09-17 MED FILL — OMEPRAZOLE 20 MG CPDR: 20 | 30 days supply | Qty: 30 | Fill #2

## 2017-09-24 MED FILL — SERTRALINE HCL 50 MG TABLET: 50 | 30 days supply | Qty: 30 | Fill #2

## 2017-09-28 ENCOUNTER — Ambulatory Visit: Payer: 59 | Admitting: Cardiovascular Disease

## 2017-09-30 MED FILL — traMADol HCL 50 MG TABS: 50 | 23 days supply | Qty: 90 | Fill #1

## 2017-10-21 MED FILL — OMEPRAZOLE 20 MG CPDR: 20 | 30 days supply | Qty: 30 | Fill #3

## 2017-10-27 ENCOUNTER — Ambulatory Visit: Payer: 59 | Admitting: Family Medicine

## 2017-10-28 MED FILL — SERTRALINE HCL 50 MG TABLET: 50 | 30 days supply | Qty: 30 | Fill #3

## 2017-11-10 ENCOUNTER — Other Ambulatory Visit: Payer: Self-pay | Admitting: Family Medicine

## 2017-11-12 ENCOUNTER — Other Ambulatory Visit: Payer: Self-pay | Admitting: Family Medicine

## 2017-11-15 MED FILL — traMADol HCL 50 MG TABS: 50 | 7 days supply | Qty: 30 | Fill #0

## 2017-11-15 NOTE — Telephone Encounter (Signed)
xall pt  I have refilled your medication, tramadol  for 30 days with no refill.   I am covering your provider while out of this office.   Please be sure to follow up with provider for next refill in which they can give refills, etc as typically prescribed  Joycelyn Schmid, NP

## 2017-11-15 NOTE — Telephone Encounter (Signed)
Okay to refill? Last written on: 08/20/17 for #90 with one refill.  LOV: 07/26/17 NOV: 02/14/18

## 2017-11-15 NOTE — Telephone Encounter (Signed)
Okay to refill? Last refilled on 08/20/17 for #90 with one refill.  LOV: 07/26/17 NOV: 02/14/18

## 2017-11-17 ENCOUNTER — Other Ambulatory Visit: Payer: Self-pay | Admitting: Family Medicine

## 2017-11-17 NOTE — Telephone Encounter (Addendum)
Patient already has appt scheduled 02/14/18 @215pm 

## 2017-11-18 MED FILL — OMEPRAZOLE 20 MG CPDR: 20 | 30 days supply | Qty: 30 | Fill #0

## 2017-11-21 NOTE — Progress Notes (Signed)
Cardiology Office Note  Date:  11/23/2017   ID:  Christina Galvan, DOB May 24, 1965, MRN [id number]  PCP:  Leone Haven, MD   Chief Complaint  Patient presents with  . other    Ref by Dr. [name] for exertional chest pain. Meds reviewed by the pt. verbally. Pt. c/o chest pain that comes and goes, rapid irreg. & pounding heart beats, and shortness of breath.     HPI:  Ms. Christina Galvan is a 52 year old woman with past medical history of Anxiety/ panic attacks Depression HTN Hyperlipidemia smoker Referred by Dr. [name] for consultation of her chest pain on exertion  "Started ever since potassium dropped low 2.4 in 2017" Since then she has been very anxious about how she feels  Sx below for 1 -2 months Tired/exhausted, Fast heart beat Sometimes has to lay down, "feels bad" Chest tight  Seen by PMD in 07/2017 At that time she reported chest discomfort central chest,  4 times per week though over the last month or so with only occurred twice.   Occurs with walking at work.  Lasts about 5 minutes and resolves with rest.    EKG personally reviewed by myself on todays visit Shows normal sinus rhythm rate 85 bpm no significant ST-T wave changes  Family hx Mother with atrial fib, CVA Father died from cancer   PMH:   has a past medical history of Anxiety, GERD (gastroesophageal reflux disease), History of frequent urinary tract infections, Hypertension, Kidney stones, and Migraines.  PSH:    Past Surgical History:  Procedure Laterality Date  . carpal tunnel    . CESAREAN SECTION  1993  . CHOLECYSTECTOMY  1994  . COLONOSCOPY WITH PROPOFOL N/A 02/12/2017   Procedure: COLONOSCOPY WITH PROPOFOL;  Surgeon: Jonathon Bellows, MD;  Location: [facility] ENDOSCOPY;  Service: Gastroenterology;  Laterality: N/A;    Current Outpatient Medications  Medication Sig Dispense Refill  . MAGNESIUM CHLORIDE PO Take by mouth.    Marland Kitchen omeprazole (PRILOSEC) 20 MG capsule TAKE 1 CAPSULE BY MOUTH  ONCE DAILY 30 capsule 2  . sertraline (ZOLOFT) 50 MG tablet Take 1 tablet (50 mg total) by mouth daily. 30 tablet 3  . traMADol (ULTRAM) 50 MG tablet TAKE 1 TABLET BY MOUTH EVERY 6 HOURS AS NEEDED 30 tablet 0  . trandolapril (MAVIK) 4 MG tablet TAKE 1 TABLET BY MOUTH ONCE DAILY 90 tablet 1   No current facility-administered medications for this visit.       Allergies:   Thiazide-type diuretics; Mushroom extract complex; and Sulfa antibiotics   Social History:  The patient  reports that she has been smoking cigarettes. She started smoking about 33 years ago. She has a 7.50 pack-year smoking history. She has never used smokeless tobacco. She reports that she drinks alcohol. She reports that she does not use drugs.   Family History:   family history includes Arrhythmia in her mother; Arthritis in her mother; Asthma in her daughter; Cancer in her father and paternal uncle; Diabetes in her brother and mother; Heart disease in her maternal grandfather, mother, and paternal grandfather; Hypertension in her mother.    Review of Systems: Review of Systems  Constitutional: Negative.   Respiratory: Negative.   Cardiovascular: Positive for chest pain and palpitations.  Gastrointestinal: Negative.   Musculoskeletal: Negative.   Neurological: Negative.   Psychiatric/Behavioral: Negative.   All other systems reviewed and are negative.    PHYSICAL EXAM: VS:  BP 130/80 (BP Location: Right Arm, Patient Position: Sitting, Cuff Size:  Normal)   Pulse 78   Ht 5\' 3"  (1.6 m)   Wt 144 lb 8 oz (65.5 kg)   BMI 25.60 kg/m  , BMI Body mass index is 25.6 kg/m. GEN: Well nourished, well developed, in no acute distress  HEENT: normal  Neck: no JVD, carotid bruits, or masses Cardiac: RRR; no murmurs, rubs, or gallops,no edema  Respiratory:  clear to auscultation bilaterally, normal work of breathing GI: soft, nontender, nondistended, + BS MS: no deformity or atrophy  Skin: warm and dry, no rash Neuro:   Strength and sensation are intact Psych: euthymic mood, full affect    Recent Labs: 11/26/2016: ALT 9; Hemoglobin 14.7; Platelets 281.0; TSH 0.62 05/18/2017: BUN 14; Creatinine, Ser 0.55; Sodium 138 05/28/2017: Potassium 4.1    Lipid Panel Lab Results  Component Value Date   CHOL 177 08/24/2016   HDL 32.20 (L) 08/24/2016   LDLCALC 121 (H) 08/24/2016   TRIG 120.0 08/24/2016      Wt Readings from Last 3 Encounters:  11/23/17 144 lb 8 oz (65.5 kg)  07/26/17 152 lb 3.2 oz (69 kg)  04/28/17 150 lb 9.6 oz (68.3 kg)       ASSESSMENT AND PLAN:  Exertional chest pain - Plan: EKG 12-Lead Etiology unclear Risk factors include smoking Reports she is able to treadmill We have ordered treadmill echo stress test to rule out ischemia  Anxiety and depression Reports she continues to have anxiety and panic Possibly contributing to tachycardia and palpitations  Tobacco abuse We have provided her with Chantix Discussed other strategies for smoking cessation 1 5 minutes spent discussing with her  Mixed hyperlipidemia - Plan: EKG 12-Lead Reasonable numbers 177 total cholesterol on no medication Stress test as above  Disposition:   F/U as needed  Patient was seen in consultation for Dr. Biagio Quint and will be referred back to his office for ongoing care of the issues detailed above   Total encounter time more than 60 minutes  Greater than 50% was spent in counseling and coordination of care with the patient    Orders Placed This Encounter  Procedures  . EKG 12-Lead     Signed, Esmond Plants, M.D., Ph.D. 11/23/2017  [place], [place]

## 2017-11-23 ENCOUNTER — Encounter: Payer: Self-pay | Admitting: Cardiovascular Disease

## 2017-11-23 ENCOUNTER — Encounter

## 2017-11-23 ENCOUNTER — Ambulatory Visit: Payer: 59 | Admitting: Cardiovascular Disease

## 2017-11-23 VITALS — BP 130/80 | HR 78 | Ht 63.0 in | Wt 144.5 lb

## 2017-11-23 DIAGNOSIS — I10 Essential (primary) hypertension: Secondary | ICD-10-CM | POA: Diagnosis not present

## 2017-11-23 DIAGNOSIS — Z720 Tobacco use: Secondary | ICD-10-CM | POA: Diagnosis not present

## 2017-11-23 DIAGNOSIS — F329 Major depressive disorder, single episode, unspecified: Secondary | ICD-10-CM | POA: Diagnosis not present

## 2017-11-23 DIAGNOSIS — E782 Mixed hyperlipidemia: Secondary | ICD-10-CM | POA: Diagnosis not present

## 2017-11-23 DIAGNOSIS — F32A Depression, unspecified: Secondary | ICD-10-CM

## 2017-11-23 DIAGNOSIS — F419 Anxiety disorder, unspecified: Secondary | ICD-10-CM

## 2017-11-23 DIAGNOSIS — R079 Chest pain, unspecified: Secondary | ICD-10-CM | POA: Diagnosis not present

## 2017-11-23 NOTE — Patient Instructions (Addendum)
Medication Instructions:   No medication changes made  Labwork:  No new labs needed  Testing/Procedures:  We will schedule a treadmill stress echo for chest pain, fast heart beats, fatigue  Do not drink or eat foods with caffeine for 24 hours before the test. (Chocolate, coffee, tea, or energy drinks)  If you use an inhaler, bring it with you to the test.  Do not smoke for 4 hours before the test.  Wear comfortable shoes and clothing.  Follow-Up: It was a pleasure seeing you in the office today. Please call us if you have new issues that need to be addressed before your next appt.  [phone]  Your physician wants you to follow-up in:  As needed  If you need a refill on your cardiac medications before your next appointment, please call your pharmacy.  For educational health videos Log in to : [email or web address] Or : [email or web address], password : [email or web address]

## 2017-11-26 ENCOUNTER — Other Ambulatory Visit: Payer: Self-pay | Admitting: Family Medicine

## 2017-11-26 MED FILL — SERTRALINE HCL 50 MG TABLET: 50 | 30 days supply | Qty: 30 | Fill #0

## 2017-11-26 MED FILL — TRANDOLAPRIL 4 MG TABLET: 4 | 90 days supply | Qty: 90 | Fill #1

## 2017-12-10 ENCOUNTER — Other Ambulatory Visit: Payer: 59

## 2017-12-15 ENCOUNTER — Other Ambulatory Visit: Payer: Self-pay | Admitting: Family

## 2017-12-16 MED FILL — traMADol HCL 50 MG TABS: 50 | 8 days supply | Qty: 30 | Fill #0

## 2017-12-16 NOTE — Telephone Encounter (Signed)
Sent to pharmacy.  Controlled substance database reviewed. 

## 2017-12-16 NOTE — Telephone Encounter (Signed)
Last refill 11/15/17 Last office visit 07/26/17 Next office visit 02/14/18

## 2017-12-22 ENCOUNTER — Telehealth: Payer: Self-pay | Admitting: *Deleted

## 2017-12-22 NOTE — Telephone Encounter (Signed)
Call placed to the patient to go over instructions for her stress echo tomorrow. There was no answer.

## 2017-12-23 ENCOUNTER — Ambulatory Visit (INDEPENDENT_AMBULATORY_CARE_PROVIDER_SITE_OTHER): Payer: 59

## 2017-12-23 DIAGNOSIS — R079 Chest pain, unspecified: Secondary | ICD-10-CM | POA: Diagnosis not present

## 2017-12-24 LAB — ECHOCARDIOGRAM STRESS TEST
CHL CUP MPHR: 168 {beats}/min
CSEPED: 3 min
CSEPEW: 4.8 METS
CSEPPHR: 181 {beats}/min
Exercise duration (sec): 12 s
Percent HR: 107 %
Rest HR: 79 {beats}/min

## 2017-12-27 ENCOUNTER — Telehealth: Payer: Self-pay | Admitting: Cardiovascular Disease

## 2017-12-27 NOTE — Telephone Encounter (Signed)
I left a message for the patient to call regarding her stress echo results.

## 2017-12-28 NOTE — Telephone Encounter (Signed)
Patient returning our call

## 2017-12-28 NOTE — Telephone Encounter (Signed)
Reviewed results and recommendations with patient and she verbalized understanding with no further questions at this time.  

## 2017-12-29 ENCOUNTER — Encounter (HOSPITAL_COMMUNITY): Payer: Self-pay | Admitting: Emergency Medicine

## 2017-12-29 ENCOUNTER — Other Ambulatory Visit: Payer: Self-pay

## 2017-12-29 ENCOUNTER — Emergency Department (HOSPITAL_COMMUNITY)
Admission: EM | Admit: 2017-12-29 | Discharge: 2017-12-29 | Disposition: A | Discharge status: Home / Self Care | Payer: 59 | Attending: Emergency Medicine | Admitting: Emergency Medicine

## 2017-12-29 DIAGNOSIS — X500XXA Overexertion from strenuous movement or load, initial encounter: Secondary | ICD-10-CM | POA: Diagnosis not present

## 2017-12-29 DIAGNOSIS — Y929 Unspecified place or not applicable: Secondary | ICD-10-CM | POA: Insufficient documentation

## 2017-12-29 DIAGNOSIS — S3992XA Unspecified injury of lower back, initial encounter: Secondary | ICD-10-CM | POA: Diagnosis present

## 2017-12-29 DIAGNOSIS — I10 Essential (primary) hypertension: Secondary | ICD-10-CM | POA: Diagnosis not present

## 2017-12-29 DIAGNOSIS — M5442 Lumbago with sciatica, left side: Secondary | ICD-10-CM | POA: Insufficient documentation

## 2017-12-29 DIAGNOSIS — Z79899 Other long term (current) drug therapy: Secondary | ICD-10-CM | POA: Diagnosis not present

## 2017-12-29 DIAGNOSIS — S39012A Strain of muscle, fascia and tendon of lower back, initial encounter: Secondary | ICD-10-CM | POA: Insufficient documentation

## 2017-12-29 DIAGNOSIS — Y999 Unspecified external cause status: Secondary | ICD-10-CM | POA: Diagnosis not present

## 2017-12-29 DIAGNOSIS — Y939 Activity, unspecified: Secondary | ICD-10-CM | POA: Insufficient documentation

## 2017-12-29 DIAGNOSIS — M5432 Sciatica, left side: Secondary | ICD-10-CM

## 2017-12-29 DIAGNOSIS — F17210 Nicotine dependence, cigarettes, uncomplicated: Secondary | ICD-10-CM | POA: Insufficient documentation

## 2017-12-29 HISTORY — DX: Major depressive disorder, single episode, unspecified: F32.9

## 2017-12-29 HISTORY — DX: Depression, unspecified: F32.A

## 2017-12-29 HISTORY — DX: Anxiety disorder, unspecified: F41.9

## 2017-12-29 HISTORY — DX: Other specified anxiety disorders: F41.8

## 2017-12-29 MED ORDER — LIDOCAINE 5 % EX PTCH
1.0000 | MEDICATED_PATCH | CUTANEOUS | Status: DC
  Administered 2017-12-29: 1 via TRANSDERMAL
  Filled 2017-12-29: qty 1

## 2017-12-29 MED ORDER — IBUPROFEN 800 MG PO TABS: 800.0000 mg | ORAL_TABLET | Freq: Three times a day (TID) | ORAL | 0 refills | Status: AC

## 2017-12-29 MED ORDER — CYCLOBENZAPRINE HCL 10 MG PO TABS: 10.0000 mg | ORAL_TABLET | Freq: Three times a day (TID) | ORAL | 0 refills | Status: DC | PRN

## 2017-12-29 MED ORDER — KETOROLAC TROMETHAMINE 60 MG/2ML IM SOLN
60.0000 mg | Freq: Once | INTRAMUSCULAR | Status: AC
  Administered 2017-12-29: 60 mg via INTRAMUSCULAR
  Filled 2017-12-29: qty 2

## 2017-12-29 MED ORDER — DIAZEPAM 2 MG PO TABS
2.0000 mg | ORAL_TABLET | Freq: Once | ORAL | Status: AC
  Administered 2017-12-29: 2 mg via ORAL
  Filled 2017-12-29: qty 1

## 2017-12-29 MED ORDER — LIDOCAINE 5 % EX PTCH: 1.0000 | MEDICATED_PATCH | CUTANEOUS | 0 refills | Status: AC

## 2017-12-29 MED FILL — OMEPRAZOLE 20 MG CPDR: 20 | 30 days supply | Qty: 30 | Fill #1

## 2017-12-29 MED FILL — CYCLOBENZAPRINE HCL 10 MG T: 10 | 7 days supply | Qty: 20 | Fill #0

## 2017-12-29 MED FILL — LIDOCAINE PATCH 5%: 5 | 15 days supply | Qty: 15 | Fill #0

## 2017-12-29 MED FILL — IBUPROFEN 800 MG TAB: 800 | 10 days supply | Qty: 30 | Fill #0

## 2017-12-29 MED FILL — SERTRALINE HCL 50 MG TABLET: 50 | 30 days supply | Qty: 30 | Fill #1

## 2017-12-29 NOTE — ED Triage Notes (Addendum)
Pt verbalizes recent cough believes to have pulled something in back when coughing or slept wrong; onset 2 days; pain from mid lower radiating down left leg. Denies GU symptoms. Hx of bulging disc.

## 2017-12-29 NOTE — ED Provider Notes (Signed)
[facility] DEPT Provider Note   CSN: [id number] Arrival date & time: 12/29/17  1138     History   Chief Complaint Chief Complaint  Patient presents with  . Back Pain    HPI Christina Galvan is a 52 y.o. female.  The history is provided by the patient.  Back Pain   This is a new problem. The current episode started more than 2 days ago. The problem occurs constantly. The problem has been gradually worsening. Associated with: Increased physical activity over the last few days. States she threw her back out again. Left lower back pain and with muscle spasm and intermittent pain down her left leg.  The pain is present in the lumbar spine. The quality of the pain is described as shooting. The pain radiates to the left foot. The pain is at a severity of 4/10. The pain is moderate. The symptoms are aggravated by certain positions, twisting and bending. Associated symptoms include tingling. Pertinent negatives include no chest pain, no fever, no numbness, no weight loss, no headaches, no abdominal pain, no abdominal swelling, no bowel incontinence, no perianal numbness, no bladder incontinence, no dysuria, no pelvic pain, no leg pain, no paresis and no weakness. Treatments tried: tylenol  The treatment provided no relief. Risk factors: Hx of disc bulges in low back.    Past Medical History:  Diagnosis Date  . Anxiety   . Anxiety and depression   . Depression with anxiety   . GERD (gastroesophageal reflux disease)   . History of frequent urinary tract infections   . Hypertension   . Kidney stones   . Migraines     Patient Active Problem List   Diagnosis Date Noted  . Unintentional weight loss 11/29/2016  . Hyperlipidemia 08/24/2016  . Hypokalemia 12/09/2015  . Exertional chest pain 11/30/2015  . Chronic back pain 10/31/2015  . Tobacco abuse 10/31/2015  . Essential hypertension 10/31/2015  . GERD (gastroesophageal reflux disease) 10/28/2014  .  Migraines 10/28/2014  . Anxiety and depression 10/28/2014    Past Surgical History:  Procedure Laterality Date  . carpal tunnel    . CESAREAN SECTION  1993  . CHOLECYSTECTOMY  1994  . COLONOSCOPY WITH PROPOFOL N/A 02/12/2017   Procedure: COLONOSCOPY WITH PROPOFOL;  Surgeon: Jonathon Bellows, MD;  Location: [facility] ENDOSCOPY;  Service: Gastroenterology;  Laterality: N/A;     OB History   None      Home Medications    Prior to Admission medications   Medication Sig Start Date End Date Taking? Authorizing Provider  omeprazole (PRILOSEC) 20 MG capsule TAKE 1 CAPSULE BY MOUTH ONCE DAILY Patient taking differently: Take 20 mg by mouth daily.  11/18/17  Yes Leone Haven, MD  sertraline (ZOLOFT) 50 MG tablet TAKE 1 TABLET BY MOUTH DAILY. Patient taking differently: Take 50 mg by mouth daily.  11/26/17  Yes Leone Haven, MD  traMADol (ULTRAM) 50 MG tablet TAKE 1 TABLET BY MOUTH EVERY 6 HOURS AS NEEDED Patient taking differently: Take 50 mg by mouth every 6 (six) hours as needed for moderate pain.  12/16/17  Yes Leone Haven, MD  trandolapril ([place]) 4 MG tablet TAKE 1 TABLET BY MOUTH ONCE DAILY Patient taking differently: Take 4 mg by mouth daily.  08/26/17  Yes Leone Haven, MD  cyclobenzaprine (FLEXERIL) 10 MG tablet Take 1 tablet (10 mg total) by mouth 3 (three) times daily as needed for up to 20 doses for muscle spasms. 12/29/17  Avrohom Mckelvin, DO  ibuprofen (ADVIL,MOTRIN) 800 MG tablet Take 1 tablet (800 mg total) by mouth 3 (three) times daily for 30 doses. 12/29/17 01/08/18  Oberon Hehir, DO  lidocaine (LIDODERM) 5 % Place 1 patch onto the skin daily for 15 days. Remove & Discard patch within 12 hours or as directed by MD 12/29/17 01/13/18  Lennice Sites, DO    Family History Family History  Problem Relation Age of Onset  . Arthritis Mother   . Heart disease Mother   . Hypertension Mother   . Diabetes Mother   . Arrhythmia Mother        A-Fib   . Cancer  Father        Lung Cancer  . Diabetes Brother   . Asthma Daughter   . Cancer Paternal Uncle        Prostate cancer  . Heart disease Maternal Grandfather   . Heart disease Paternal Grandfather     Social History Social History   Tobacco Use  . Smoking status: Current Every Day Smoker    Packs/day: 0.25    Years: 30.00    Pack years: 7.50    Types: Cigarettes    Start date: 10/21/1984  . Smokeless tobacco: Never Used  Substance Use Topics  . Alcohol use: Yes    Alcohol/week: 0.0 standard drinks    Comment: Rare  . Drug use: No     Allergies   Thiazide-type diuretics; Mushroom extract complex; and Sulfa antibiotics   Review of Systems Review of Systems  Constitutional: Negative for chills, fever and weight loss.  HENT: Negative for ear pain and sore throat.   Eyes: Negative for pain and visual disturbance.  Respiratory: Negative for cough and shortness of breath.   Cardiovascular: Negative for chest pain and palpitations.  Gastrointestinal: Negative for abdominal pain, bowel incontinence and vomiting.  Genitourinary: Negative for bladder incontinence, dysuria, hematuria and pelvic pain.  Musculoskeletal: Positive for back pain. Negative for arthralgias, gait problem, joint swelling, myalgias, neck pain and neck stiffness.  Skin: Negative for color change and rash.  Neurological: Positive for tingling. Negative for seizures, syncope, weakness, numbness and headaches.  All other systems reviewed and are negative.    Physical Exam Updated Vital Signs  ED Triage Vitals  Enc Vitals Group     BP 12/29/17 1149 (!) 132/91     Pulse Rate 12/29/17 1149 (!) 120     Resp 12/29/17 1149 18     Temp 12/29/17 1149 97.7 F (36.5 C)     Temp Source 12/29/17 1149 Oral     SpO2 12/29/17 1149 100 %     Weight --      Height --      Head Circumference --      Peak Flow --      Pain Score 12/29/17 1150 10     Pain Loc --      Pain Edu? --      Excl. in [place]? --     Physical  Exam  Constitutional: She is oriented to person, place, and time. She appears well-developed and well-nourished. No distress.  HENT:  Head: Normocephalic and atraumatic.  Eyes: Pupils are equal, round, and reactive to light. Conjunctivae and EOM are normal.  Neck: Normal range of motion. Neck supple.  Cardiovascular: Normal rate, regular rhythm, normal heart sounds and intact distal pulses.  No murmur heard. Pulmonary/Chest: Effort normal and breath sounds normal. No respiratory distress.  Abdominal: Soft. There is no tenderness.  Musculoskeletal: Normal range of motion. She exhibits tenderness (TTP in paraspinal lumbar muscles on left side with increased tone, TTP in left piriformis). She exhibits no edema.  Neurological: She is alert and oriented to person, place, and time. No cranial nerve deficit or sensory deficit. She exhibits normal muscle tone. Coordination normal.  5+/5 strength throughout, normal sensation throughout, antalgic gait  Skin: Skin is warm and dry.  Psychiatric: She has a normal mood and affect.  Nursing note and vitals reviewed.    ED Treatments / Results  Labs (all labs ordered are listed, but only abnormal results are displayed) Labs Reviewed - No data to display  EKG None  Radiology No results found.  Procedures Procedures (including critical care time)  Medications Ordered in ED Medications  lidocaine (LIDODERM) 5 % 1 patch (1 patch Transdermal Patch Applied 12/29/17 1409)  ketorolac (TORADOL) injection 60 mg (60 mg Intramuscular Given 12/29/17 1410)  diazepam (VALIUM) tablet 2 mg (2 mg Oral Given 12/29/17 1446)     Initial Impression / Assessment and Plan / ED Course  I have reviewed the triage vital signs and the nursing notes.  Pertinent labs & imaging results that were available during my care of the patient were reviewed by me and considered in my medical decision making (see chart for details).     MICHELLE WNEK is a 52 year old  female who presents to the ED with back pain.  Patient with unremarkable vitals except for mild tachycardia.  Patient with no fever.  Patient states that progressive onset of low back pain over the last 4 to 5 days.  Pain particularly got worse after cardiac stress test in which patient had to walk up an incline.  She states that when she woke up the next morning her low back was tight and spasm as in the past.  She does have a history of chronic back pain and she states that this seems to have exacerbated it.  She has had some sciatic type pain with intermittent shooting pains down into the left foot.  She denies any trauma.  She does work a job in which she is on her feet a lot.  Pain is mostly in the left lower back until she tries to stand up.  She denies any loss of bowel or bladder.  She denies any numbness, weakness.  She is able to ambulate but antalgicly.  Patient has normal neurological exam.  Normal strength, normal sensation.  Antalgic gait.  No concern for cauda equina on exam.  Patient does not have any midline spinal pain.  No history of trauma.  No concern for fracture. No fever, no IV drug use and no concern for infectious process.  She is mostly tender in the paraspinal muscles in the lumbar region where she has increased tone.  Fairly consistent with a muscle spasm, sciatica.  Patient has not been taking any pain medicine.  Educated her about the use of anti-inflammatories.  Gave patient IM Toradol shot, lidocaine patch, Valium in the ED.  Given prescription for Motrin and Flexeril and discharged from the ED in good condition.  Given strict return precautions. Recommend follow-up with primary care doctor.  This chart was dictated using voice recognition software.  Despite best efforts to proofread,  errors can occur which can change the documentation meaning.   Final Clinical Impressions(s) / ED Diagnoses   Final diagnoses:  Strain of lumbar region, initial encounter  Sciatica of left  side    ED Discharge  Orders         Ordered    ibuprofen (ADVIL,MOTRIN) 800 MG tablet  3 times daily     12/29/17 1504    cyclobenzaprine (FLEXERIL) 10 MG tablet  3 times daily PRN     12/29/17 1504    lidocaine (LIDODERM) 5 %  Every 24 hours     12/29/17 1504           Tien Aispuro, DO 12/29/17 1505

## 2018-01-06 ENCOUNTER — Encounter: Payer: Self-pay | Admitting: Family Medicine

## 2018-01-10 ENCOUNTER — Telehealth: Payer: Self-pay

## 2018-01-10 NOTE — Telephone Encounter (Signed)
Copied from [place] [id number]. Topic: General - Other >> Jan 10, 2018 10:13 AM Carolyn Stare wrote:  Pt call to say the FMLA paperwork was faxed over and is asking if they were received

## 2018-01-11 NOTE — Telephone Encounter (Signed)
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 Pt stated this is how she injured her back in 2002 was lifting a patient.  This past Wednesday she was lifting a patient 400-500 lbs along with 4 other people and she feels like she had re-injured her back at that time. She stated that day the pain hurt so badly that she was unable to sit or stand due to the pain. This pain is persistent and does not stop patient stated.  Pt stated that she is in a lot of pain but she has also been without her tramadol since she had to canceled her most resent OV that she had scheduled.   Pt is completley out of tramadol.   Sent to PCP

## 2018-01-11 NOTE — Telephone Encounter (Signed)
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 She said that she has siactic pain, since 2002 in her back. She said it is a lot of bending and lifting on this unit.

## 2018-01-11 NOTE — Telephone Encounter (Signed)
Please let the patient know that I did receive her FMLA paperwork.  I am working on filling this out.  Please find out what her job consists of and what issues she has completing her job when she has a back pain flare.  Thanks.

## 2018-01-12 MED ORDER — TRAMADOL HCL 50 MG PO TABS: 50.0000 mg | ORAL_TABLET | Freq: Four times a day (QID) | ORAL | 0 refills | Status: DC | PRN

## 2018-01-12 MED FILL — traMADol HCL 50 MG TABS: 50 | 8 days supply | Qty: 30 | Fill #0

## 2018-01-12 NOTE — Telephone Encounter (Addendum)
Noted.  Form completed.  Refill of tramadol is sent in.  Patient will need to schedule follow-up.  If her pain is continuing or worsening she should be evaluated at urgent care or in the office.

## 2018-01-12 NOTE — Telephone Encounter (Signed)
Form completed.  Please fax.

## 2018-01-12 NOTE — Addendum Note (Signed)
Addended by: [name], Jarius Dieudonne G on: 01/12/2018 10:06 AM   Modules accepted: Orders

## 2018-01-14 NOTE — Telephone Encounter (Signed)
Do you have the completed FMLA paper work?

## 2018-01-16 NOTE — Telephone Encounter (Signed)
I believe I placed this on your desk. Please let me know if you do not have this.

## 2018-01-17 NOTE — Telephone Encounter (Signed)
Paper work found was faxed on 01/14/2018.

## 2018-01-17 NOTE — Telephone Encounter (Signed)
I do not have this. 

## 2018-01-18 NOTE — Telephone Encounter (Signed)
Called and spoke with patient. Pt advised and voiced understanding. Pt stated that she has an appt scheduled with Dr. [name] in December for the follow up.

## 2018-02-02 MED FILL — SERTRALINE HCL 50 MG TABLET: 50 | 30 days supply | Qty: 30 | Fill #2

## 2018-02-14 ENCOUNTER — Ambulatory Visit: Payer: 59 | Admitting: Family Medicine

## 2018-02-14 ENCOUNTER — Encounter: Payer: Self-pay | Admitting: Family Medicine

## 2018-02-14 ENCOUNTER — Encounter

## 2018-02-14 ENCOUNTER — Ambulatory Visit (INDEPENDENT_AMBULATORY_CARE_PROVIDER_SITE_OTHER): Payer: 59

## 2018-02-14 VITALS — BP 110/70 | HR 72 | Temp 98.1°F | Ht 63.0 in | Wt 148.2 lb

## 2018-02-14 DIAGNOSIS — F329 Major depressive disorder, single episode, unspecified: Secondary | ICD-10-CM | POA: Diagnosis not present

## 2018-02-14 DIAGNOSIS — G8929 Other chronic pain: Secondary | ICD-10-CM

## 2018-02-14 DIAGNOSIS — M545 Low back pain: Secondary | ICD-10-CM | POA: Diagnosis not present

## 2018-02-14 DIAGNOSIS — I10 Essential (primary) hypertension: Secondary | ICD-10-CM | POA: Diagnosis not present

## 2018-02-14 DIAGNOSIS — M5442 Lumbago with sciatica, left side: Secondary | ICD-10-CM | POA: Diagnosis not present

## 2018-02-14 DIAGNOSIS — F419 Anxiety disorder, unspecified: Secondary | ICD-10-CM

## 2018-02-14 DIAGNOSIS — F32A Depression, unspecified: Secondary | ICD-10-CM

## 2018-02-14 LAB — COMPREHENSIVE METABOLIC PANEL
ALK PHOS: 71 U/L (ref 39–117)
ALT: 9 U/L (ref 0–35)
AST: 14 U/L (ref 0–37)
Albumin: 4.4 g/dL (ref 3.5–5.2)
BUN: 21 mg/dL (ref 6–23)
CO2: 27 mEq/L (ref 19–32)
Calcium: 9.5 mg/dL (ref 8.4–10.5)
Chloride: 102 mEq/L (ref 96–112)
Creatinine, Ser: 0.43 mg/dL (ref 0.40–1.20)
GFR: 163.36 mL/min (ref >60.00)
GLUCOSE: 84 mg/dL (ref 70–99)
POTASSIUM: 3.5 meq/L (ref 3.5–5.1)
Sodium: 137 mEq/L (ref 135–145)
TOTAL PROTEIN: 6.8 g/dL (ref 6.0–8.3)
Total Bilirubin: 0.4 mg/dL (ref 0.2–1.2)

## 2018-02-14 LAB — LIPID PANEL
CHOLESTEROL: 168 mg/dL (ref 0–200)
HDL: 37.3 mg/dL — AB (ref >39.00)
LDL Cholesterol: 111 mg/dL — ABNORMAL HIGH (ref 0–99)
NONHDL: 130.57
TRIGLYCERIDES: 97 mg/dL (ref 0.0–149.0)
Total CHOL/HDL Ratio: 5
VLDL: 19.4 mg/dL (ref 0.0–40.0)

## 2018-02-14 MED ORDER — TRAMADOL HCL 50 MG PO TABS: 50.0000 mg | ORAL_TABLET | Freq: Four times a day (QID) | ORAL | 0 refills | Status: DC | PRN

## 2018-02-14 MED ORDER — SERTRALINE HCL 100 MG PO TABS: 100.0000 mg | ORAL_TABLET | Freq: Every day | ORAL | 1 refills | Status: DC

## 2018-02-14 NOTE — Progress Notes (Signed)
Tommi Rumps, MD Phone: [phone]  Christina Galvan is a 52 y.o. female who presents today for follow-up.  CC: Depression, low back pain, hypertension  Depression: Patient notes this is not worse than previously.  She does still have some bad days.  She is on Zoloft.  No SI.  She notes her panic attacks are less frequent.  Typically occurs if something wrong happens.  Chronic low back pain: Patient notes it does radiate down the front of her left leg.  Her left foot will become numb at times.  She notes no weakness.  No incontinence.  No saddle anesthesia.  It is been sometime since her last MRI.  She does occasionally take tramadol for discomfort.  She has not seen a specialist yet for this.  Hypertension: No chest pain, shortness of breath, or edema.  She continues on trandolapril.  Social History   Tobacco Use  Smoking Status Current Every Day Smoker  . Packs/day: 0.25  . Years: 30.00  . Pack years: 7.50  . Types: Cigarettes  . Start date: 10/21/1984  Smokeless Tobacco Never Used     ROS see history of present illness  Objective  Physical Exam Vitals:   02/14/18 1400  BP: 110/70  Pulse: 72  Temp: 98.1 F (36.7 C)  SpO2: 98%    BP Readings from Last 3 Encounters:  02/14/18 110/70  12/29/17 134/67  11/23/17 130/80   Wt Readings from Last 3 Encounters:  02/14/18 148 lb 3.2 oz (67.2 kg)  11/23/17 144 lb 8 oz (65.5 kg)  07/26/17 152 lb 3.2 oz (69 kg)    Physical Exam  Constitutional: No distress.  Cardiovascular: Normal rate, regular rhythm and normal heart sounds.  Pulmonary/Chest: Effort normal and breath sounds normal.  Musculoskeletal: She exhibits no edema.  No midline spine tenderness, no midline spine step-off, there is diffuse muscular back tenderness in her upper and lower back  Neurological: She is alert.  5/5 strength bilateral quads, hamstrings, plantar flexion, and dorsiflexion, sensation light touch intact bilateral lower extremities, 2+  patellar reflexes  Skin: Skin is warm and dry. She is not diaphoretic.     Assessment/Plan: Please see individual problem list.  Essential hypertension Well-controlled.  Continue current medication.  Check lab work.  Anxiety and depression Potentially improved with regards to her panic attacks.  We will increase her Zoloft.  I will have the CMA contact the patient to see if she can come back in 3 months for follow-up.  Chronic back pain Chronic issue.  Given radicular symptoms we will obtain an x-ray.  We will refer to orthopedics.  Refill tramadol.  Controlled substance database reviewed.  Given return precautions.   Health Maintenance: CMA will contact the patient to see when and where her last Pap smear was.  Orders Placed This Encounter  Procedures  . DG Lumbar Spine Complete    Standing Status:   Future    Number of Occurrences:   1    Standing Expiration Date:   04/18/2019    Order Specific Question:   Reason for Exam (SYMPTOM  OR DIAGNOSIS REQUIRED)    Answer:   chronic low back pain, new onset radiation to left anterior lower leg    Order Specific Question:   Is patient pregnant?    Answer:   No    Order Specific Question:   Preferred imaging location?    Answer:   [facility]    Order Specific Question:   Radiology Contrast Protocol -  do NOT remove file path    Answer:   [email or web address]  . Comp Met (CMET)  . Lipid panel  . POCT urine pregnancy    Meds ordered this encounter  Medications  . sertraline (ZOLOFT) 100 MG tablet    Sig: Take 1 tablet (100 mg total) by mouth daily.    Dispense:  90 tablet    Refill:  1  . traMADol (ULTRAM) 50 MG tablet    Sig: Take 1 tablet (50 mg total) by mouth every 6 (six) hours as needed for moderate pain.    Dispense:  90 tablet    Refill:  0     Tommi Rumps, MD [place]

## 2018-02-14 NOTE — Patient Instructions (Addendum)
Nice to see you. We will increase your Zoloft. We will get an x-ray. We will check lab work. We will get you to see orthopedics. If your depression worsens please let us know.  Please seek medical attention if you develop thoughts of harming yourself. If you develop weakness, loss of bowel or bladder function, numbness between your legs, or worsening back pain please be evaluated.

## 2018-02-15 LAB — POCT URINE PREGNANCY: Preg Test, Ur: NEGATIVE

## 2018-02-15 MED FILL — traMADol HCL 50 MG TABS: 50 | 23 days supply | Qty: 90 | Fill #0

## 2018-02-15 MED FILL — SERTRALINE HCL 100 MG TAB: 100 | 90 days supply | Qty: 90 | Fill #0

## 2018-02-15 NOTE — Assessment & Plan Note (Signed)
Well-controlled.  Continue current medication.  Check lab work. 

## 2018-02-15 NOTE — Assessment & Plan Note (Addendum)
Potentially improved with regards to her panic attacks.  We will increase her Zoloft.  I will have the CMA contact the patient to see if she can come back in 3 months for follow-up.

## 2018-02-15 NOTE — Assessment & Plan Note (Addendum)
Chronic issue.  Given radicular symptoms we will obtain an x-ray.  We will refer to orthopedics.  Refill tramadol.  Controlled substance database reviewed.  Given return precautions.

## 2018-02-17 MED FILL — OMEPRAZOLE 20 MG CPDR: 20 | 30 days supply | Qty: 30 | Fill #2

## 2018-02-21 ENCOUNTER — Telehealth: Payer: Self-pay | Admitting: *Deleted

## 2018-02-21 NOTE — Telephone Encounter (Signed)
Copied from [place] [id number]. Topic: General - Call Back - No Documentation >> Feb 21, 2018  1:51 PM Reyne Dumas L wrote: Reason for CRM:   Pt states she is returning a call to Dr. Ellen Henri nurse about results.  Pt can be reached at [phone]

## 2018-02-22 ENCOUNTER — Other Ambulatory Visit: Payer: Self-pay | Admitting: Family Medicine

## 2018-02-22 DIAGNOSIS — E785 Hyperlipidemia, unspecified: Secondary | ICD-10-CM

## 2018-02-22 MED ORDER — ROSUVASTATIN CALCIUM 20 MG PO TABS: 20.0000 mg | ORAL_TABLET | Freq: Every day | ORAL | 3 refills | Status: DC

## 2018-02-22 MED FILL — ROSUVASTATIN CALCIUM 20 MG: 20 | 90 days supply | Qty: 90 | Fill #0

## 2018-02-22 NOTE — Telephone Encounter (Signed)
Spoke with patient. Pt advised of her results and voiced understanding.

## 2018-03-03 ENCOUNTER — Other Ambulatory Visit: Payer: Self-pay | Admitting: Family Medicine

## 2018-03-03 MED FILL — TRANDOLAPRIL 4 MG TABLET: 4 | 90 days supply | Qty: 90 | Fill #0

## 2018-03-03 NOTE — Progress Notes (Signed)
Called patient and left a VM to call back to schedule for an appt. Sent to [facility] pool and CRM created.

## 2018-03-16 ENCOUNTER — Ambulatory Visit: Payer: 59 | Admitting: Family Medicine

## 2018-03-28 ENCOUNTER — Other Ambulatory Visit: Payer: Self-pay | Admitting: Family Medicine

## 2018-03-28 NOTE — Telephone Encounter (Signed)
Controlled substance database reviewed. Sent to pharmacy.   

## 2018-03-29 MED FILL — traMADol HCL 50 MG TABS: 50 | 7 days supply | Qty: 30 | Fill #0

## 2018-03-30 ENCOUNTER — Other Ambulatory Visit (INDEPENDENT_AMBULATORY_CARE_PROVIDER_SITE_OTHER): Payer: 59

## 2018-03-30 DIAGNOSIS — E785 Hyperlipidemia, unspecified: Secondary | ICD-10-CM

## 2018-03-30 LAB — HEPATIC FUNCTION PANEL
ALT: 8 U/L (ref 0–35)
AST: 14 U/L (ref 0–37)
Albumin: 4.3 g/dL (ref 3.5–5.2)
Alkaline Phosphatase: 78 U/L (ref 39–117)
Bilirubin, Direct: 0.1 mg/dL (ref 0.0–0.3)
Total Bilirubin: 0.3 mg/dL (ref 0.2–1.2)
Total Protein: 6.7 g/dL (ref 6.0–8.3)

## 2018-03-30 LAB — LDL CHOLESTEROL, DIRECT: Direct LDL: 40 mg/dL

## 2018-04-01 ENCOUNTER — Other Ambulatory Visit: Payer: Self-pay | Admitting: Family Medicine

## 2018-04-01 MED FILL — OMEPRAZOLE 20 MG CPDR: 20 | 30 days supply | Qty: 30 | Fill #0

## 2018-04-22 MED FILL — traMADol HCL 50 MG TABS: 50 | 7 days supply | Qty: 30 | Fill #1

## 2018-05-18 ENCOUNTER — Other Ambulatory Visit: Payer: Self-pay | Admitting: Family Medicine

## 2018-05-18 MED FILL — OMEPRAZOLE 20 MG CPDR: 20 | 30 days supply | Qty: 30 | Fill #1

## 2018-05-18 NOTE — Telephone Encounter (Signed)
Last OV 02/14/2018   Last refilled 03/28/2018 disp 30 with 1 refill   Sent to PCP for approval

## 2018-05-20 MED FILL — traMADol HCL 50 MG TABS: 50 | 7 days supply | Qty: 30 | Fill #0

## 2018-06-02 MED FILL — TRANDOLAPRIL 4 MG TABLET: 4 | 90 days supply | Qty: 90 | Fill #1

## 2018-06-02 MED FILL — ROSUVASTATIN CALCIUM 20 MG: 20 | 90 days supply | Qty: 90 | Fill #1

## 2018-06-02 MED FILL — SERTRALINE HCL 100 MG TAB: 100 | 90 days supply | Qty: 90 | Fill #1

## 2018-06-02 MED FILL — traMADol HCL 50 MG TABS: 50 | 7 days supply | Qty: 30 | Fill #1

## 2018-06-08 ENCOUNTER — Encounter: Payer: Self-pay | Admitting: Family Medicine

## 2018-06-08 ENCOUNTER — Telehealth: Payer: Self-pay | Admitting: Family Medicine

## 2018-06-08 ENCOUNTER — Ambulatory Visit (INDEPENDENT_AMBULATORY_CARE_PROVIDER_SITE_OTHER): Payer: 59 | Admitting: Family Medicine

## 2018-06-08 ENCOUNTER — Other Ambulatory Visit: Payer: Self-pay

## 2018-06-08 DIAGNOSIS — E782 Mixed hyperlipidemia: Secondary | ICD-10-CM | POA: Diagnosis not present

## 2018-06-08 DIAGNOSIS — I10 Essential (primary) hypertension: Secondary | ICD-10-CM | POA: Diagnosis not present

## 2018-06-08 DIAGNOSIS — F329 Major depressive disorder, single episode, unspecified: Secondary | ICD-10-CM | POA: Diagnosis not present

## 2018-06-08 DIAGNOSIS — F32A Depression, unspecified: Secondary | ICD-10-CM

## 2018-06-08 DIAGNOSIS — F419 Anxiety disorder, unspecified: Secondary | ICD-10-CM | POA: Diagnosis not present

## 2018-06-08 DIAGNOSIS — R42 Dizziness and giddiness: Secondary | ICD-10-CM | POA: Diagnosis not present

## 2018-06-08 MED ORDER — SERTRALINE HCL 100 MG PO TABS: 150.0000 mg | ORAL_TABLET | Freq: Every day | ORAL | 1 refills | Status: DC

## 2018-06-08 NOTE — Progress Notes (Signed)
Virtual Visit via Telephone Note  This visit type was conducted due to national recommendations for restrictions regarding the COVID-19 pandemic (e.g. social distancing).  This format is felt to be most appropriate for this patient at this time.  All issues noted in this document were discussed and addressed.  No physical exam was performed (except for noted visual exam findings with Video Visits).    I connected with Iver Nestle on 06/08/18 at  1:15 PM EDT by telephone and verified that I am speaking with the correct person using two identifiers.   I discussed the limitations, risks, security and privacy concerns of performing an evaluation and management service by telephone and the availability of in person appointments. I also discussed with the patient that there may be a patient responsible charge related to this service. The patient expressed understanding and agreed to proceed.  The patient Quetzal Meany) was at home and I Tommi Rumps) was in the office. We were the participants in this visit.   History of Present Illness: HYPERTENSION  Disease Monitoring  Home BP Monitoring notes it has been fine Chest pain- no    Dyspnea- no, other than with panic attacks Medications  Compliance-  Taking mavik. Lightheadedness-  no  Edema- no  HYPERLIPIDEMIA Symptoms Chest pain on exertion:  no   Medications: Compliance- taking crestor Right upper quadrant pain- no  Muscle aches- no  Anxiety/depression: Patient notes her anxiety has been up with the current issues in the world.  She notes she had a panic attack yesterday and had been doing great with those prior to yesterday.  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  She notes it felt like she could not breathe and her chest was heavy.  She felt very anxious.  This lasted for about 10 minutes and then took about 20 minutes to calm down.  She was evaluated  by her nurse supervisor.  She notes no depression.  No SI.  Vertigo: She notes for the last couple of months there have been several times where the room will start to spin.  This only occurs when she is walking and then turns quickly.  Lasts less than 5 minutes and resolves on its own.  She notes no tinnitus or ear fullness.  No headaches or vision changes.  No congestion, fever, or cough     Observations/Objective: This was a telehealth visit and a physical exam was not completed.  Assessment and Plan: Essential hypertension Patient reports good control though is unable to report the numbers.  She will continue with her current medication.  Plan for follow-up in 3 months.  Anxiety and depression Anxiety seems to have worsened with panic attacks.  Her description of what happened yesterday is consistent with her prior panic attacks.  Discussed we will increase her Zoloft.  Discussed that the other medications that could be used for this could potentially make her drowsy and would not be good options given what she does for work.  If she has persistent issues with this she will let us know.  Vertigo Based on description this seems to be peripheral.  Discussed that it is difficult to accurately diagnose her without an exam given that this is a phone visit.  Possibly BPPV.  We will have her complete Epley maneuver at home.  If not improving over the next week she will let us know.  If she  develops persistent vertigo symptoms she will seek medical attention.  Hyperlipidemia Continue Crestor.  Discussed coronavirus social distancing precautions.  Follow Up Instructions: Patient to follow-up in the office in 3 months.  CMA will contact them to arrange this.   I discussed the assessment and treatment plan with the patient. The patient was provided an opportunity to ask questions and all were answered. The patient agreed with the plan and demonstrated an understanding of the instructions.   The  patient was advised to call back or seek an in-person evaluation if the symptoms worsen or if the condition fails to improve as anticipated.  I provided 21 minutes of non-face-to-face time during this encounter.   Tommi Rumps, MD

## 2018-06-08 NOTE — Assessment & Plan Note (Signed)
Patient reports good control though is unable to report the numbers.  She will continue with her current medication.  Plan for follow-up in 3 months.

## 2018-06-08 NOTE — Telephone Encounter (Signed)
Please get the patient scheduled for 40-month follow-up.  Thanks.

## 2018-06-08 NOTE — Assessment & Plan Note (Signed)
Anxiety seems to have worsened with panic attacks.  Her description of what happened yesterday is consistent with her prior panic attacks.  Discussed we will increase her Zoloft.  Discussed that the other medications that could be used for this could potentially make her drowsy and would not be good options given what she does for work.  If she has persistent issues with this she will let us know.

## 2018-06-08 NOTE — Assessment & Plan Note (Signed)
Based on description this seems to be peripheral.  Discussed that it is difficult to accurately diagnose her without an exam given that this is a phone visit.  Possibly BPPV.  We will have her complete Epley maneuver at home.  If not improving over the next week she will let us know.  If she develops persistent vertigo symptoms she will seek medical attention.

## 2018-06-08 NOTE — Telephone Encounter (Signed)
Pt has already been scheduled for the 3 month f/u appt in June

## 2018-06-08 NOTE — Assessment & Plan Note (Signed)
Continue Crestor 

## 2018-06-30 ENCOUNTER — Other Ambulatory Visit: Payer: Self-pay | Admitting: Family Medicine

## 2018-07-01 NOTE — Telephone Encounter (Signed)
Last OV 06/08/2018  Last refilled 05/20/2018 disp 30 with 1 refill   Next OV 08/16/2018  Sent to PCP for approval

## 2018-07-02 MED FILL — traMADol HCL 50 MG TABS: 50 | 8 days supply | Qty: 30 | Fill #0

## 2018-07-06 MED FILL — OMEPRAZOLE 20 MG CPDR: 20 | 30 days supply | Qty: 30 | Fill #2

## 2018-07-28 MED FILL — traMADol HCL 50 MG TABS: 50 | 8 days supply | Qty: 30 | Fill #1

## 2018-08-16 ENCOUNTER — Other Ambulatory Visit: Payer: Self-pay

## 2018-08-16 ENCOUNTER — Ambulatory Visit (INDEPENDENT_AMBULATORY_CARE_PROVIDER_SITE_OTHER): Payer: 59 | Admitting: Family Medicine

## 2018-08-16 ENCOUNTER — Encounter: Payer: Self-pay | Admitting: Family Medicine

## 2018-08-16 DIAGNOSIS — F419 Anxiety disorder, unspecified: Secondary | ICD-10-CM

## 2018-08-16 DIAGNOSIS — M545 Low back pain, unspecified: Secondary | ICD-10-CM

## 2018-08-16 DIAGNOSIS — F329 Major depressive disorder, single episode, unspecified: Secondary | ICD-10-CM | POA: Diagnosis not present

## 2018-08-16 DIAGNOSIS — K219 Gastro-esophageal reflux disease without esophagitis: Secondary | ICD-10-CM | POA: Diagnosis not present

## 2018-08-16 DIAGNOSIS — F32A Depression, unspecified: Secondary | ICD-10-CM

## 2018-08-16 DIAGNOSIS — G8929 Other chronic pain: Secondary | ICD-10-CM | POA: Diagnosis not present

## 2018-08-16 MED ORDER — TRAMADOL HCL 50 MG PO TABS: ORAL_TABLET | ORAL | 1 refills | Status: DC

## 2018-08-16 MED ORDER — SERTRALINE HCL 100 MG PO TABS: 200.0000 mg | ORAL_TABLET | Freq: Every day | ORAL | 1 refills | Status: DC

## 2018-08-16 MED FILL — traMADol HCL 50 MG TABS: 50 | 7 days supply | Qty: 30 | Fill #0

## 2018-08-16 MED FILL — SERTRALINE HCL 100 MG TAB: 100 | 90 days supply | Qty: 180 | Fill #0

## 2018-08-16 NOTE — Assessment & Plan Note (Signed)
Chronic issue.  She is having some radicular symptoms.  We will order an MRI.  She will continue tramadol.

## 2018-08-16 NOTE — Assessment & Plan Note (Signed)
Stable on omeprazole.  Given duration of use of this medication and symptoms with discontinuation we will have her see GI to consider the need for EGD.

## 2018-08-16 NOTE — Progress Notes (Signed)
Virtual Visit via video and telephone Note  This visit type was conducted due to national recommendations for restrictions regarding the COVID-19 pandemic (e.g. social distancing).  This format is felt to be most appropriate for this patient at this time.  All issues noted in this document were discussed and addressed.  No physical exam was performed (except for noted visual exam findings with Video Visits).   I connected with Christina Galvan today at 11:00 AM EDT by a video enabled telemedicine application and telephone and verified that I am speaking with the correct person using two identifiers. Location patient: car Location provider: work Persons participating in the virtual visit: patient, provider  I discussed the limitations, risks, security and privacy concerns of performing an evaluation and management service by telephone and the availability of in person appointments. I also discussed with the patient that there may be a patient responsible charge related to this service. The patient expressed understanding and agreed to proceed.  Interactive audio and video telecommunications were attempted between this provider and patient, however failed, due to patient having technical difficulties OR patient did not have access to video capability.  5 minutes of the video visit was completed and then there were connectivity issues.  We continued and completed visit with audio only.    Reason for visit: Follow-up.  HPI: Anxiety/depression: No depression.  She does note some anxiety with some intermittent panic attacks that occur every other day.  She notes having her cat around does help.  She started yoga as well which has been somewhat helpful.  She does feel that the Zoloft has been beneficial.  Chronic back pain: She is taking tramadol 2-3 times daily.  Notes some days it does not help as much as others.  She does note occasional numbness in the top of her bilateral feet left greater than  right.  She does have radiation of pain from her back down the back of her left leg.  This has been going on for 6 months.  No weakness.  No incontinence.  No saddle anesthesia.  No recent MRI.  GERD: Taking omeprazole.  She has no symptoms if she remains on medication.  No abdominal pain, blood in her stool, or dysphagia.  She is never had an EGD.   ROS: See pertinent positives and negatives per HPI.  Past Medical History:  Diagnosis Date  . Anxiety   . Anxiety and depression   . Depression with anxiety   . GERD (gastroesophageal reflux disease)   . History of frequent urinary tract infections   . Hypertension   . Kidney stones   . Migraines     Past Surgical History:  Procedure Laterality Date  . carpal tunnel    . CESAREAN SECTION  1993  . CHOLECYSTECTOMY  1994  . COLONOSCOPY WITH PROPOFOL N/A 02/12/2017   Procedure: COLONOSCOPY WITH PROPOFOL;  Surgeon: Jonathon Bellows, MD;  Location: [facility] ENDOSCOPY;  Service: Gastroenterology;  Laterality: N/A;    Family History  Problem Relation Age of Onset  . Arthritis Mother   . Heart disease Mother   . Hypertension Mother   . Diabetes Mother   . Arrhythmia Mother        A-Fib   . Cancer Father        Lung Cancer  . Diabetes Brother   . Asthma Daughter   . Cancer Paternal Uncle        Prostate cancer  . Heart disease Maternal Grandfather   . Heart  disease Paternal Grandfather     SOCIAL HX: Smoker.   Current Outpatient Medications:  .  omeprazole (PRILOSEC) 20 MG capsule, TAKE 1 CAPSULE BY MOUTH ONCE DAILY, Disp: 30 capsule, Rfl: 2 .  rosuvastatin (CRESTOR) 20 MG tablet, Take 1 tablet (20 mg total) by mouth daily., Disp: 90 tablet, Rfl: 3 .  sertraline (ZOLOFT) 100 MG tablet, Take 2 tablets (200 mg total) by mouth daily., Disp: 180 tablet, Rfl: 1 .  traMADol (ULTRAM) 50 MG tablet, TAKE 1 TABLET BY MOUTH EVERY 6 HOURS AS NEEDED FOR MODERATE PAIN., Disp: 30 tablet, Rfl: 1 .  trandolapril (MAVIK) 4 MG tablet, TAKE 1 TABLET BY  MOUTH ONCE DAILY, Disp: 90 tablet, Rfl: 1 .  cyclobenzaprine (FLEXERIL) 10 MG tablet, Take 1 tablet (10 mg total) by mouth 3 (three) times daily as needed for up to 20 doses for muscle spasms. (Patient not taking: Reported on 08/16/2018), Disp: 20 tablet, Rfl: 0  EXAM:  VITALS per patient if applicable: None.  GENERAL: alert, oriented, appears well and in no acute distress  HEENT: atraumatic, conjunttiva clear, no obvious abnormalities on inspection of external nose and ears  NECK: normal movements of the head and neck  LUNGS: on inspection no signs of respiratory distress, breathing rate appears normal, no obvious gross SOB, gasping or wheezing  CV: no obvious cyanosis  MS: moves all visible extremities without noticeable abnormality  PSYCH/NEURO: pleasant and cooperative, no obvious depression or anxiety, speech and thought processing grossly intact  ASSESSMENT AND PLAN:  Discussed the following assessment and plan:  Gastroesophageal reflux disease without esophagitis - Plan: Ambulatory referral to Gastroenterology  Anxiety and depression  Chronic low back pain, unspecified back pain laterality, unspecified whether sciatica present - Plan: MR Lumbar Spine Wo Contrast  GERD (gastroesophageal reflux disease) Stable on omeprazole.  Given duration of use of this medication and symptoms with discontinuation we will have her see GI to consider the need for EGD.  Anxiety and depression No depression.  Continues to have some panic attacks.  We will increase Zoloft.  Follow-up in 3 months.  Chronic back pain Chronic issue.  She is having some radicular symptoms.  We will order an MRI.  She will continue tramadol.  [place] office staff will contact the patient to get her scheduled for follow-up in 3 months.  Social distancing precautions and sick precautions given regarding COVID-19.   I discussed the assessment and treatment plan with the patient. The patient was provided an  opportunity to ask questions and all were answered. The patient agreed with the plan and demonstrated an understanding of the instructions.   The patient was advised to call back or seek an in-person evaluation if the symptoms worsen or if the condition fails to improve as anticipated.  I provided 22 minutes of non-face-to-face time during this encounter.   Tommi Rumps, MD

## 2018-08-16 NOTE — Assessment & Plan Note (Signed)
No depression.  Continues to have some panic attacks.  We will increase Zoloft.  Follow-up in 3 months.

## 2018-08-17 ENCOUNTER — Encounter: Payer: Self-pay | Admitting: *Deleted

## 2018-08-24 ENCOUNTER — Other Ambulatory Visit: Payer: Self-pay | Admitting: Family Medicine

## 2018-08-30 ENCOUNTER — Ambulatory Visit
Admission: RE | Admit: 2018-08-30 | Discharge: 2018-08-30 | Disposition: A | Discharge status: Home / Self Care | Payer: 59 | Source: Ambulatory Visit | Attending: Family Medicine | Admitting: Family Medicine

## 2018-08-30 ENCOUNTER — Other Ambulatory Visit: Payer: Self-pay

## 2018-08-30 DIAGNOSIS — M545 Low back pain, unspecified: Secondary | ICD-10-CM

## 2018-08-30 DIAGNOSIS — G8929 Other chronic pain: Secondary | ICD-10-CM | POA: Insufficient documentation

## 2018-09-08 ENCOUNTER — Telehealth: Payer: Self-pay | Admitting: Family Medicine

## 2018-09-08 DIAGNOSIS — I10 Essential (primary) hypertension: Secondary | ICD-10-CM

## 2018-09-08 MED FILL — TRANDOLAPRIL 4 MG TABLET: 4 | 90 days supply | Qty: 90 | Fill #0

## 2018-09-08 MED FILL — ROSUVASTATIN CALCIUM 20 MG: 20 | 90 days supply | Qty: 90 | Fill #2

## 2018-09-08 MED FILL — OMEPRAZOLE 20 MG CPDR: 20 | 30 days supply | Qty: 30 | Fill #0

## 2018-09-08 MED FILL — traMADol HCL 50 MG TABS: 50 | 7 days supply | Qty: 30 | Fill #1

## 2018-09-09 NOTE — Addendum Note (Signed)
Addended by: Leone Haven on: 09/09/2018 12:35 PM   Modules accepted: Orders

## 2018-09-09 NOTE — Telephone Encounter (Signed)
Patient needs lab work related to being on this medication for her blood pressure.  Orders placed.  Please get her scheduled.  Thanks.

## 2018-09-12 ENCOUNTER — Other Ambulatory Visit: Payer: Self-pay

## 2018-09-12 ENCOUNTER — Ambulatory Visit: Payer: 59 | Admitting: Gastroenterology

## 2018-09-12 ENCOUNTER — Encounter: Payer: Self-pay | Admitting: Gastroenterology

## 2018-09-12 VITALS — BP 110/74 | HR 73 | Temp 98.2°F | Ht 63.0 in | Wt 153.6 lb

## 2018-09-12 DIAGNOSIS — K219 Gastro-esophageal reflux disease without esophagitis: Secondary | ICD-10-CM | POA: Diagnosis not present

## 2018-09-12 DIAGNOSIS — R140 Abdominal distension (gaseous): Secondary | ICD-10-CM

## 2018-09-12 MED ORDER — FAMOTIDINE 40 MG PO TABS: 40.0000 mg | ORAL_TABLET | Freq: Every day | ORAL | 0 refills | Status: DC

## 2018-09-12 MED FILL — SM ACID REDUCER 20 MG TAB: 20 | 25 days supply | Qty: 50 | Fill #0

## 2018-09-12 NOTE — Progress Notes (Signed)
Jonathon Bellows MD, MRCP(U.K) [address]  [place]  [place], [place] [address]  Main: [phone]  Fax: [phone]   Gastroenterology Consultation  Referring Provider:     Leone Haven, MD Primary Care Physician:  Leone Haven, MD Primary Gastroenterologist:  Dr. Jonathon Bellows  Reason for Consultation:     GERD        HPI:   Christina Galvan is a 53 y.o. y/o female referred for consultation & management  by Dr. [name], Angela Adam, MD.     Reflux:  Onset : 20 to 30 years back Symptoms: Heartburn Recent weight gain: Yes Medications: Omeprazole 20 mg first thing in the morning on an empty stomach Narcotics or anticholinergics use : None PPI /H2 blockers or Antacid  use and timing : As above Prior EGD: None symptoms are well controlled on PPI  Also complains of some postprandial bloating abdominal distention which is relieved with passage of gas.  He is concerned about the side effects of PPI and long-term use.    Past Medical History:  Diagnosis Date  . Anxiety   . Anxiety and depression   . Depression with anxiety   . GERD (gastroesophageal reflux disease)   . History of frequent urinary tract infections   . Hypertension   . Kidney stones   . Migraines     Past Surgical History:  Procedure Laterality Date  . carpal tunnel    . CESAREAN SECTION  1993  . CHOLECYSTECTOMY  1994  . COLONOSCOPY WITH PROPOFOL N/A 02/12/2017   Procedure: COLONOSCOPY WITH PROPOFOL;  Surgeon: Jonathon Bellows, MD;  Location: [facility] ENDOSCOPY;  Service: Gastroenterology;  Laterality: N/A;    Prior to Admission medications   Medication Sig Start Date End Date Taking? Authorizing Provider  cyclobenzaprine (FLEXERIL) 10 MG tablet Take 1 tablet (10 mg total) by mouth 3 (three) times daily as needed for up to 20 doses for muscle spasms. Patient not taking: Reported on 08/16/2018 12/29/17   Lennice Sites, DO  omeprazole (PRILOSEC) 20 MG capsule TAKE 1 CAPSULE BY MOUTH ONCE A DAY  08/25/18   Leone Haven, MD  rosuvastatin (CRESTOR) 20 MG tablet Take 1 tablet (20 mg total) by mouth daily. 02/22/18   Leone Haven, MD  sertraline (ZOLOFT) 100 MG tablet Take 2 tablets (200 mg total) by mouth daily. 08/16/18   Leone Haven, MD  traMADol (ULTRAM) 50 MG tablet TAKE 1 TABLET BY MOUTH EVERY 6 HOURS AS NEEDED FOR MODERATE PAIN. 08/16/18   Leone Haven, MD  trandolapril (Twain) 4 MG tablet TAKE 1 TABLET BY MOUTH ONCE A DAY 09/08/18   Leone Haven, MD    Family History  Problem Relation Age of Onset  . Arthritis Mother   . Heart disease Mother   . Hypertension Mother   . Diabetes Mother   . Arrhythmia Mother        A-Fib   . Cancer Father        Lung Cancer  . Diabetes Brother   . Asthma Daughter   . Cancer Paternal Uncle        Prostate cancer  . Heart disease Maternal Grandfather   . Heart disease Paternal Grandfather      Social History   Tobacco Use  . Smoking status: Current Every Day Smoker    Packs/day: 0.25    Years: 30.00    Pack years: 7.50    Types: Cigarettes    Start date: 10/21/1984  .  Smokeless tobacco: Never Used  Substance Use Topics  . Alcohol use: Yes    Alcohol/week: 0.0 standard drinks    Comment: Rare  . Drug use: No    Allergies as of 09/12/2018 - Review Complete 08/16/2018  Allergen Reaction Noted  . Thiazide-type diuretics  12/09/2015  . Mushroom extract complex  06/24/2015  . Sulfa antibiotics  06/24/2015    Review of Systems:    All systems reviewed and negative except where noted in HPI.   Physical Exam:  There were no vitals taken for this visit. No LMP recorded. (Menstrual status: IUD). Psych:  Alert and cooperative. Normal mood and affect. General:   Alert,  Well-developed, well-nourished, pleasant and cooperative in NAD Head:  Normocephalic and atraumatic. Eyes:  Sclera clear, no icterus.   Conjunctiva pink. Ears:  Normal auditory acuity. Nose:  No deformity, discharge, or lesions. Mouth:   No deformity or lesions,oropharynx pink & moist. Neck:  Supple; no masses or thyromegaly. Lungs:  Respirations even and unlabored.  Clear throughout to auscultation.   No wheezes, crackles, or rhonchi. No acute distress. Heart:  Regular rate and rhythm; no murmurs, clicks, rubs, or gallops. Abdomen:  Normal bowel sounds.  No bruits.  Soft, non-tender and non-distended without masses, hepatosplenomegaly or hernias noted.  No guarding or rebound tenderness.    Neurologic:  Alert and oriented x3;  grossly normal neurologically. Skin:  Intact without significant lesions or rashes. No jaundice. Lymph Nodes:  No significant cervical adenopathy. Psych:  Alert and cooperative. Normal mood and affect.  Imaging Studies: Mr Lumbar Spine Wo Contrast  Result Date: 08/30/2018 CLINICAL DATA:  Initial evaluation for chronic low back pain with pain in left buttock and in left lower extremity with associated numbness. EXAM: MRI LUMBAR SPINE WITHOUT CONTRAST TECHNIQUE: Multiplanar, multisequence MR imaging of the lumbar spine was performed. No intravenous contrast was administered. COMPARISON:  Prior radiographs from 02/14/2018 as well as previous MRI from 05/31/2010. FINDINGS: Segmentation: Standard. Lowest well-formed disc labeled the L5-S1 level. Alignment: Physiologic with preservation of the normal lumbar lordosis. No listhesis or subluxation. Vertebrae: Vertebral body height maintained without evidence for acute or chronic fracture. Bone marrow signal intensity within normal limits. Few scattered benign hemangiomas noted, most prominent of which measures 2.3 cm in the L1 vertebral body. No other discrete or worrisome osseous lesions. No abnormal marrow edema. Conus medullaris and cauda equina: Conus extends to the L1 level. Conus and cauda equina appear normal. Paraspinal and other soft tissues: Paraspinous soft tissues within normal limits. Visualized visceral structures unremarkable. Disc levels: L1-2:   Unremarkable. L2-3:  Unremarkable. L3-4: Negative interspace. Mild bilateral facet hypertrophy. No canal or foraminal stenosis. L4-5: Mild diffuse disc bulge with disc desiccation. Superimposed broad-based central disc protrusion mildly indents the ventral thecal sac. Mild bilateral facet hypertrophy. Resultant mild to moderate bilateral lateral recess narrowing (descending L5 nerve root level). Central canal remains patent. No significant foraminal encroachment. L5-S1: Mild diffuse disc bulge with disc desiccation. Superimposed broad-based central disc protrusion closely approximates the descending S1 nerve roots without neural impingement or displacement. Central canal and lateral recesses remain patent. No significant foraminal encroachment. IMPRESSION: 1. Broad-based central disc protrusion and facet hypertrophy at L4-5 with resultant mild to moderate bilateral lateral recess stenosis, potentially affecting either of the descending L5 nerve roots. 2. Broad-based central disc protrusion at L5-S1, closely approximating the descending S1 nerve roots bilaterally. No frank neural impingement. Electronically Signed   By: Jeannine Boga M.D.   On: 08/30/2018 15:51  Assessment and Plan:   ALBIRTHA GRINAGE is a 52 y.o. y/o female has been referred for GERD.   1. GERD : Counseled on life style changes, suggest to use PPI first thing in the morning on empty stomach and eat 30 minutes after. Advised avoid meals for 2 hours prior to bed time. .Discussed the risks and benefits of long term PPI use including but not limited to bone loss, chronic kidney disease, infections , low magnesium . Aim to use at the lowest dose for the shortest period of time.  Since doing well on low-dose PPI I suggested we change it to famotidine 40 mg once a day.  If she is doing well on this dose will reduce to 20 mg of famotidine in the next visit.  Due to long-term GERD of over 15 years.  Gave her the option to screen for  Barrett's esophagus which she is keen on.  2.  Bloating: Trial of low FODMAP diet.  Avoid all artificial sweeteners and sugars. I have discussed alternative options, risks & benefits,  which include, but are not limited to, bleeding, infection, perforation,respiratory complication & drug reaction.  The patient agrees with this plan & written consent will be obtained.       Follow up in 3 months   Dr Jonathon Bellows MD,MRCP(U.K)

## 2018-09-12 NOTE — Addendum Note (Signed)
Addended by: Dorethea Clan on: 09/12/2018 03:31 PM   Modules accepted: Orders

## 2018-09-12 NOTE — Telephone Encounter (Signed)
Called and left a voicemail informing pt to call back and schedule labs to be drawn because of the new mediation she is now on.  Angel Weedon,cma

## 2018-09-12 NOTE — Patient Instructions (Signed)

## 2018-10-04 ENCOUNTER — Other Ambulatory Visit: Payer: Self-pay | Admitting: Family Medicine

## 2018-10-05 NOTE — Telephone Encounter (Signed)
Refilled: 08/16/2018 Last OV: 08/16/2018 Next OV: not scheduled

## 2018-10-06 MED FILL — traMADol HCL 50 MG TABS: 50 | 7 days supply | Qty: 30 | Fill #0

## 2018-10-27 MED FILL — FAMOTIDINE 20 MG TABLET: 20 | 30 days supply | Qty: 60 | Fill #0

## 2018-10-27 MED FILL — traMADol HCL 50 MG TABS: 50 | 7 days supply | Qty: 30 | Fill #1

## 2018-11-22 DIAGNOSIS — H52223 Regular astigmatism, bilateral: Secondary | ICD-10-CM | POA: Diagnosis not present

## 2018-11-22 DIAGNOSIS — H524 Presbyopia: Secondary | ICD-10-CM | POA: Diagnosis not present

## 2018-11-22 DIAGNOSIS — H5213 Myopia, bilateral: Secondary | ICD-10-CM | POA: Diagnosis not present

## 2018-11-22 DIAGNOSIS — H5212 Myopia, left eye: Secondary | ICD-10-CM | POA: Diagnosis not present

## 2018-12-01 MED FILL — FAMOTIDINE 20 MG TABS: 20 | 30 days supply | Qty: 60 | Fill #1

## 2018-12-13 ENCOUNTER — Ambulatory Visit: Payer: 59 | Admitting: Gastroenterology

## 2018-12-23 ENCOUNTER — Other Ambulatory Visit: Payer: Self-pay

## 2018-12-23 ENCOUNTER — Other Ambulatory Visit (INDEPENDENT_AMBULATORY_CARE_PROVIDER_SITE_OTHER): Payer: 59

## 2018-12-23 DIAGNOSIS — I10 Essential (primary) hypertension: Secondary | ICD-10-CM

## 2018-12-23 LAB — BASIC METABOLIC PANEL
BUN: 16 mg/dL (ref 6–23)
CO2: 29 mEq/L (ref 19–32)
Calcium: 9.3 mg/dL (ref 8.4–10.5)
Chloride: 104 mEq/L (ref 96–112)
Creatinine, Ser: 0.6 mg/dL (ref 0.40–1.20)
GFR: 104.3 mL/min (ref >60.00)
Glucose, Bld: 92 mg/dL (ref 70–99)
Potassium: 3.4 mEq/L — ABNORMAL LOW (ref 3.5–5.1)
Sodium: 140 mEq/L (ref 135–145)

## 2018-12-30 MED FILL — ROSUVASTATIN CALCIUM 20 MG: 20 | 90 days supply | Qty: 90 | Fill #3

## 2018-12-30 MED FILL — SERTRALINE HCL 100 MG TAB: 100 | 90 days supply | Qty: 180 | Fill #1

## 2018-12-30 MED FILL — TRANDOLAPRIL 4 MG TABLET: 4 | 90 days supply | Qty: 90 | Fill #1

## 2019-01-06 MED FILL — FAMOTIDINE 20 MG TABS: 20 | 30 days supply | Qty: 60 | Fill #2

## 2019-01-08 ENCOUNTER — Encounter: Payer: Self-pay | Admitting: Family Medicine

## 2019-01-09 NOTE — Telephone Encounter (Signed)
I attempted to call the patient regarding this message. There was no answer. I will have Gae Bon call her tomorrow.

## 2019-01-10 NOTE — Telephone Encounter (Signed)
She needs to have an evaluation for this. She can be scheduled for a virtual visit for this given her headache. She can see me or Lauren.

## 2019-01-10 NOTE — Telephone Encounter (Signed)
I called and spoke with the patient and scheduled the patient to see the provider.  Nina,cma

## 2019-01-13 ENCOUNTER — Ambulatory Visit: Payer: 59 | Admitting: Family Medicine

## 2019-01-13 ENCOUNTER — Other Ambulatory Visit: Payer: Self-pay

## 2019-01-13 ENCOUNTER — Encounter: Payer: Self-pay | Admitting: Family Medicine

## 2019-01-13 VITALS — BP 140/80 | HR 75 | Temp 97.0°F | Ht 63.0 in | Wt 147.6 lb

## 2019-01-13 DIAGNOSIS — F0390 Unspecified dementia without behavioral disturbance: Secondary | ICD-10-CM | POA: Insufficient documentation

## 2019-01-13 DIAGNOSIS — R410 Disorientation, unspecified: Secondary | ICD-10-CM | POA: Diagnosis not present

## 2019-01-13 DIAGNOSIS — R413 Other amnesia: Secondary | ICD-10-CM | POA: Insufficient documentation

## 2019-01-13 DIAGNOSIS — G44229 Chronic tension-type headache, not intractable: Secondary | ICD-10-CM | POA: Diagnosis not present

## 2019-01-13 DIAGNOSIS — R519 Headache, unspecified: Secondary | ICD-10-CM | POA: Insufficient documentation

## 2019-01-13 LAB — HEMOGLOBIN A1C: Hgb A1c MFr Bld: 5.6 % (ref 4.6–6.5)

## 2019-01-13 LAB — COMPREHENSIVE METABOLIC PANEL
ALT: 10 U/L (ref 0–35)
AST: 14 U/L (ref 0–37)
Albumin: 4.4 g/dL (ref 3.5–5.2)
Alkaline Phosphatase: 70 U/L (ref 39–117)
BUN: 17 mg/dL (ref 6–23)
CO2: 27 mEq/L (ref 19–32)
Calcium: 9.4 mg/dL (ref 8.4–10.5)
Chloride: 105 mEq/L (ref 96–112)
Creatinine, Ser: 0.52 mg/dL (ref 0.40–1.20)
GFR: 123.01 mL/min (ref >60.00)
Glucose, Bld: 92 mg/dL (ref 70–99)
Potassium: 3.7 mEq/L (ref 3.5–5.1)
Sodium: 139 mEq/L (ref 135–145)
Total Bilirubin: 0.4 mg/dL (ref 0.2–1.2)
Total Protein: 7 g/dL (ref 6.0–8.3)

## 2019-01-13 LAB — CBC WITH DIFFERENTIAL/PLATELET
Basophils Absolute: 0 10*3/uL (ref 0.0–0.1)
Basophils Relative: 0.4 % (ref 0.0–3.0)
Eosinophils Absolute: 0.1 10*3/uL (ref 0.0–0.7)
Eosinophils Relative: 1.1 % (ref 0.0–5.0)
HCT: 40.8 % (ref 36.0–46.0)
Hemoglobin: 13.8 g/dL (ref 12.0–15.0)
Lymphocytes Relative: 37 % (ref 12.0–46.0)
Lymphs Abs: 2.2 10*3/uL (ref 0.7–4.0)
MCHC: 33.8 g/dL (ref 30.0–36.0)
MCV: 95.1 fl (ref 78.0–100.0)
Monocytes Absolute: 0.3 10*3/uL (ref 0.1–1.0)
Monocytes Relative: 4.4 % (ref 3.0–12.0)
Neutro Abs: 3.4 10*3/uL (ref 1.4–7.7)
Neutrophils Relative %: 57.1 % (ref 43.0–77.0)
Platelets: 183 10*3/uL (ref 150.0–400.0)
RBC: 4.29 Mil/uL (ref 3.87–5.11)
RDW: 13.1 % (ref 11.5–15.5)
WBC: 6 10*3/uL (ref 4.0–10.5)

## 2019-01-13 LAB — VITAMIN B12: Vitamin B-12: 170 pg/mL — ABNORMAL LOW (ref 211–911)

## 2019-01-13 LAB — TSH: TSH: 0.63 u[IU]/mL (ref 0.35–4.50)

## 2019-01-13 NOTE — Assessment & Plan Note (Addendum)
Increased frequency of headaches.  Discussed imaging for this though she is concerned about cost.  Will obtain labs first and then consider imaging.  Obtain lab work as outlined below.  This could be related to her confusion issues.

## 2019-01-13 NOTE — Progress Notes (Signed)
Tommi Rumps, MD Phone: [phone]  Christina Galvan is a 53 y.o. female who presents today for same-day visit.  Confusion: Patient notes this has been going on for a number of months now.  She notes some days are good and she has no issues though other days are bad.  She has had issues at work where she will walk in and then turn around and things will look unrecognizable to her.  More recently she has had issues with feeling confused and forgetting things daily.  She will forget things at home.  She has had trouble remembering names to a certain degree.  She has had issues with math.  She occasionally feels a little sweaty.  She has had no seizure activity.  She notes mild tremor occasionally in her bilateral hands.  No head injury.  No syncope.  No fevers.  No vomiting or diarrhea.  No numbness or weakness.  No vision changes.  No new supplements or medications.  She denies drug use and alcohol use.  No family history of early onset dementia.  She does note she feels depressed at times though notes it is quite a bit better than it was. No SI.  She continues on Zoloft.  She stopped tramadol a couple of months ago though noticed no difference with this.  MMSE - Mini Mental State Exam 01/13/2019  Orientation to time 4  Orientation to Place 5  Registration 3  Attention/ Calculation 2  Recall 1  Language- name 2 objects 2  Language- repeat 1  Language- follow 3 step command 3  Language- read & follow direction 1  Write a sentence 1  Copy design 0  Total score 23     Headaches: Patient notes over the last couple months she has been having frontal headache that occurs every other day.  Typically improves with laying down.  She has some phonophobia with it though no photophobia.  Social History   Tobacco Use  Smoking Status Current Every Day Smoker  . Packs/day: 0.25  . Years: 30.00  . Pack years: 7.50  . Types: Cigarettes  . Start date: 10/21/1984  Smokeless Tobacco Never Used      ROS see history of present illness  Objective  Physical Exam Vitals:   01/13/19 1317  BP: 140/80  Pulse: 75  Temp: (!) 97 F (36.1 C)  SpO2: 98%    BP Readings from Last 3 Encounters:  01/13/19 140/80  09/12/18 110/74  02/14/18 110/70   Wt Readings from Last 3 Encounters:  01/13/19 147 lb 9.6 oz (67 kg)  09/12/18 153 lb 9.6 oz (69.7 kg)  02/14/18 148 lb 3.2 oz (67.2 kg)    Physical Exam Constitutional:      General: She is not in acute distress.    Appearance: She is not diaphoretic.  Cardiovascular:     Rate and Rhythm: Normal rate and regular rhythm.     Heart sounds: Normal heart sounds.  Pulmonary:     Effort: Pulmonary effort is normal.     Breath sounds: Normal breath sounds.  Musculoskeletal:     Right lower leg: No edema.     Left lower leg: No edema.  Skin:    General: Skin is warm and dry.  Neurological:     Mental Status: She is alert and oriented to person, place, and time.     Comments: CN 2-12 intact, 5/5 strength in bilateral biceps, triceps, grip, quads, hamstrings, plantar and dorsiflexion, sensation to light touch  intact in bilateral UE and LE, normal gait, 2+ patellar reflexes      Assessment/Plan: Please see individual problem list.  Confusion Patient with intermittent issues with confusion over a number of months.  She has a benign neurological exam today.  Her MMSE is in an abnormal range.  Discussed potential causes including dementia, electrolyte disturbance, thyroid abnormality, B12 abnormality, or intracerebral issue.  Discussed obtaining lab work and imaging.  We will obtain the lab work first and then proceed with imaging if labs are unremarkable given patient cost concerns.  Discussed the potential for need for referral to neurology.  Labs ordered today.  HA (headache) Increased frequency of headaches.  Discussed imaging for this though she is concerned about cost.  Will obtain labs first and then consider imaging.  Obtain lab  work as outlined below.  This could be related to her confusion issues.   Orders Placed This Encounter  Procedures  . Comp Met (CMET)  . CBC w/Diff  . TSH  . B12  . HgB A1c    No orders of the defined types were placed in this encounter.    Tommi Rumps, MD [place]

## 2019-01-13 NOTE — Assessment & Plan Note (Addendum)
Patient with intermittent issues with confusion over a number of months.  She has a benign neurological exam today.  Her MMSE is in an abnormal range.  Discussed potential causes including dementia, electrolyte disturbance, thyroid abnormality, B12 abnormality, or intracerebral issue.  Discussed obtaining lab work and imaging.  We will obtain the lab work first and then proceed with imaging if labs are unremarkable given patient cost concerns.  Discussed the potential for need for referral to neurology.  Labs ordered today.

## 2019-01-13 NOTE — Patient Instructions (Signed)
Nice to see you. You can start with lab work and then determine the next step in management.  You will likely need to have imaging of your brain and see a neurologist.

## 2019-01-19 ENCOUNTER — Other Ambulatory Visit: Payer: Self-pay | Admitting: Family Medicine

## 2019-01-19 DIAGNOSIS — G44229 Chronic tension-type headache, not intractable: Secondary | ICD-10-CM

## 2019-01-19 MED ORDER — "SYRINGE/NEEDLE (DISP) 25G X 1"" 3 ML MISC": 0 refills | Status: DC

## 2019-01-19 MED ORDER — CYANOCOBALAMIN 1000 MCG/ML IJ SOLN: INTRAMUSCULAR | 1 refills | Status: DC

## 2019-01-19 MED FILL — BD 3 ML SYRINGE 25GX1": 25G X 1" | 77 days supply | Qty: 5 | Fill #0

## 2019-01-19 MED FILL — BD 3 ML SYRINGE 25GX1: 25G X 1" | 77 days supply | Qty: 5 | Fill #0

## 2019-01-19 MED FILL — CYANOCOBALAMIN 1,000 MCG/ML: 1000 | 77 days supply | Qty: 5 | Fill #0

## 2019-01-20 ENCOUNTER — Encounter: Payer: Self-pay | Admitting: *Deleted

## 2019-02-07 ENCOUNTER — Ambulatory Visit
Admission: RE | Admit: 2019-02-07 | Discharge: 2019-02-07 | Disposition: A | Discharge status: Home / Self Care | Payer: 59 | Source: Ambulatory Visit | Attending: Family Medicine | Admitting: Family Medicine

## 2019-02-07 ENCOUNTER — Other Ambulatory Visit: Payer: Self-pay

## 2019-02-07 DIAGNOSIS — G44229 Chronic tension-type headache, not intractable: Secondary | ICD-10-CM | POA: Diagnosis not present

## 2019-02-07 DIAGNOSIS — R519 Headache, unspecified: Secondary | ICD-10-CM | POA: Diagnosis not present

## 2019-02-08 ENCOUNTER — Encounter: Payer: Self-pay | Admitting: Family Medicine

## 2019-02-08 ENCOUNTER — Ambulatory Visit (INDEPENDENT_AMBULATORY_CARE_PROVIDER_SITE_OTHER): Payer: 59 | Admitting: Family Medicine

## 2019-02-08 DIAGNOSIS — G8929 Other chronic pain: Secondary | ICD-10-CM

## 2019-02-08 DIAGNOSIS — E538 Deficiency of other specified B group vitamins: Secondary | ICD-10-CM | POA: Diagnosis not present

## 2019-02-08 DIAGNOSIS — M545 Low back pain, unspecified: Secondary | ICD-10-CM

## 2019-02-08 DIAGNOSIS — F32A Depression, unspecified: Secondary | ICD-10-CM

## 2019-02-08 DIAGNOSIS — R410 Disorientation, unspecified: Secondary | ICD-10-CM

## 2019-02-08 DIAGNOSIS — F329 Major depressive disorder, single episode, unspecified: Secondary | ICD-10-CM | POA: Diagnosis not present

## 2019-02-08 DIAGNOSIS — G44229 Chronic tension-type headache, not intractable: Secondary | ICD-10-CM

## 2019-02-08 DIAGNOSIS — F419 Anxiety disorder, unspecified: Secondary | ICD-10-CM | POA: Diagnosis not present

## 2019-02-08 NOTE — Progress Notes (Signed)
Virtual Visit via video Note  This visit type was conducted due to national recommendations for restrictions regarding the COVID-19 pandemic (e.g. social distancing).  This format is felt to be most appropriate for this patient at this time.  All issues noted in this document were discussed and addressed.  No physical exam was performed (except for noted visual exam findings with Video Visits).   I connected with Christina Galvan today at  3:15 PM EST by a video enabled telemedicine application and verified that I am speaking with the correct person using two identifiers. Location patient: car Location provider: work Persons participating in the virtual visit: patient, provider  I discussed the limitations, risks, security and privacy concerns of performing an evaluation and management service by telephone and the availability of in person appointments. I also discussed with the patient that there may be a patient responsible charge related to this service. The patient expressed understanding and agreed to proceed.  Reason for visit: Follow-up.  HPI: Confusion/headaches/anxiety: Patient notes these things continue to be an issue.  Some days are worse than others and some days are better.  She feels like she is in a bubble.  She will forget things that she started to do.  The B12 injections have not been beneficial thus far.  MRI did not reveal a cause.  She continues to have some headaches.  Some days are better than others.  They are not quite as bad as it used to be.  Anxiety has been a big issue.  She feels this daily.  Feels like she is shaking inside with this.  She notes no depression.  No SI.  She is on Zoloft.   ROS: See pertinent positives and negatives per HPI.  Past Medical History:  Diagnosis Date   Anxiety    Anxiety and depression    Depression with anxiety    GERD (gastroesophageal reflux disease)    History of frequent urinary tract infections    Hypertension     Kidney stones    Migraines     Past Surgical History:  Procedure Laterality Date   carpal tunnel     [place]   COLONOSCOPY WITH PROPOFOL N/A 02/12/2017   Procedure: COLONOSCOPY WITH PROPOFOL;  Surgeon: Jonathon Bellows, MD;  Location: [facility] ENDOSCOPY;  Service: Gastroenterology;  Laterality: N/A;    Family History  Problem Relation Age of Onset   Arthritis Mother    Heart disease Mother    Hypertension Mother    Diabetes Mother    Arrhythmia Mother        A-Fib    Cancer Father        Lung Cancer   Diabetes Brother    Asthma Daughter    Cancer Paternal Uncle        Prostate cancer   Heart disease Maternal Grandfather    Heart disease Paternal Grandfather     SOCIAL HX: Smoker   Current Outpatient Medications:    cyanocobalamin (,VITAMIN B-12,) 1000 MCG/ML injection, Inject 1000 mcg (1 mL) IM once weekly for 3 weeks and then once monthly, Disp: 10 mL, Rfl: 1   cyclobenzaprine (FLEXERIL) 10 MG tablet, Take 1 tablet (10 mg total) by mouth 3 (three) times daily as needed for up to 20 doses for muscle spasms., Disp: 20 tablet, Rfl: 0   famotidine (PEPCID) 40 MG tablet, Take 1 tablet (40 mg total) by mouth daily., Disp: 90 tablet, Rfl: 0  omeprazole (PRILOSEC) 20 MG capsule, TAKE 1 CAPSULE BY MOUTH ONCE A DAY, Disp: 30 capsule, Rfl: 2   rosuvastatin (CRESTOR) 20 MG tablet, Take 1 tablet (20 mg total) by mouth daily., Disp: 90 tablet, Rfl: 3   sertraline (ZOLOFT) 100 MG tablet, Take 2 tablets (200 mg total) by mouth daily., Disp: 180 tablet, Rfl: 1   SYRINGE-NEEDLE, DISP, 3 ML 25G X 1" 3 ML MISC, Use once weekly with B12 injection for 3 weeks and then once monthly., Disp: 12 each, Rfl: 0   trandolapril (MAVIK) 4 MG tablet, TAKE 1 TABLET BY MOUTH ONCE A DAY, Disp: 90 tablet, Rfl: 1  EXAM:  VITALS per patient if applicable: None  GENERAL: alert, oriented, appears well and in no acute distress  HEENT: atraumatic,  conjunttiva clear, no obvious abnormalities on inspection of external nose and ears  NECK: normal movements of the head and neck  LUNGS: on inspection no signs of respiratory distress, breathing rate appears normal, no obvious gross SOB, gasping or wheezing  CV: no obvious cyanosis  MS: moves all visible extremities without noticeable abnormality  PSYCH/NEURO: pleasant and cooperative, no obvious depression or anxiety, speech and thought processing grossly intact  ASSESSMENT AND PLAN:  Discussed the following assessment and plan:  Confusion This continues to be an issue.  We are going to refer to neurology.  Anxiety potentially could be contributing and thus will confer with her pharmacist regarding switching from Zoloft to Lexapro.  Chronic back pain Patient reports this continues to be an issue.  She was taking tramadol though she discontinued this related to her confusion though the confusion has not improved with stopping the tramadol.  We will confer with our clinical pharmacist regarding tramadol long-term use and whether or not it could be contributing to confusion.  Consider restarting tramadol in the future.  Anxiety and depression No depression though does note anxiety is quite an issue.  We will check into switching her from Zoloft to Lexapro.  HA (headache) Has had some improvement.  MRI was unremarkable for cause.  We are going to refer her to neurology for the confusion and they can address her headaches as well.  B12 deficiency Continue IM B12 supplementation.    I discussed the assessment and treatment plan with the patient. The patient was provided an opportunity to ask questions and all were answered. The patient agreed with the plan and demonstrated an understanding of the instructions.   The patient was advised to call back or seek an in-person evaluation if the symptoms worsen or if the condition fails to improve as anticipated.    Tommi Rumps, MD

## 2019-02-09 DIAGNOSIS — E538 Deficiency of other specified B group vitamins: Secondary | ICD-10-CM | POA: Insufficient documentation

## 2019-02-09 NOTE — Assessment & Plan Note (Signed)
Continue IM B12 supplementation.

## 2019-02-09 NOTE — Assessment & Plan Note (Signed)
Has had some improvement.  MRI was unremarkable for cause.  We are going to refer her to neurology for the confusion and they can address her headaches as well.

## 2019-02-09 NOTE — Assessment & Plan Note (Signed)
No depression though does note anxiety is quite an issue.  We will check into switching her from Zoloft to Lexapro.

## 2019-02-09 NOTE — Assessment & Plan Note (Signed)
Patient reports this continues to be an issue.  She was taking tramadol though she discontinued this related to her confusion though the confusion has not improved with stopping the tramadol.  We will confer with our clinical pharmacist regarding tramadol long-term use and whether or not it could be contributing to confusion.  Consider restarting tramadol in the future.

## 2019-02-09 NOTE — Assessment & Plan Note (Signed)
This continues to be an issue.  We are going to refer to neurology.  Anxiety potentially could be contributing and thus will confer with her pharmacist regarding switching from Zoloft to Lexapro.

## 2019-02-21 ENCOUNTER — Telehealth: Payer: Self-pay | Admitting: Family Medicine

## 2019-02-21 NOTE — Telephone Encounter (Signed)
Please see if the patient has continued on the flexeril. If so, I would like for her to stop use of this and we can see if that helps her confusion. I would also like to transition her to cymbalta for anxiety and her back pain. Is she ok making that switch? Thanks.

## 2019-02-21 NOTE — Telephone Encounter (Signed)
-----   Message from De Hollingshead, [facility] sent at 02/09/2019  5:48 PM EST ----- No, I don't know of anything about tramadol causing irreversible confusion after medication discontinuation. Is she still using cyclobenzaprine? Anything anticholinergic has been associated w/ confusion and increased risk of dementia.   Do you think duloxetine might be an option for anxiety + chronic back pain?   Either way, would probably do a small taper. Sertraline 100 mg daily x 5-7 days, then decrease to 50 mg while starting either escitalopram 10 mg or duloxetine 30 mg x 7 days, then stop sertraline. If escitalopram, I'd continue that for 6-8 weeks and reassess. Duloxetine, would increase to 60 mg daily after a week.   Catie ----- Message ----- From: Leone Haven, MD Sent: 02/09/2019  11:06 AM EST To: De Hollingshead, [facility]  Hey Catie,  This patient is on Zoloft 200 mg once daily.  I would like to transition her to the Lexapro.  Given that she is on max dose Zoloft I wanted to see if I needed to taper down and then transition to Lexapro or if I can do a straight switch to Lexapro.  She was also on tramadol previously for chronic low back pain.  She stopped this because she was having some issues with confusion and memory difficulty though the confusion and memory issues have not improved with the tramadol being stopped.  I wanted to see if you knew of any long-term memory issues related to chronic tramadol use.  I am not aware of any though wanted to check.  Thanks for your help.Randall Hiss

## 2019-02-22 NOTE — Telephone Encounter (Signed)
I called and spoke with the patient and she states she is ok with the switch but wanted to know if she had to do a taper or something to switch the medication and also she wanted to know if there are any side effects for Cymbalta?  I informed her I would ask and let her know.  Antowan Samford,cma

## 2019-02-23 MED ORDER — DULOXETINE HCL 30 MG PO CPEP: 30.0000 mg | ORAL_CAPSULE | Freq: Every day | ORAL | 1 refills | Status: DC

## 2019-02-23 MED FILL — DULoxetine HCL 30 MG CPEP: 30 | 90 days supply | Qty: 90 | Fill #0

## 2019-02-23 NOTE — Telephone Encounter (Addendum)
We will do a taper as follows.  Sertraline 100 mg (1 tablet) daily x 7 days, then decrease to 50 mg (1/2 tablet) while starting duloxetine 30 mg x 7 days, then stop sertraline.  There are always potential side effects with medications. Cymbalta is generally well tolerated. There can be GI upset and increased BP with this among other possible side effects. She could discuss common possible side effects with the pharmacist when she picks the medication up.   Please also confirm whether or not she still taking the Flexeril.  If she is still taking this I would like for her to discontinue that to see if it helps with her mental cloudiness issues.

## 2019-02-23 NOTE — Addendum Note (Signed)
Addended by: Leone Haven on: 02/23/2019 02:04 PM   Modules accepted: Orders

## 2019-02-24 NOTE — Addendum Note (Signed)
Addended by: Leone Haven on: 02/24/2019 04:54 PM   Modules accepted: Orders

## 2019-02-24 NOTE — Telephone Encounter (Signed)
I called and informed the patient of the taper instructions for her Zoloft and Cymbalta and she wrote it down and understood. The patient states she has not taken flexeril in 2 years so she is not taking it.  Christina Galvan,cma

## 2019-02-24 NOTE — Telephone Encounter (Signed)
Noted  

## 2019-03-21 ENCOUNTER — Other Ambulatory Visit: Payer: Self-pay | Admitting: Gastroenterology

## 2019-03-21 ENCOUNTER — Other Ambulatory Visit: Payer: Self-pay | Admitting: Family Medicine

## 2019-03-21 MED FILL — TRANDOLAPRIL 4 MG TABLET: 4 | 90 days supply | Qty: 90 | Fill #0

## 2019-03-22 MED FILL — FAMOTIDINE 20 MG TABS: 20 | 30 days supply | Qty: 60 | Fill #0

## 2019-03-23 MED FILL — CYANOCOBALAMIN 1,000 MCG/ML: 1000 | 77 days supply | Qty: 5 | Fill #1

## 2019-03-28 NOTE — Progress Notes (Deleted)
    For several months she reports confusion.  It started ***.  She reports occasional mild tremor in her hands and feeling sweaty.  No loss of consciousness.  She denies drug and alcohol use.  She denies starting new medications prior to onset of symptoms.  She had been taking tramadol for ***.  She stopped this but symptoms did not improve.  She denies family history of early dementia.  For ***, she reports headaches.  ***  Labs from 01/13/2019 include normal CBC and CMP; normal TSH of 0.63; but low B12 of 170.  She was started on B12 injections.  MRI of brain without contrast from 02/07/2019 was personally reviewed and was unremarkable, demonstrating only mild chronic small vessel ischemic changes.  Current NSAIDs/analgesics:  *** Current antidepressant:  Cymbalta 30mg  daily

## 2019-03-29 ENCOUNTER — Ambulatory Visit: Payer: 59 | Admitting: Neurology

## 2019-04-01 ENCOUNTER — Encounter: Payer: Self-pay | Admitting: Family Medicine

## 2019-04-03 ENCOUNTER — Telehealth: Payer: Self-pay | Admitting: Lab

## 2019-04-03 NOTE — Telephone Encounter (Signed)
Called Pt and gave her the message from NP Mable Paris.  Pt stated she would not be able to go to Urgent Care today she has her grand baby. Pt stated she could Not go to Urgent care tomorrow because she works.  I told Pt it was Very important to go to Urgent care and be seen. Pt stated she would try to go and be seen and will let us know.

## 2019-04-05 NOTE — Telephone Encounter (Signed)
I called and spoke with patient. She said that she is still having the dizzy spells with nausea. She said that she did not go to UC because she had an appointment Friday with neurology. She said that he HA is a constant dull HA & is sometimes relieved by placing ice on her tongue. She said that this has been going on for the last 3-4 months & she has made Dr. [name] aware of the dizziness she has been experiencing. Patient was agreeable to try to make an appointment with UC across the street today or tomorrow. Patient stated that she works at [employer] as a [employer] is off the next couple days.   One a side note she said that since this has been going for so long that she wondered if it could be something with her mirena that she said was 4-5 years over due to come out? I told her that I had no clue if the two could be related. She was following with [facility] but hasn't been back to see them.

## 2019-04-05 NOTE — Telephone Encounter (Signed)
LMTCB. I do see where patient has appointment with neurology on 04/07/19.

## 2019-04-05 NOTE — Telephone Encounter (Signed)
Call pt She has sent Dr. Vallery Ridge message in regards to syncopal episode.  We advised her to go to urgent care couple days ago.  I do not see that she has been seen.  Please triage patient ensure she has follow-up scheduled with Dr. [name]

## 2019-04-06 NOTE — Progress Notes (Deleted)
NEUROLOGY CONSULTATION NOTE  Christina Galvan MRN: [id number] DOB: September 05, 1965  Referring provider: Tommi Rumps, MD Primary care provider: Tommi Rumps, MD  Reason for consult:  Confusion, headache  HISTORY OF PRESENT ILLNESS: Christina Galvan is a 54 year old female *** who presents for confusion and headache.  History supplemented by referring provider note.  For several months she reports confusion.  It started ***.  She reports occasional mild tremor in her hands and feeling sweaty.  No loss of consciousness.  She denies preceding head trauma.  She denies drug and alcohol use.  She denies starting new medications prior to onset of symptoms.  She had been taking tramadol for ***.  She stopped this but symptoms did not improve.  She denies family history of early dementia.  For ***, she reports headaches.  ***  Labs from 01/13/2019 include normal CBC and CMP; normal TSH of 0.63; but low B12 of 170.  She was started on B12 injections.  MRI of brain without contrast from 02/07/2019 was personally reviewed and was unremarkable, demonstrating only mild chronic small vessel ischemic changes.  Current NSAIDs/analgesics:  *** Current antidepressant:  Cymbalta 30mg  daily  PAST MEDICAL HISTORY: Past Medical History:  Diagnosis Date  . Anxiety   . Anxiety and depression   . Depression with anxiety   . GERD (gastroesophageal reflux disease)   . History of frequent urinary tract infections   . Hypertension   . Kidney stones   . Migraines     PAST SURGICAL HISTORY: Past Surgical History:  Procedure Laterality Date  . carpal tunnel    . CESAREAN SECTION  1993  . CHOLECYSTECTOMY  1994  . COLONOSCOPY WITH PROPOFOL N/A 02/12/2017   Procedure: COLONOSCOPY WITH PROPOFOL;  Surgeon: Jonathon Bellows, MD;  Location: [facility] ENDOSCOPY;  Service: Gastroenterology;  Laterality: N/A;    MEDICATIONS: Current Outpatient Medications on File Prior to Visit  Medication Sig Dispense Refill  .  cyanocobalamin (,VITAMIN B-12,) 1000 MCG/ML injection Inject 1000 mcg (1 mL) IM once weekly for 3 weeks and then once monthly 10 mL 1  . DULoxetine (CYMBALTA) 30 MG capsule Take 1 capsule (30 mg total) by mouth daily. 90 capsule 1  . famotidine (PEPCID) 20 MG tablet TAKE 2 TABLETS BY MOUTH ONCE DAILY 60 tablet 0  . omeprazole (PRILOSEC) 20 MG capsule TAKE 1 CAPSULE BY MOUTH ONCE A DAY 30 capsule 2  . rosuvastatin (CRESTOR) 20 MG tablet Take 1 tablet (20 mg total) by mouth daily. 90 tablet 3  . SYRINGE-NEEDLE, DISP, 3 ML 25G X 1" 3 ML MISC Use once weekly with B12 injection for 3 weeks and then once monthly. 12 each 0  . trandolapril (MAVIK) 4 MG tablet TAKE 1 TABLET BY MOUTH ONCE A DAY 90 tablet 1   No current facility-administered medications on file prior to visit.    ALLERGIES: Allergies  Allergen Reactions  . Thiazide-Type Diuretics     Hypokalemia, Hyponatremia, Chest pain  . Mushroom Extract Complex   . Sulfa Antibiotics     FAMILY HISTORY: Family History  Problem Relation Age of Onset  . Arthritis Mother   . Heart disease Mother   . Hypertension Mother   . Diabetes Mother   . Arrhythmia Mother        A-Fib   . Cancer Father        Lung Cancer  . Diabetes Brother   . Asthma Daughter   . Cancer Paternal Uncle  Prostate cancer  . Heart disease Maternal Grandfather   . Heart disease Paternal Grandfather    SOCIAL HISTORY: Social History   Socioeconomic History  . Marital status: Single    Spouse name: Not on file  . Number of children: Not on file  . Years of education: Not on file  . Highest education level: Not on file  Occupational History  . Not on file  Tobacco Use  . Smoking status: Current Every Day Smoker    Packs/day: 0.25    Years: 30.00    Pack years: 7.50    Types: Cigarettes    Start date: 10/21/1984  . Smokeless tobacco: Never Used  Substance and Sexual Activity  . Alcohol use: Yes    Alcohol/week: 0.0 standard drinks    Comment: Rare    . Drug use: No  . Sexual activity: Not Currently    Partners: Male    Birth control/protection: I.U.D.  Other Topics Concern  . Not on file  Social History Narrative   Works at [employer] with son and daughter   Pets: None   Caffeine- 1 20 oz bottle of soda    Social Determinants of Health   Financial Resource Strain:   . Difficulty of Paying Living Expenses: Not on file  Food Insecurity:   . Worried About Charity fundraiser in the Last Year: Not on file  . Ran Out of Food in the Last Year: Not on file  Transportation Needs:   . Lack of Transportation (Medical): Not on file  . Lack of Transportation (Non-Medical): Not on file  Physical Activity:   . Days of Exercise per Week: Not on file  . Minutes of Exercise per Session: Not on file  Stress:   . Feeling of Stress : Not on file  Social Connections:   . Frequency of Communication with Friends and Family: Not on file  . Frequency of Social Gatherings with Friends and Family: Not on file  . Attends Religious Services: Not on file  . Active Member of Clubs or Organizations: Not on file  . Attends Archivist Meetings: Not on file  . Marital Status: Not on file  Intimate Partner Violence:   . Fear of Current or Ex-Partner: Not on file  . Emotionally Abused: Not on file  . Physically Abused: Not on file  . Sexually Abused: Not on file    PHYSICAL EXAM: *** General: No acute distress.  Patient appears ***-groomed.  *** Head:  Normocephalic/atraumatic Eyes:  fundi examined but not visualized Neck: supple, no paraspinal tenderness, full range of motion Back: No paraspinal tenderness Heart: regular rate and rhythm Lungs: Clear to auscultation bilaterally. Vascular: No carotid bruits. Neurological Exam: Mental status: alert and oriented to person, place, and time, recent and remote memory intact, fund of knowledge intact, attention and concentration intact, speech fluent and not dysarthric, language  intact. Cranial nerves: CN I: not tested CN II: pupils equal, round and reactive to light, visual fields intact CN III, IV, VI:  full range of motion, no nystagmus, no ptosis CN V: facial sensation intact CN VII: upper and lower face symmetric CN VIII: hearing intact CN IX, X: gag intact, uvula midline CN XI: sternocleidomastoid and trapezius muscles intact CN XII: tongue midline Bulk & Tone: normal, no fasciculations. Motor:  5/5 throughout *** Sensation:  Pinprick *** temperature *** and vibration sensation intact.  ***. Deep Tendon Reflexes:  2+ throughout, *** toes downgoing.  ***  Finger to nose testing:  Without dysmetria.  *** Heel to shin:  Without dysmetria.  *** Gait:  Normal station and stride.  Able to turn and tandem walk. Romberg ***.  IMPRESSION: ***  PLAN: ***  Thank you for allowing me to take part in the care of this patient.  Metta Clines, DO  CC: ***

## 2019-04-07 ENCOUNTER — Other Ambulatory Visit: Payer: Self-pay | Admitting: Family Medicine

## 2019-04-07 ENCOUNTER — Other Ambulatory Visit: Payer: Self-pay

## 2019-04-07 ENCOUNTER — Ambulatory Visit: Payer: 59 | Admitting: Neurology

## 2019-04-07 DIAGNOSIS — E785 Hyperlipidemia, unspecified: Secondary | ICD-10-CM

## 2019-04-07 MED FILL — BD 3 ML SYRINGE 25GX1": 25G X 1" | 180 days supply | Qty: 7 | Fill #1

## 2019-04-07 MED FILL — CYANOCOBALAMIN 1,000 MCG/ML: 1000 | 84 days supply | Qty: 3 | Fill #1

## 2019-04-07 MED FILL — BD 3 ML SYRINGE 25GX1: 25G X 1" | 180 days supply | Qty: 7 | Fill #1

## 2019-04-07 NOTE — Telephone Encounter (Signed)
Call pt Covering for sonnenberg Believe she is seeing jr jaffe today; wanted to be sure syncope, dizziness addresssed Please advise f/u appt with [facility] Advise UC if she has another syncopal spell. Regards to the mirena, I do not know that it is related however I would strongly advise that she calls [facility]/ OB GYN and schedule an appointment for Mirena to be removed

## 2019-04-07 NOTE — Telephone Encounter (Signed)
I called and spoke with the patient and she stated she was late and they made her reschedule. I informed her if she has another syncope episode to go to UC and to call and schedule a f/up with OB/GYN about her Mirena. Patient understood.   Donneisha Beane,cma

## 2019-04-07 NOTE — Telephone Encounter (Signed)
Called and left a message to call back.  Raimi Guillermo,cma

## 2019-04-12 MED FILL — ROSUVASTATIN CALCIUM 20 MG: 20 | 90 days supply | Qty: 90 | Fill #0

## 2019-04-30 ENCOUNTER — Encounter: Payer: Self-pay | Admitting: Family Medicine

## 2019-05-29 NOTE — Progress Notes (Signed)
NEUROLOGY CONSULTATION NOTE  Christina Galvan MRN: [id number] DOB: Nov 03, 1965  Referring provider: Tommi Rumps, MD Primary care provider: Tommi Rumps, MD  Reason for consult:  Headache, confusion  HISTORY OF PRESENT ILLNESS: Christina Galvan is a 54 year old right-handed female with HTN, chronic low back pain, migraines, depression, anxiety and history of kidney stones who presents for headache and confusion.  History supplemented by referring provider note.  Over the past year, she has had several medication changes for her anxiety and episodes of confusion.  She was switched from buspirone to sertraline.  A few months ago, she was switched from sertraline to Cymbalta 30mg  in attempt to better treat anxiety and sciatic pain.  She reports worsening symptoms since then.  She describes episodes where she feels like she is in a fog, feeling more angry and sometimes has hallucinations of people walking.  It is typically elicited when she is upset about something.  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  She reports that she sometimes files papers in the wrong chart or will sometimes forget names.  For the past 3 months, she also reports a persistent dull non-throbbing bifrontal headache.  No associated nausea, vomiting, visual disturbance, numbness or weakness.  She had been taking Goody powder daily.  Around the same time that her symptoms worsened, she reported increased emotional stress.  She had to financially support her son who was in legal trouble, which caused her to lose her home.  She had to move in with her mother.  She also has chronic low back pain with sciatic pain.  She previously took tramadol daily which was discontinued.  Labs from November 2020 showed low B12 of 170 but otherwise was unremarkable including CBC, CMP, and TSH (0.63).  She was started on B12 injections but reports no improvement.  MRI of brain without  contrast from 02/07/2019 personally reviewed showed mild chronic small vessel ischemic changes but overall unremarkable.  Eye exam overall unremarkable.  She got new glasses but no difference in headaches.  Current NSAIDS:  none Current analgesics:  Goodys Current triptans:  none Current ergotamine:  none Current anti-emetic:  none Current muscle relaxants:  none Current anti-anxiolytic:  none Current sleep aide:  none Current Antihypertensive medications:  none Current Antidepressant medications:  Cymbalta 30mg  daily Current Anticonvulsant medications:  none Current anti-CGRP:  none Current Vitamins/Herbal/Supplements:  B12 1023mcg injections monthly Current Antihistamines/Decongestants:  none Other therapy:  none Hormone/birth control:  none  Past NSAIDS:  Ibuprofen, naproxen Past analgesics:  tramadol Past abortive triptans:  none Past abortive ergotamine:  none Past muscle relaxants:  none Past anti-emetic:  none Past antihypertensive medications:  none Past antidepressant medications:  Sertraline 200mg  daily Past anticonvulsant medications:  none Past anti-CGRP:  none   Past history:  No history of seizures, head trauma   PAST MEDICAL HISTORY: Past Medical History:  Diagnosis Date  . Anxiety   . Anxiety and depression   . Depression with anxiety   . GERD (gastroesophageal reflux disease)   . History of frequent urinary tract infections   . Hypertension   . Kidney stones   . Migraines     PAST SURGICAL HISTORY: Past Surgical History:  Procedure Laterality Date  . carpal tunnel    . CESAREAN SECTION  1993  . CHOLECYSTECTOMY  1994  . COLONOSCOPY WITH PROPOFOL N/A 02/12/2017   Procedure: COLONOSCOPY WITH PROPOFOL;  Surgeon: Jonathon Bellows, MD;  Location: [facility] ENDOSCOPY;  Service: Gastroenterology;  Laterality: N/A;    MEDICATIONS: Current Outpatient Medications on File Prior to Visit  Medication Sig Dispense Refill  . cyanocobalamin (,VITAMIN B-12,) 1000  MCG/ML injection Inject 1000 mcg (1 mL) IM once weekly for 3 weeks and then once monthly 10 mL 1  . DULoxetine (CYMBALTA) 30 MG capsule Take 1 capsule (30 mg total) by mouth daily. 90 capsule 1  . famotidine (PEPCID) 20 MG tablet TAKE 2 TABLETS BY MOUTH ONCE DAILY 60 tablet 0  . omeprazole (PRILOSEC) 20 MG capsule TAKE 1 CAPSULE BY MOUTH ONCE A DAY 30 capsule 2  . rosuvastatin (CRESTOR) 20 MG tablet TAKE 1 TABLET BY MOUTH ONCE DAILY 90 tablet 3  . SYRINGE-NEEDLE, DISP, 3 ML 25G X 1" 3 ML MISC Use once weekly with B12 injection for 3 weeks and then once monthly. 12 each 0  . trandolapril (MAVIK) 4 MG tablet TAKE 1 TABLET BY MOUTH ONCE A DAY 90 tablet 1   No current facility-administered medications on file prior to visit.    ALLERGIES: Allergies  Allergen Reactions  . Thiazide-Type Diuretics     Hypokalemia, Hyponatremia, Chest pain  . Mushroom Extract Complex   . Sulfa Antibiotics     FAMILY HISTORY: Family History  Problem Relation Age of Onset  . Arthritis Mother   . Heart disease Mother   . Hypertension Mother   . Diabetes Mother   . Arrhythmia Mother        A-Fib   . Cancer Father        Lung Cancer  . Diabetes Brother   . Asthma Daughter   . Cancer Paternal Uncle        Prostate cancer  . Heart disease Maternal Grandfather   . Heart disease Paternal Grandfather      SOCIAL HISTORY: Social History   Socioeconomic History  . Marital status: Single    Spouse name: Not on file  . Number of children: Not on file  . Years of education: Not on file  . Highest education level: Not on file  Occupational History  . Not on file  Tobacco Use  . Smoking status: Current Every Day Smoker    Packs/day: 0.25    Years: 30.00    Pack years: 7.50    Types: Cigarettes    Start date: 10/21/1984  . Smokeless tobacco: Never Used  Substance and Sexual Activity  . Alcohol use: Yes    Alcohol/week: 0.0 standard drinks    Comment: Rare  . Drug use: No  . Sexual activity: Not  Currently    Partners: Male    Birth control/protection: I.U.D.  Other Topics Concern  . Not on file  Social History Narrative   Works at [employer] with son and daughter   Pets: None   Caffeine- 1 20 oz bottle of soda    Social Determinants of Health   Financial Resource Strain:   . Difficulty of Paying Living Expenses:   Food Insecurity:   . Worried About Charity fundraiser in the Last Year:   . Arboriculturist in the Last Year:   Transportation Needs:   . Film/video editor (Medical):   Marland Kitchen Lack of Transportation (Non-Medical):   Physical Activity:   . Days of Exercise per Week:   . Minutes of Exercise per Session:   Stress:   . Feeling of Stress :   Social Connections:   . Frequency  of Communication with Friends and Family:   . Frequency of Social Gatherings with Friends and Family:   . Attends Religious Services:   . Active Member of Clubs or Organizations:   . Attends Archivist Meetings:   Marland Kitchen Marital Status:   Intimate Partner Violence:   . Fear of Current or Ex-Partner:   . Emotionally Abused:   Marland Kitchen Physically Abused:   . Sexually Abused:     REVIEW OF SYSTEMS: Constitutional: No fevers, chills, or sweats, no generalized fatigue, change in appetite Eyes: No visual changes, double vision, eye pain Ear, nose and throat: No hearing loss, ear pain, nasal congestion, sore throat Cardiovascular: No chest pain, palpitations Respiratory:  No shortness of breath at rest or with exertion, wheezes GastrointestinaI: No nausea, vomiting, diarrhea, abdominal pain, fecal incontinence Genitourinary:  No dysuria, urinary retention or frequency Musculoskeletal:  No neck pain, back pain Integumentary: No rash, pruritus, skin lesions Neurological: as above Psychiatric: No depression, insomnia, anxiety Endocrine: No palpitations, fatigue, diaphoresis, mood swings, change in appetite, change in weight, increased thirst Hematologic/Lymphatic:  No purpura,  petechiae. Allergic/Immunologic: no itchy/runny eyes, nasal congestion, recent allergic reactions, rashes  PHYSICAL EXAM: Blood pressure 122/76, pulse 78, height 5\' 3"  (1.6 m), weight 160 lb (72.6 kg), SpO2 96 %. General: No acute distress.  Patient appears well-groomed.  Head:  Normocephalic/atraumatic Eyes:  fundi examined but not visualized Neck: supple, no paraspinal tenderness, full range of motion Back: No paraspinal tenderness Heart: regular rate and rhythm Lungs: Clear to auscultation bilaterally. Vascular: No carotid bruits. Neurological Exam: Mental status: alert and oriented to person, place, and time except she said that the year was either 2001 or 2002; recent memory poor; remote memory intact, fund of knowledge intact, attention and concentration intact, speech fluent and not dysarthric, language intact. Montreal Cognitive Assessment Blind 05/30/2019  Attention: Read list of digits (0/2) 1  Attention: Read list of letters (0/1) 1  Attention: Serial 7 subtraction starting at 100 (0/3) 1  Language: Repeat phrase (0/2) 1  Language : Fluency (0/1) 0  Abstraction (0/2) 1  Delayed Recall (0/5) 0  Orientation (0/6) 5  Total 10   Cranial nerves: CN I: not tested CN II: pupils equal, round and reactive to light, visual fields intact CN III, IV, VI:  full range of motion, no nystagmus, no ptosis CN V: facial sensation intact CN VII: upper and lower face symmetric CN VIII: hearing intact CN IX, X: gag intact, uvula midline CN XI: sternocleidomastoid and trapezius muscles intact CN XII: tongue midline Bulk & Tone: normal, no fasciculations. Motor:  5/5 throughout  Sensation:  Pinprick and vibration sensation intact. Deep Tendon Reflexes:  2+ throughout, toes downgoing.  Finger to nose testing:  Without dysmetria.  Heel to shin:  Without dysmetria.  Gait:  Normal station and stride.  Able to turn.  Romberg with sway.  IMPRESSION: 1.  Chronic tension-type headache,  intractable, likely secondary to emotional stress and medication-overuse. 2.  Memory deficits.  I suspect that this is related to her anxiety rather than an organic etiology.  She scored low on the [employer] Blind test which appeared to be related to decreased effort.  B12 deficiency may be a factor although she has not yet noted improvement.  However, given that it is affecting her job, further testing is warranted.  PLAN: 1.  Increase Cymbalta to 60mg  daily to better address headache and anxiety. 2.  Advised to stop Goodys 3.  Will recheck B12 level 4.  Will order neuropsychological testing 5.  Follow up in 4 months.  Thank you for allowing me to take part in the care of this patient.  Metta Clines, DO  CC: Tommi Rumps, MD

## 2019-05-30 ENCOUNTER — Ambulatory Visit (INDEPENDENT_AMBULATORY_CARE_PROVIDER_SITE_OTHER): Payer: 59 | Admitting: Neurology

## 2019-05-30 ENCOUNTER — Other Ambulatory Visit: Payer: 59

## 2019-05-30 ENCOUNTER — Encounter: Payer: Self-pay | Admitting: Neurology

## 2019-05-30 ENCOUNTER — Other Ambulatory Visit: Payer: Self-pay

## 2019-05-30 VITALS — BP 122/76 | HR 78 | Ht 63.0 in | Wt 160.0 lb

## 2019-05-30 DIAGNOSIS — E538 Deficiency of other specified B group vitamins: Secondary | ICD-10-CM

## 2019-05-30 DIAGNOSIS — R4189 Other symptoms and signs involving cognitive functions and awareness: Secondary | ICD-10-CM

## 2019-05-30 DIAGNOSIS — G44221 Chronic tension-type headache, intractable: Secondary | ICD-10-CM

## 2019-05-30 MED ORDER — DULOXETINE HCL 60 MG PO CPEP: 60.0000 mg | ORAL_CAPSULE | Freq: Every day | ORAL | 3 refills | Status: DC

## 2019-05-30 MED FILL — DULOXETINE HCL 60 MG CPEP: 60 | 30 days supply | Qty: 30 | Fill #0

## 2019-05-30 NOTE — Patient Instructions (Signed)
1.  Stop taking Goodys 2.  I have increased Cymbalta to 60mg  daily 3.  We will check B12 level and refer you for neurocognitive testing 4.  Follow up in 4 months.

## 2019-05-31 ENCOUNTER — Telehealth: Payer: Self-pay

## 2019-05-31 LAB — VITAMIN B12: Vitamin B-12: 301 pg/mL (ref 200–1100)

## 2019-05-31 NOTE — Telephone Encounter (Signed)
Vitamin B12 level is improved but in the low normal range which may still cause memory issues. It is 301 and people may be symptomatic with B12 deficiency with levels less than 400.  Continued treatment with B12 shots should continue. Pt will continue her shots

## 2019-06-01 ENCOUNTER — Other Ambulatory Visit: Payer: Self-pay | Admitting: Gastroenterology

## 2019-06-01 MED FILL — FAMOTIDINE 20 MG TABLET: 20 | 90 days supply | Qty: 180 | Fill #0

## 2019-06-01 NOTE — Telephone Encounter (Signed)
Ok to refill - meds are infact OTC

## 2019-07-04 MED FILL — DULOXETINE HCL 60 MG CPEP: 60 | 30 days supply | Qty: 30 | Fill #1

## 2019-07-04 MED FILL — ROSUVASTATIN CALCIUM 20 MG: 20 | 90 days supply | Qty: 90 | Fill #1

## 2019-07-13 ENCOUNTER — Ambulatory Visit (INDEPENDENT_AMBULATORY_CARE_PROVIDER_SITE_OTHER): Payer: 59 | Admitting: Counselor

## 2019-07-13 ENCOUNTER — Encounter: Payer: Self-pay | Admitting: Counselor

## 2019-07-13 ENCOUNTER — Ambulatory Visit: Payer: 59

## 2019-07-13 ENCOUNTER — Other Ambulatory Visit: Payer: Self-pay

## 2019-07-13 DIAGNOSIS — R4189 Other symptoms and signs involving cognitive functions and awareness: Secondary | ICD-10-CM

## 2019-07-13 DIAGNOSIS — F418 Other specified anxiety disorders: Secondary | ICD-10-CM

## 2019-07-13 DIAGNOSIS — F09 Unspecified mental disorder due to known physiological condition: Secondary | ICD-10-CM

## 2019-07-13 NOTE — Progress Notes (Signed)
   Psychometrist Note   Cognitive testing was administered to Christina Galvan by Lamar Benes, B.S. (Technician) under the supervision of Alphonzo Severance, Psy.D., ABN. [name] was able to tolerate all test procedures. Dr. Nicole Kindred met with the patient as needed to manage any emotional reactions to the testing procedures (if applicable). Rest breaks were offered.    The battery of tests administered was selected by Dr. Nicole Kindred with consideration to the patient's current level of functioning, the nature of her symptoms, emotional and behavioral responses during the interview, level of literacy, observed level of motivation/effort, and the nature of the referral question. This battery was communicated to the psychometrist. Communication between Dr. Nicole Kindred and the psychometrist was ongoing throughout the evaluation and Dr. Nicole Kindred was immediately accessible at all times. Dr. Nicole Kindred provided supervision to the technician on the date of this service, to the extent necessary to assure the quality of all services provided.    [name] will return in approximately one week for an interactive feedback session with Dr. Nicole Kindred, at which time female test performance, clinical impressions, and treatment recommendations will be reviewed in detail. The patient understands she can contact our office should she require our assistance before this time.   A total of 130 minutes of billable time were spent with Christina Galvan by the technician, including test administration and scoring time. Billing for these services is reflected in Dr. Les Pou note.   This note reflects time spent with the psychometrician and does not include test scores, clinical history, or any interpretations made by Dr. Nicole Kindred. The full report will follow in a separate note.

## 2019-07-13 NOTE — Progress Notes (Signed)
[facility]  Patient Name: Christina Galvan MRN: [id number] Date of Birth: 09/10/1965 Age: 54 y.o. Education: 12 years  Referral Circumstances and Background Information  Christina Galvan is a 54 y.o., right-hand dominant, woman with a history of low back pain, anxiety, depression, and migraine headaches who was seen recently by Dr. Tomi Likens for consultation of those issues and memory changes. She obtained a 10 on the MoCA blind at that visit . She was referred for neuropsychological evaluation to better characterize her current cognitive problems.    On interview, the patient reported that "I don't feel like the person I was, something is different" and she would like to get back to how she used to feel. She started having memory and thinking problems over at least the past year and she feels like they are worsening over time. She was under a fair amount of stress when her problems began, her boyfriend died Thanksgiving, 2020, related to complications of chronic alcohol abuse. She has had, in general, a fair amount of stress over the past 4 years also, her son got arrested and she needed to support him and ended up losing her house. 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 She will sometimes drive past an exit that she intended to take when driving but then typically realizes it within a few minutes. People have noticed her problems at work and "make fun of" her over the past six months, which agitates her. I was able to talk to the patient's daughter who said that she is concerned about the patient and noted the affective issues, although the patient does also have cognitive problems. She sometimes has a hard time using her smart phone. She can still cook but will lose her place while cooking. She stated that  the patient is very forgetful and can't seem to learn her schedule despite the fact that it doesn't change much. She also commented that the patient feels like she "can't do anything right," and says people at her work will do her work for her because she is having such difficulties. She stated she thinks there's something wrong with her eyes because she no longer sees clearly when reading, particularly on electronic screens. She has a hard time writing down her schedule from the computer at work.   Affectively, the patient has been having a lot of difficulties with emotion regulation and anger and feels like "something clicks and I just go into anger," which can happen at work or with her kids. It sounds like she is also quite depressed and is more tearful than in the past, she feels sad to the point of tearfulness about 3 days a week for half a day. She feels hopeless, sometimes, like "I don't know what happened to me." She has had to involve her daughter in helping her remember when her schedule is because otherwise, she will forget it. Her daughter stated that she is becoming isolative and no longer likes to go out to the store or do things like she used to. She is sleeping excessively and will spend long periods of time in bed and has a disturbed sleep schedule. She said she will go to bed at 2am, sleep until 5am, then go back to bed until 11pm at night and get up for a  few hours before repeating that, although she seemed somewhat confused when assembling this timeline so I'm not sure if it is accurate. She feels like she is "never hungry," although she hasn't been losing weight because she is still eating. Her energy is low and she feels tired much of the time. She denied any thoughts of harming herself or anybody else. She stated that she is having some visual illusions, she will think she sees someone walking through the door, although it doesn't sound like it is a formed visual hallucination, it is a  perception of movement in the periphery of her visual field. She stated it "comes and goes" and "is not a lot," she thinks it stopped when they upped the medicine.   With respect to functioning, the patient is still working, 3 x 12 hour shifts, although she has been calling out a fair amount because she feels like she "cant be around anybody." Her problems have been noticed by coworkers who have started to do some of her tasks on the job for her. She has most of her finances on auto pay because she was forgetting them. She is independent with respect to medications. She is not getting lost with driving or having accidents but as above, she drives past exits and it makes her angry.   Past Medical History and Review of Relevant Studies   Patient Active Problem List   Diagnosis Date Noted  . B12 deficiency 02/09/2019  . Confusion 01/13/2019  . HA (headache) 01/13/2019  . Vertigo 06/08/2018  . Unintentional weight loss 11/29/2016  . Hyperlipidemia 08/24/2016  . Hypokalemia 12/09/2015  . Exertional chest pain 11/30/2015  . Chronic back pain 10/31/2015  . Tobacco abuse 10/31/2015  . Essential hypertension 10/31/2015  . GERD (gastroesophageal reflux disease) 10/28/2014  . Migraines 10/28/2014  . Anxiety and depression 10/28/2014    Review of Neuroimaging and Relevant Medical History: :  The patient has an MRI brain from 02/07/2019 that shows mild areas of leukoaraiosis suggestive of white matter ischemic changes, borderline as a contributor to cognitive problems given amount. Her brain volume is within gross limits of normal for her age, although there is some asymmetry of the lateral ventricles (L > R) and there does appear to be somewhat more volume loss on that side to my eye. It is not clearly pathological in amount and could represent normal variability.    Current Outpatient Medications  Medication Sig Dispense Refill  . cyanocobalamin (,VITAMIN B-12,) 1000 MCG/ML injection Inject 1000  mcg (1 mL) IM once weekly for 3 weeks and then once monthly 10 mL 1  . DULoxetine (CYMBALTA) 60 MG capsule Take 1 capsule (60 mg total) by mouth daily. 30 capsule 3  . famotidine (PEPCID) 20 MG tablet Take 2 tablets (40 mg total) by mouth daily. 180 tablet 1  . omeprazole (PRILOSEC) 20 MG capsule TAKE 1 CAPSULE BY MOUTH ONCE A DAY 30 capsule 2  . rosuvastatin (CRESTOR) 20 MG tablet TAKE 1 TABLET BY MOUTH ONCE DAILY 90 tablet 3  . SYRINGE-NEEDLE, DISP, 3 ML 25G X 1" 3 ML MISC Use once weekly with B12 injection for 3 weeks and then once monthly. 12 each 0  . trandolapril (MAVIK) 4 MG tablet TAKE 1 TABLET BY MOUTH ONCE A DAY (Patient not taking: Reported on 05/30/2019) 90 tablet 1   No current facility-administered medications for this visit.    Family History  Problem Relation Age of Onset  . Arthritis Mother   . Heart  disease Mother   . Hypertension Mother   . Diabetes Mother   . Arrhythmia Mother        A-Fib   . Cancer Father        Lung Cancer  . Diabetes Brother   . Asthma Daughter   . Cancer Paternal Uncle        Prostate cancer  . Heart disease Maternal Grandfather   . Heart disease Paternal Grandfather    There is a family history of dementia, the patient stated that her mother is "in it now" although she won't see a doctor to get it diagnosed. She stated she sometimes gets paranoid about people being on the porch at night and she had a stroke two years ago. She thought that her maternal grandmother also had it, but once again it was not diagnosed, she said "the only person she knew was her one daughter," and she required nursing home care. There is no  family history of psychiatric illness.  Psychosocial History  Developmental, Educational and Employment History: The patient described herself as an average student who was never held back and didn't have any learning difficulties. 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    Psychiatric History: The patient denied any previous history of involvement in mental health services. She is currently on antidepressants and is being titrated, she was on buspirone and was then switched to sertraline and is now on Cymbalta 60mg . She thinks that helps. She has never been a psychiatric inpatient, has never gotten counseling, and has no history of suicide attempts.    Substance Use History: The patient doesn't consume alcohol, she doesn't use any drugs. She smokes "occasionally," maybe once a week. She used to be a heavy smoker but quit about 4 years ago, she had smoked since she was in her mid 48s and smoked off and on since then.   Relationship History and Living Cimcumstances: The patient has never been married. She has a son and a daughter, her son is in his early 14s and her daughter is in her 64s. She is not in a relationship now. She lives with her mother and her sister and her brother also live there.   Mental Status and Behavioral Observations  Sensorium/Arousal: The patient's level of arousal was awake and alert. Hearing and vision were adequate with correction (I.e., glasses) for testing purposes. Orientation: The patient was oriented to person, place, generally to time (did report it was the 9th) and to date.  Appearance: Dressed in appropriate, casual clothing Behavior: The patient was a somewhat vague historian. She presented as having a very hard time with perceiving some of the test stimuli and visual scanning, often using her finger to assist.  Speech/language: Normal in rate, rhythm, and volume without paraphasic errors or word finding problems.  Gait/Posture: Appeared normal on ambulation within the clinic.  Movement: No overt movement symptoms noted on observation. Patient has  previously reported intermittent bilateral tremors to Dr. Victoriano Lain Motor/Sensory Functioning: The patient's upper limb praxis was assessed for hammering, screwdriver use, scissor use, and scrambling and was impaired bilaterally. 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 She was impaired for scrambling (hand position error) and scissor use on the left despite doing that after using scissors on the right. Performance was worse in the non-dominant hand. Motor programming was poor, particularly with the left hand, and she appeared upset that she could not do these movements.  Social Comportment: Appropriate Mood: "I just don't feel like I used to" Affect: Blunted Thought process/content: The patient's thought process was generally logical and goal directed although she did seem to have a hard time responding to questions pertinently and would sometimes get off track until brought back. Thought content was appropraite.  Safety: Patient denied any thoughts of harming self or others on direct questioning.  [facility]: [place] Exam 07/13/2019 01/13/2019  Orientation to time 4 4  Orientation to Place 5 5  Registration 3 3  Attention/ Calculation 0 2  Recall 1 1  Language- name 2 objects 2 2  Language- repeat 0 1  Language- follow 3 step command 2 3  Language- read & follow direction 1 1  Write a sentence 1 1  Copy design 0 0  Total score 19 23  MMSE Serial 7's: 19/30  Patient could not spell the word "world" forward.   Test Procedures  Wide Range Achievement Test - 4   Word Reading Wechsler Adult Intelligence Scale - IV  Digit Span  Arithmetic  Symbol Search  Coding Repeatable Battery for the Assessment of Neuropsychological Status (Form A) ACS Word Choice The Dot  Counting Test Controlled Oral Word Association (F-A-S) Semantic Fluency (Animals) Trail Making Test A & B Wisconsin Card Sorting Test [phone] Patient Health Questionnaire - 9  GAD-7  Plan  Christina Galvan was seen for a psychiatric diagnostic evaluation and neuropsychological testing. She has numerous confounds, as she is depressed, having some emotional lability, and has a lot of stress. Nevertheless her test data is concerning and the types of errors that she made suggest fairly profound visuospatial problems. Her family have noticed the changes although she is still functioning adequately with most things around the house. More changes have been noticed at work. She also presented as quite apraxic with an automatic/voluntary dissociation on the right. She was generally worse with the left than the right hand, which is non-lateralizing given that she has more volume loss on the left than the right (although the left parietal lobe is thought to typically be dominant for praxis bilaterally in most right-handed individuals and she is right-handed). Full and complete note with impressions, recommendations, and interpretation of test data to follow.   Viviano Simas Nicole Kindred, PsyD, [place] Clinical Neuropsychologist  Informed Consent and Coding/Compliance  Risks and benefits of the evaluation were discussed with the patient prior to all testing procedures. I conducted a clinical interview and neuropsychological testing (at least two tests) with Iver Nestle and Lamar Benes, B.S. (Technician) assisted me in administering additional test procedures. The patient was able to tolerate the testing procedures and the patient (and/or family if applicable) is likely to benefit from further follow up to receive the diagnosis and treatment recommendations, which will be rendered at the next encounter. Billing below reflects technician time, my direct face-to-face time with the patient, time spent in test  administration, and time spent in professional activities including but not limited to: neuropsychological test interpretation, integration of neuropsychological test data  with clinical history, report preparation, treatment planning, care coordination, and review of diagnostically pertinent medical history or studies.   Services associated with this encounter: Clinical Interview [phone]) plus 60 minutes [id number]; Neuropsychological Evaluation by Professional)  180 minutes [id number]; Neuropsychological Evaluation by Professional, Adl.) 30 minutes [id number]; Test Administration by Professional) 30 minutes [id number]; Neuropsychological Testing by Technician) 100 minutes [id number]; Neuropsychological Testing by Technician, Adl.)

## 2019-07-18 NOTE — Progress Notes (Signed)
[facility]  Patient Name: Christina Galvan MRN: [id number] Date of Birth: 04-Jul-1965 Age: 54 y.o. Education: 12 years  Measurement properties of test scores: IQ, Index, and Standard Scores (SS): Mean = 100; Standard Deviation = 15 Scaled Scores (Ss): Mean = 10; Standard Deviation = 3 Z scores (Z): Mean = 0; Standard Deviation = 1 T scores (T); Mean = 50; Standard Deviation = 10  TEST SCORES:    Note: This summary of test scores accompanies the interpretive report and should not be considered in isolation without reference to the appropriate sections in the text. Test scores are relative to age, gender, and educational history as available and appropriate.   Performance Validity        ACS: Raw Descriptor      Word Choice: 59 Below Expectation      Rey 15 and Recognition: Raw Descriptor      Free Recall 7 Below Expectation      Recognition 6 ---      False Positives 0 ---      Combined Score 13 Below Expectation      MSVT: Raw Descriptor      IR 90 Below Expectation      DR 90 Below Expectation      CNS 80 Below Expectation      PA 20 ---      FR 30 ---      The Dot Counting Test: Raw Descriptor      E-Score 30 Below Expectation      Embedded Measures: Raw Descriptor      RBANS Effort Index: 5 Below Expectation      WAIS-IV Reliable Digit Span: 6 Below Expectation      WAIS-IV Reliable Digit Span Revised 8 Below Expectation      Expected Functioning        Wide Range Achievement Test (Word Reading): Standard/Scaled Score Percentile       Word Reading 72 3      Cognitive Testing        RBANS, Form : Standard/Scaled Score Percentile  Total Score 51 <1  Immediate Memory 49 <1      List Learning 2 <1      Story Memory 2 <1  Visuospatial/Constructional 58 <1      Figure Copy   (10) 1 <1      Line Orientation --- <2  Language 82 12      Picture Naming --- 17-25      Semantic Fluency 4 2  Attention 56 <1      Digit Span  5 5      Coding 2 <1  Delayed Memory 44 <1      List Recall   (0) --- <2      List Recognition   (13) --- <2      Story Recall   (1) 1 <1      Figure Recall   (0) 1 <1      Wechsler Adult Intelligence Scale - IV: Standard/Scaled Score Percentile  Working Memory Index 66 1      Digit Span 4 2          Digit Span Forward 6 9          Digit Span Backward 7 16          Digit Span Sequencing 4 2      Arithmetic 4 2  Processing Speed Index 50 <1  Symbol Search 1 <1      Coding 1 <1      Verbal Fluency: T-score Percentile      Controlled Oral Word Association (F-A-S) 34 5      Semantic Fluency (Animals) 31 3      Trail Making Test: T-Score Percentile      Part A 12 <1      Part B 10 <1      Modified Wisconsin Card Sorting Test (MWCST): Standard/T-Score Percentile      Number of Categories Correct 19 1      Number of Perseverative Errors 23 1      Number of Total Errors 20 1      Percent Perseverative Errors 31 3  Executive Function Composite 54 1      Boston Diagnostic Aphasia Exam: Raw Score Scaled Score      Complex Ideational Material 8 3      Clock Drawing Raw Score Descriptor      Command 8 Borderline Impairment      Rating Scales         Raw Score Descriptor  Patient Health Questionnaire - 9 15 Severe Depression  GAD-7 13 Moderate Anxiety    Efe Fazzino V. Nicole Kindred PsyD, [place] Clinical Neuropsychologist

## 2019-07-20 ENCOUNTER — Other Ambulatory Visit: Payer: Self-pay

## 2019-07-20 ENCOUNTER — Encounter: Payer: Self-pay | Admitting: Counselor

## 2019-07-20 ENCOUNTER — Ambulatory Visit (INDEPENDENT_AMBULATORY_CARE_PROVIDER_SITE_OTHER): Payer: 59 | Admitting: Counselor

## 2019-07-20 DIAGNOSIS — F09 Unspecified mental disorder due to known physiological condition: Secondary | ICD-10-CM | POA: Diagnosis not present

## 2019-07-20 DIAGNOSIS — F418 Other specified anxiety disorders: Secondary | ICD-10-CM | POA: Diagnosis not present

## 2019-07-20 NOTE — Progress Notes (Signed)
[facility]  I met with Christina Galvan to review the findings resulting from Christina neuropsychological evaluation. Since the last appointment, things have been about the same. Time was spent reviewing the impressions and recommendations that are detailed in the evaluation report. I explained to Christina Galvan that Christina test findings are equivalent to what would be expected in the setting of a dementia level cognitive problem. At the same time, she failed multiple standalone and embedded performance validity indicators. I further explained that I would expect more in the way of observable real world problems were the test data valid and as such, the evaluation is inconclusive. Christina daughter stated that she does feel like the problem has gotten to a point where it needs further investigation and management although, "she doesn't always share everything she's going through with me," so it is hard to tell. She also said that the patient "may have been reported" at work by a concerned coworker and that coworkers are picking up a significant portion of Christina work although the patient didn't mention that during the initial evaluation. I explained that if Christina test data are valid, she should not be working in Christina current position. I also cautioned them to monitor driving safety. They will follow up with Dr. Tomi Likens in July and I encouraged them to be in touch with me or Dr. Tomi Likens in the meantime if they have further questions. Patient could also liaison with Christina PCP while this issue is being worked up re. Short term disability. Interventions provided during this encounter included psychoeducation, care coordination, and other topics as reflected in the patient instructions. I took time to explain the findings and answer all the patient's questions. I encouraged Christina Galvan to contact me should she have any further questions or if further follow up is desired.   Current Medications and  Medical History   Current Outpatient Medications  Medication Sig Dispense Refill  . cyanocobalamin (,VITAMIN B-12,) 1000 MCG/ML injection Inject 1000 mcg (1 mL) IM once weekly for 3 weeks and then once monthly 10 mL 1  . DULoxetine (CYMBALTA) 60 MG capsule Take 1 capsule (60 mg total) by mouth daily. 30 capsule 3  . famotidine (PEPCID) 20 MG tablet Take 2 tablets (40 mg total) by mouth daily. 180 tablet 1  . omeprazole (PRILOSEC) 20 MG capsule TAKE 1 CAPSULE BY MOUTH ONCE A DAY 30 capsule 2  . rosuvastatin (CRESTOR) 20 MG tablet TAKE 1 TABLET BY MOUTH ONCE DAILY 90 tablet 3  . SYRINGE-NEEDLE, DISP, 3 ML 25G X 1" 3 ML MISC Use once weekly with B12 injection for 3 weeks and then once monthly. 12 each 0  . trandolapril (MAVIK) 4 MG tablet TAKE 1 TABLET BY MOUTH ONCE A DAY (Patient not taking: Reported on 05/30/2019) 90 tablet 1   No current facility-administered medications for this visit.   Patient Active Problem List   Diagnosis Date Noted  . B12 deficiency 02/09/2019  . Confusion 01/13/2019  . HA (headache) 01/13/2019  . Vertigo 06/08/2018  . Unintentional weight loss 11/29/2016  . Hyperlipidemia 08/24/2016  . Hypokalemia 12/09/2015  . Exertional chest pain 11/30/2015  . Chronic back pain 10/31/2015  . Tobacco abuse 10/31/2015  . Essential hypertension 10/31/2015  . GERD (gastroesophageal reflux disease) 10/28/2014  . Migraines 10/28/2014  . Anxiety and depression 10/28/2014    Mental Status and Behavioral Observations  Christina Galvan presented to this telephonic appointment on time, and was attended by Christina Galvan.  She was alert and generally oriented (orientation not formally assessed). Christina speech was normal in rate, rhythm, and volume. There were no word finding pauses or paraphasic errors. Christina thought process seemed logical, linear, and goal oriented and she did not present as having any difficulties following the topics that were discussed. Thought content was  appropriate. There were no safety concerns identified, at this visit.   Plan  Feedback provided regarding the patient's neuropsychological evaluation. Christina Galvan either has a significant cognitive problem equivalent to a dementia level of problem or invalid performance and further workup would be reasonable. I will contact Dr. [name] to inform him, and he may wish to consider short term disability for Christina while Christina issues are further investigated. Christina Galvan was encouraged to contact me if any questions arise or if further follow up is desired.   Viviano Simas Nicole Kindred, PsyD, ABN Clinical Neuropsychologist  Service(s) Provided at This Encounter: 50 minutes [phone]; Conjoint therapy with patient present)

## 2019-07-20 NOTE — Progress Notes (Addendum)
[facility]  Patient Name: Christina Galvan MRN: [id number] Date of Birth: 02-24-1966 Age: 54 y.o. Education: 12 years  Clinical Impressions  Christina Galvan is a 54 y.o., right-hand dominant, single woman with a history of low back pain, anxiety, depression, and migraine headaches. She has memory problems that have begun to affect her work (works as a Copy at [employer]) over about the past year. She gave examples of only minor problems, such as putting the wrong patient record in a file and thinking she is at the right nurses station when she is not. I was able to obtain collateral history from her daughter who did not present as very concerned but stated she has noticed some problems: the patient can't keep track of her schedule (despite the fact that it does not change that m,uch), and she also said that individuals at work have started to do the patient's work for her because she is having a hard time. She has an MRI that to my eye shows a subtle impression of more atrophy on the left than the right showing in the lateral ventricles and cortex but it is not clearly pathological in amount.   Ms. Christina Galvan neuropsychological test performance was globally and significantly impaired. She demonstrated profound constructional and visuospatial problems, difficulties with acquisition and retention of information across time, and low scores across the board in most other areas. This is with the caveat that she failed numerous standalone and embedded validity measures. 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 She screened in the severe range for depressives symptoms and the moderate range for anxiety symptoms. Psychiatric  symptoms alone are insufficient to explain her test performance in my opinion.   Ms. Christina Galvan performance is either invalid or she has a very substantial, dementia level problem. Despite her young age, I favor the latter given qualitative features. If her presentation on testing is credible, the differential would include early onset degenerative conditions causing primary posterior cortical dysfunction including early onset Alzheimer's disease and corticobasal syndrome/degeneration. No signs and symptoms to suggest Lewy Body Disease.   Diagnostic Impressions: Cognitive Disorder Depression with anxiety   Recommendations to be discussed with patient  Your performance and presentation on assessment is inconclusive. You demonstrated very significant levels of impairment in most areas, equivalent to what I would expect in the setting of a dementia condition, such as Alzheimer's disease. You also failed several performance validity indicators. Performance validity failure can and does certainly occur in the setting of very significant cognitive impairment although I would expect you to be having more in the way of day-to-day problems than you reported. It is possible that you are not noticing all your problems or that you are compensating or that others around you are compensating. As such, I am uncertain what is the cause of your problems.   One thought is to obtain further workup; there are medical studies that would not be affected by performance validity problems that could serve as a "tie breaker" and give [place] a better sense of the type of problem you have. Dr. Tomi Likens is currently in the process of working up your symptoms and I defer to him entirely as to whether any further workup is appropriate.   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   If your  difficulties are due to early onset dementia, you and your ongoing care providers will need to carefully monitor your driving abilities and consider driving cessation entirely. Neuropsychological testing cannot predict driving other than modestly, although if valid, the test data you produced are concerning for your ability to process visuospatial information.  Regardless of the cause of your cognitive problems, you are clearly having a hard time affectively. I would recommend that you work with Dr. [name] on medication for mood. Once we have a better sense of the cause of your cognitive problems, that would inform a referral for psychotherapy. If your difficulties are not due to performance validity problems, then I think it is unlikely that therapy would be helpful for you. If your difficulties are due to validity problems then counseling could potentially be of benefit.    Test Findings  Test scores are summarized in additional documentation associated with this encounter. Test scores are relative to age, gender, and educational history as available and appropriate. Ms. Christina Galvan performed below the level of expectations on 4 standalone and 3 embedded validity measures. While these findings would frankly invalidate her test data and render them unreliable as indicators of her cognitive function, these low scores could also be due to a dementia level problem, and as such the validity of her test data is not entirely clear.   General Intellectual Functioning/Achievement:  Performance on single word reading fell at an unusually low level, which suggests a longstanding history of limited literacy and tempers expectations for her test findings to some extent.   Attention and Processing Efficiency: Performance on indicators of attention and processing efficiency was impaired with an extremely low score on the Working Memory Index of the WAIS-IV. At a subtest level, she performed at an unusually low level  on two different measures of digit repetition forward, a low average level on digit repetition backward, and an extremely low level on digit resequencing in ascending order and mental solving of arithmetical word problems without paper and pencil.   Ms. Deveaux appeared to have difficulties interacting with visual test forms, which likely undermined her performance on measures of processing efficiency. She achieved extremely low scores across the board on two different measures of timed number-symbol coding and on a measure of efficient visual scanning.   Language: Performance was relatively preserved on measures of language, despite the fact that she still generated mostly low scores. Visual object confrontation naming was normal. Sensitive fluency indicators fell at an unusually low level for semantic and phonemic fluency.   Visuospatial Function: Performance was profoundly and significantly impaired on measures of visuospatial abilities. Specifically, copy of a modestly complex line drawing was extremely low. She drew the figure in a piecemeal fashion and had poor planning. She was not able to copy the overlapping pentagons of the MMSE and her rendition was once again drawn in a piecemeal, stimulus bound fashion with poor planning. She was unable to improve her performance even when given a second opportunity. Judgment of angular line orientations was extremely low.   Learning and Memory: Performance on measures of learning and memory was notably and significantly impaired. Ms. Souffrant demonstrated extremely low scores for immediate and delayed recall and recognition of verbal information including a word list and short story and for visual information.   Executive Functions: Performance on executive indicators was low, with  extremely low performance on the Executive Function Composite of the Modified [employer], unusually low phonemic fluency, and extremely low reasoning with  verbal information on the Complex Ideational Material. She was unable to complete timed alternating sequencing of numbers and letters making it about half way through in the time limit, generating an extremely low score.   Rating Scale(s): Ms. Garretson reported severe levels of depressive symptoms and moderate levels of anxiety symptoms. She did not have an informant to provide severity status staging.   Viviano Simas Nicole Kindred PsyD, [place] Clinical Neuropsychologist

## 2019-07-21 ENCOUNTER — Telehealth: Payer: Self-pay | Admitting: Family Medicine

## 2019-07-21 NOTE — Telephone Encounter (Signed)
Opened in Error.

## 2019-07-21 NOTE — Telephone Encounter (Signed)
Please contact the patient. I received a message from the psychologist that did her recent evaluation. They noted that she may need short term disability while her memory issues are being worked up. I would like for her to come in for a visit this coming week to discuss this.

## 2019-07-21 NOTE — Telephone Encounter (Signed)
-----   Message from Alphonzo Severance sent at 07/20/2019  3:19 PM EDT ----- Dr. [name] - I wanted to Cc you on this follow up with Ms. Caprice Beaver. She was referred to me for neuropsychological evaluation and is following with Dr. Tomi Likens. Either her test data are not valid (i.e., effort problems) or she has an early onset neurodegenerative condition (most likely AD or corticobasal syndrome/degeneration). Dr. Tomi Likens is aware and she is following up with him in July but I wanted to loop you in. She reports no safety issues with driving and initially stated that she was having only minor issues at work, which is surprising if her test scores are valid. Her daughter then told me during the follow up that "maybe someone had reported her" and that her coworkers are picking up a lot of her work, so it's not entirely clear. If her data are valid they would support an application for disability status. Cc'ing Dr. Tomi Likens as well.

## 2019-07-21 NOTE — Telephone Encounter (Signed)
Patient has been notified and appointment has been scheduled. 

## 2019-07-25 ENCOUNTER — Other Ambulatory Visit: Payer: Self-pay

## 2019-07-25 ENCOUNTER — Ambulatory Visit: Payer: 59 | Admitting: Family Medicine

## 2019-07-25 ENCOUNTER — Encounter: Payer: Self-pay | Admitting: Family Medicine

## 2019-07-25 DIAGNOSIS — R413 Other amnesia: Secondary | ICD-10-CM | POA: Diagnosis not present

## 2019-07-25 DIAGNOSIS — F32A Depression, unspecified: Secondary | ICD-10-CM

## 2019-07-25 DIAGNOSIS — F329 Major depressive disorder, single episode, unspecified: Secondary | ICD-10-CM | POA: Diagnosis not present

## 2019-07-25 DIAGNOSIS — F419 Anxiety disorder, unspecified: Secondary | ICD-10-CM

## 2019-07-25 NOTE — Progress Notes (Signed)
Christina Rumps, MD Phone: [phone]  Christina Galvan is a 54 y.o. female who presents today for follow-up.  Memory difficulty/confusion: Patient underwent evaluation with neurology and neuropsychology.  Neuropsychology testing is concerning for early onset dementia though the patient did have failing scores on performance validity indicators.  Her test performance was globally and significantly impaired.  They report a profound constructional and visual spatial problems, difficulties with acquisition and retention of information across time and low scores across the board and most other areas.  The neuropsychologist felt as though psychiatric symptoms alone were insufficient to explain her test performance.  They noted that her performance is either invalid or she has a very substantial dementia level problem and noted they favored the latter given qualitative features.  They suggested that she follow-up with neurology.  They also suggested short-term disability while this issue is being worked up.  The patient continues to have issues with her memory.  She notes she did everything that was requested of her for the testing.  She has trouble remembering what she is doing.  She cannot remember where she put things.  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  She has significant trouble managing charts and incoming calls.  She notes other people at her work have been picking up the slack for her.  She does not manage or administer any medications.  She does have some patient interaction with taking vitals.  She does note getting lost while driving.  No issues with car accidents.  She does continue to have some anxiety issues.  Increased dose of Cymbalta helped significantly.  She does note some more panic attacks recently.  No SI.  Social History   Tobacco Use  Smoking Status Current Some Day Smoker  . Packs/day: 0.05  . Years: 30.00  . Pack years: 1.50  . Types: Cigarettes    . Start date: 10/21/1984  Smokeless Tobacco Never Used     ROS see history of present illness  Objective  Physical Exam Vitals:   07/25/19 1133  BP: 125/70  Pulse: 95  Temp: (!) 97.3 F (36.3 C)  SpO2: 99%    BP Readings from Last 3 Encounters:  07/25/19 125/70  05/30/19 122/76  01/13/19 140/80   Wt Readings from Last 3 Encounters:  07/25/19 155 lb 6.4 oz (70.5 kg)  05/30/19 160 lb (72.6 kg)  02/08/19 145 lb (65.8 kg)    Physical Exam Constitutional:      General: She is not in acute distress.    Appearance: She is not diaphoretic.  Cardiovascular:     Rate and Rhythm: Normal rate and regular rhythm.     Heart sounds: Normal heart sounds.  Pulmonary:     Effort: Pulmonary effort is normal.     Breath sounds: Normal breath sounds.  Skin:    General: Skin is warm and dry.  Neurological:     Mental Status: She is alert.     Comments: Patient does have quite a bit of trouble focusing during our conversation and has trouble coming up with answers to certain questions.  5/5 strength in bilateral biceps, triceps, grip, quads, hamstrings, plantar and dorsiflexion, sensation to light touch intact in bilateral UE and LE, normal gait, 2+ patellar reflexes      Assessment/Plan: Please see individual problem list.  Memory difficulty This continues to be an issue.  Her recent neuropsychological testing revealed significant issues and concerns though she did fail on the  performance validity indicators and the test.  The psychologist noted that that could be seen with significant dementia issues.  The patient has had significant trouble completing her job and has had others take up her slack.  She has also been getting lost at times while driving.  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  I gave her a work note.  She will check with human resources and get a  short-term disability paperwork.  I discussed that if she needed long-term disability I would defer that to her neurology specialist as I typically do not do long-term disability.  I have advised her not to drive at this time.  She notes she can have her sister drive her places.  Anxiety and depression Does still have some anxiety.  Has improved some with the increased dose of Cymbalta.  She will continue this.   No orders of the defined types were placed in this encounter.   No orders of the defined types were placed in this encounter.   This visit occurred during the SARS-CoV-2 public health emergency.  Safety protocols were in place, including screening questions prior to the visit, additional usage of staff PPE, and extensive cleaning of exam room while observing appropriate contact time as indicated for disinfecting solutions.    Christina Rumps, MD [place]

## 2019-07-25 NOTE — Progress Notes (Signed)
I called [facility] and the patient was put on a waiting list because his next available appt is in September, but the receptionist stated that if you could fax  your reasoning or note for why it should be moved up from July he will review. The fax to send it to is [phone] for Dr. Tomi Likens.  Dahlia Nifong,cma

## 2019-07-25 NOTE — Patient Instructions (Signed)
Nice to see you. I provided you with an out of work note.  I do not feel that you should be at work at this time after your recent cognitive testing and recommendations from the psychologist. Please contact human resources to determine if you have a short-term disability policy and ask that they fax [place] paperwork regarding this if you do have such a policy. Please do not drive.

## 2019-07-25 NOTE — Assessment & Plan Note (Signed)
Does still have some anxiety.  Has improved some with the increased dose of Cymbalta.  She will continue this.

## 2019-07-25 NOTE — Assessment & Plan Note (Signed)
This continues to be an issue.  Her recent neuropsychological testing revealed significant issues and concerns though she did fail on the performance validity indicators and the test.  The psychologist noted that that could be seen with significant dementia issues.  The patient has had significant trouble completing her job and has had others take up her slack.  She has also been getting lost at times while driving.  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  I gave her a work note.  She will check with human resources and get a short-term disability paperwork.  I discussed that if she needed long-term disability I would defer that to her neurology specialist as I typically do not do long-term disability.  I have advised her not to drive at this time.  She notes she can have her sister drive her places.

## 2019-07-26 NOTE — Progress Notes (Signed)
Please fax this note to Dr Tomi Likens as outlined below. The patient had recent neuropsych testing revealing possible significant dementia issues though she did fail several validity indicators. She is having significant issues with her job and has been getting lost while driving. I think she needs to see Dr Tomi Likens sooner to help complete her evaluation for a possible cause of her memory issues given the significance of her recent testing. I have advised her not to drive and have written her out of work and encouraged her to contact HR for the possibility of short term disability as suggested by the neuropsychologist.

## 2019-07-27 ENCOUNTER — Telehealth: Payer: Self-pay

## 2019-07-27 NOTE — Telephone Encounter (Signed)
-----   Message from Leone Haven, MD sent at 07/26/2019  3:26 PM EDT ----- Please fax the entire note to Dr Tomi Likens. Please include my addendum.

## 2019-07-27 NOTE — Telephone Encounter (Signed)
Notes for this patient was faxed to Dr. Tomi Likens in order to get her a sooner appt. Per the provider.  Christina Galvan,cma

## 2019-07-28 ENCOUNTER — Other Ambulatory Visit: Payer: Self-pay

## 2019-07-28 ENCOUNTER — Telehealth: Payer: Self-pay | Admitting: Family Medicine

## 2019-07-28 DIAGNOSIS — R413 Other amnesia: Secondary | ICD-10-CM

## 2019-07-28 NOTE — Telephone Encounter (Signed)
I called the patient and informed her to reach out to Dr. Tomi Likens office for further evaluation of her memory. She understood and stated she would call them. Shenekia Riess,cma

## 2019-07-28 NOTE — Telephone Encounter (Signed)
Please let the patient know that Dr Georgie Chard office has tried to reach out to her to set up the next steps in her evaluation for her memory and confusion issues. She needs to call his office to follow-up on this.

## 2019-08-03 ENCOUNTER — Telehealth: Payer: Self-pay

## 2019-08-03 ENCOUNTER — Telehealth: Payer: Self-pay | Admitting: Family Medicine

## 2019-08-03 NOTE — Telephone Encounter (Signed)
I left message at [facility] for metabolic. To call office back if anything else needed.

## 2019-08-03 NOTE — Telephone Encounter (Signed)
I called the [employer] about the form and they have received 31 pages and the confirmation was for 35 pages, I will hold the form and information until tomorrow to make sure all documents have been received.  Christina Galvan,cma

## 2019-08-03 NOTE — Telephone Encounter (Signed)
[employer] called stating that all insurance forms were not faxed.They only received one sheet of the patient's forms. Fax additional form to [phone]

## 2019-08-03 NOTE — Telephone Encounter (Signed)
Telephone call from [facility], Per DR. Jaffe not filling out paperwork until pt has further testing done to support the reason for FMLA.

## 2019-08-03 NOTE — Telephone Encounter (Signed)
-----   Message from Venetia Night, [place] sent at 08/03/2019  2:50 PM EDT ----- Can you call this providers ----- Message ----- From: Pieter Partridge, DO Sent: 08/03/2019   2:36 PM EDT To: Venetia Night, CMA  Metabolic ----- Message ----- From: Venetia Night, CMA Sent: 08/03/2019  11:53 AM EDT To: Pieter Partridge, DO  Nada Boozer, we spoke earlier about Ms. Rohrman. I spoke with our Clinical Imaging Supervisor and we just wanted to double check that Dr. Tomi Likens wants the amyloid brain instead of a metabolic brain. If he could please reach out to our Nuclear Radiologist Dr. Leonia Reeves at [phone] that would be great. Thank you.  From [place] in scheduling.

## 2019-08-04 ENCOUNTER — Telehealth: Payer: Self-pay

## 2019-08-04 ENCOUNTER — Other Ambulatory Visit: Payer: Self-pay | Admitting: Family Medicine

## 2019-08-04 MED FILL — DULOXETINE HCL 60 MG CPEP: 60 | 30 days supply | Qty: 30 | Fill #2

## 2019-08-04 MED FILL — CYANOCOBALAMIN 1,000 MCG/ML: 1000 | 84 days supply | Qty: 3 | Fill #2

## 2019-08-04 MED FILL — TRANDOLAPRIL 4 MG TABLET: 4 | 90 days supply | Qty: 90 | Fill #1

## 2019-08-04 NOTE — Telephone Encounter (Signed)
New Order: From  Lovena Le in scheduling  I changed the order and sent it back for Dr. Tomi Likens to sign. feel free to reach out with any other questions. have a great friday!

## 2019-08-09 ENCOUNTER — Telehealth: Payer: Self-pay

## 2019-08-09 NOTE — Telephone Encounter (Signed)
Completed   Please fax back

## 2019-08-09 NOTE — Telephone Encounter (Signed)
Form was fax to [employer], confirmation given.  Ayan Heffington,cma

## 2019-08-13 ENCOUNTER — Encounter: Payer: Self-pay | Admitting: Family Medicine

## 2019-08-14 NOTE — Telephone Encounter (Signed)
I can see her later this week to discuss further. Neurology was planning on working her up further with a PET scan and this has been ordered. Has she been able to schedule this? The neurologist previously sent me a message and he did not feel comfortable having her out on disability at this time as there is not a formal diagnosis and she needed to complete further work up first. My previous decision to take her out of work was based on the recommendation of the neuropsychologist who noted that we should consider short term disability while the work up was being completed given the possible diagnosis with her recent testing, though given the neurologists previous recommendation I could potentially release her back to work at this time. I would like to see her this week to discuss and then could likely release her back to work until she completes the work up through neurology. She needs to make sure she completes the work up through neurology.

## 2019-08-16 ENCOUNTER — Ambulatory Visit: Payer: 59 | Admitting: Family Medicine

## 2019-08-22 ENCOUNTER — Ambulatory Visit (HOSPITAL_COMMUNITY)
Admission: RE | Admit: 2019-08-22 | Discharge: 2019-08-22 | Disposition: A | Discharge status: Home / Self Care | Payer: 59 | Source: Ambulatory Visit | Attending: Neurology | Admitting: Neurology

## 2019-08-22 ENCOUNTER — Other Ambulatory Visit: Payer: Self-pay

## 2019-08-22 DIAGNOSIS — R413 Other amnesia: Secondary | ICD-10-CM | POA: Diagnosis not present

## 2019-08-22 DIAGNOSIS — G309 Alzheimer's disease, unspecified: Secondary | ICD-10-CM | POA: Diagnosis not present

## 2019-08-22 LAB — GLUCOSE, CAPILLARY: Glucose-Capillary: 91 mg/dL (ref 70–99)

## 2019-08-22 MED ORDER — FLUDEOXYGLUCOSE F - 18 (FDG) INJECTION
10.3000 | Freq: Once | INTRAVENOUS | Status: AC
  Administered 2019-08-22: 10.3 via INTRAVENOUS

## 2019-08-25 ENCOUNTER — Telehealth: Payer: Self-pay | Admitting: Family Medicine

## 2019-08-25 NOTE — Telephone Encounter (Signed)
I called the patient and informed her that I did not call her.  She understood. Anyiah [place]dale,cma

## 2019-08-25 NOTE — Telephone Encounter (Signed)
Pt called was returning call

## 2019-08-25 NOTE — Telephone Encounter (Signed)
I called and spoke with the patient today as I noted her metabolic PET scan came back of her brain.  She noted she had seen the results on my chart.  Discussed the results and that the pattern of results could be seen in Alzheimer's disease pathology.  I discussed that the neurologist would be the one to make the determination on what exactly this means and what happens moving forward.  I asked if she had any questions and she noted she did not.  She notes she has been out of work since I removed her from work and notes that the only way she can go back is to be completely cleared and to not have dementia.  I discussed that the neurologist would be the one to make a determination on this at her follow-up with them next week.  Given that she is seeing them next week we ended up canceling her visit with me.

## 2019-08-29 NOTE — Progress Notes (Signed)
Virtual Visit via Video Note The purpose of this virtual visit is to provide medical care while limiting exposure to the novel coronavirus.    Consent was obtained for video visit:  Yes.   Answered questions that patient had about telehealth interaction:  Yes.   I discussed the limitations, risks, security and privacy concerns of performing an evaluation and management service by telemedicine. I also discussed with the patient that there may be a patient responsible charge related to this service. The patient expressed understanding and agreed to proceed.  Pt location: Home Physician Location: office Name of referring provider:  Leone Haven, MD I connected with Christina Galvan at patients initiation/request on 08/30/2019 at 11:40 AM EDT by video enabled telemedicine application and verified that I am speaking with the correct person using two identifiers. Pt MRN:  [id number] Pt DOB:  Aug 24, 1965 Video Participants:  Christina Galvan;  Her daughter   History of Present Illness:  Christina Galvan is a 54 year old right-handed female with HTN, chronic low back pain, migraines, depression, anxiety and history of kidney stones who follows up for cognitive deficits.  UPDATE: Repeat B12 level from 05/30/2019 was 301.  Advised to continue injections.  She underwent neuropsychological testing on 07/13/2019, which demonstrated global and significant impairment which suggests her performance was invalid or still a substantial dementia such as early-onset Alzheimer's disease and corticobasal syndrome but did not correlate with Lewy Body Disease.  For further evaluation, she underwent PET scan of brain on 08/22/2019, which demonstrated decreased cortical metabolism in the biparietal lobes and occipital lobes.  HISTORY: Over the past year, she has had several medication changes for her anxiety and episodes of confusion.  She was switched from buspirone to sertraline.  A few months ago, she was  switched from sertraline to Cymbalta 30mg  in attempt to better treat anxiety and sciatic pain.  She reports worsening symptoms since then.  She describes episodes where she feels like she is in a fog, feeling more angry and sometimes has hallucinations of people walking.  It is typically elicited when she is upset about something.  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  She reports that she sometimes files papers in the wrong chart or will sometimes forget names.  For the past 3 months, she also reports a persistent dull non-throbbing bifrontal headache.  No associated nausea, vomiting, visual disturbance, numbness or weakness.  She had been taking Goody powder daily.  Around the same time that her symptoms worsened, she reported increased emotional stress.  She had to financially support her son who was in legal trouble, which caused her to lose her home.  She had to move in with her mother.  She also has chronic low back pain with sciatic pain.  She previously took tramadol daily which was discontinued.  Labs from November 2020 showed TSH 0.63 but also low B12 of 170.  She was started on B12 injections but reports no improvement.  MRI of brain without contrast from 02/07/2019 showed mild chronic small vessel ischemic changes but overall unremarkable.  Eye exam overall unremarkable.  She got new glasses but no difference in headaches.  Current NSAIDS:  none Current analgesics:  Goodys Current triptans:  none Current ergotamine:  none Current anti-emetic:  none Current muscle relaxants:  none Current anti-anxiolytic:  none Current sleep aide:  none Current Antihypertensive medications:  none Current Antidepressant medications:  Cymbalta 30mg   daily Current Anticonvulsant medications:  none Current anti-CGRP:  none Current Vitamins/Herbal/Supplements:  B12 1069mcg injections monthly Current Antihistamines/Decongestants:  none Other  therapy:  none Hormone/birth control:  none  Past NSAIDS:  Ibuprofen, naproxen Past analgesics:  tramadol Past abortive triptans:  none Past abortive ergotamine:  none Past muscle relaxants:  none Past anti-emetic:  none Past antihypertensive medications:  none Past antidepressant medications:  Sertraline 200mg  daily Past anticonvulsant medications:  none Past anti-CGRP:  none   Past history:  No history of seizures, head trauma  Past Medical History: Past Medical History:  Diagnosis Date  . Anxiety   . Anxiety and depression   . Depression with anxiety   . GERD (gastroesophageal reflux disease)   . History of frequent urinary tract infections   . Hypertension   . Kidney stones   . Migraines     Medications: Outpatient Encounter Medications as of 08/30/2019  Medication Sig  . cyanocobalamin (,VITAMIN B-12,) 1000 MCG/ML injection Inject 1000 mcg (1 mL) IM once weekly for 3 weeks and then once monthly  . DULoxetine (CYMBALTA) 60 MG capsule Take 1 capsule (60 mg total) by mouth daily.  . famotidine (PEPCID) 20 MG tablet Take 2 tablets (40 mg total) by mouth daily.  Marland Kitchen omeprazole (PRILOSEC) 20 MG capsule TAKE 1 CAPSULE BY MOUTH ONCE A DAY  . rosuvastatin (CRESTOR) 20 MG tablet TAKE 1 TABLET BY MOUTH ONCE DAILY  . SYRINGE-NEEDLE, DISP, 3 ML (B-D 3CC LUER-LOK SYR 25GX1") 25G X 1" 3 ML MISC USE ONCE WEEKLY WITH B12 INJECTION FOR 3 WEEKS, THEN ONCE MONTHLY.  . trandolapril (MAVIK) 4 MG tablet TAKE 1 TABLET BY MOUTH ONCE A DAY   No facility-administered encounter medications on file as of 08/30/2019.    Allergies: Allergies  Allergen Reactions  . Thiazide-Type Diuretics     Hypokalemia, Hyponatremia, Chest pain  . Mushroom Extract Complex   . Sulfa Antibiotics     Family History: Family History  Problem Relation Age of Onset  . Arthritis Mother   . Heart disease Mother   . Hypertension Mother   . Diabetes Mother   . Arrhythmia Mother        A-Fib   . Cancer Father         Lung Cancer  . Diabetes Brother   . Asthma Daughter   . Cancer Paternal Uncle        Prostate cancer  . Heart disease Maternal Grandfather   . Heart disease Paternal Grandfather     Social History: Social History   Socioeconomic History  . Marital status: Single    Spouse name: Not on file  . Number of children: Not on file  . Years of education: Not on file  . Highest education level: Not on file  Occupational History  . Not on file  Tobacco Use  . Smoking status: Current Some Day Smoker    Packs/day: 0.05    Years: 30.00    Pack years: 1.50    Types: Cigarettes    Start date: 10/21/1984  . Smokeless tobacco: Never Used  Vaping Use  . Vaping Use: Never used  Substance and Sexual Activity  . Alcohol use: Not Currently    Alcohol/week: 0.0 standard drinks    Comment: Rare  . Drug use: No  . Sexual activity: Not Currently    Partners: Male    Birth control/protection: I.U.D.  Other Topics Concern  . Not on file  Social History Narrative   Works at [employer]    Lives with son and daughter   Pets: None   Caffeine- 1 20 oz bottle of soda       Right handed   One story    Social Determinants of Health   Financial Resource Strain:   . Difficulty of Paying Living Expenses:   Food Insecurity:   . Worried About Charity fundraiser in the Last Year:   . Arboriculturist in the Last Year:   Transportation Needs:   . Film/video editor (Medical):   Marland Kitchen Lack of Transportation (Non-Medical):   Physical Activity:   . Days of Exercise per Week:   . Minutes of Exercise per Session:   Stress:   . Feeling of Stress :   Social Connections:   . Frequency of Communication with Friends and Family:   . Frequency of Social Gatherings with Friends and Family:   . Attends Religious Services:   . Active Member of Clubs or Organizations:   . Attends Archivist Meetings:   Marland Kitchen Marital Status:   Intimate Partner Violence:   . Fear of Current or Ex-Partner:     . Emotionally Abused:   Marland Kitchen Physically Abused:   . Sexually Abused:     Observations/Objective:   There were no vitals taken for this visit..  Assessment and Plan:   1.  Major neurocognitive disorder, likely Alzheimer's dementia with posterior cortical atrophy.  1.  We will initiate donepezil 5mg  at bedtime for 30 days, followed by 10mg  at bedtime 2.  I explained that I do not think she is able to work and recommended applying for long-term disability, which I would complete. 3.  I also advised that she should no longer drive.   4.  Family should monitor medication management and finances 5.  Advised to establish a healthcare power of attorney 6.  Will mail information with resources for information and assistance regarding the disease. 7.  Encouraged to remain physically and socially active and to participate in mentally stimulating activities. 8.  Follow up in 6 months.  Follow Up Instructions:    -I discussed the assessment and treatment plan with the patient to the best of my ability. The patient was provided an opportunity to ask questions and all were answered. The patient agreed with the plan and demonstrated an understanding of the instructions.   The patient was advised to call back or seek an in-person evaluation if the symptoms worsen or if the condition fails to improve as anticipated.    Dudley Major, DO

## 2019-08-30 ENCOUNTER — Telehealth: Payer: Self-pay

## 2019-08-30 ENCOUNTER — Encounter: Payer: Self-pay | Admitting: Neurology

## 2019-08-30 ENCOUNTER — Telehealth (INDEPENDENT_AMBULATORY_CARE_PROVIDER_SITE_OTHER): Payer: 59 | Admitting: Neurology

## 2019-08-30 ENCOUNTER — Ambulatory Visit: Payer: 59 | Admitting: Family Medicine

## 2019-08-30 ENCOUNTER — Other Ambulatory Visit: Payer: Self-pay

## 2019-08-30 DIAGNOSIS — G309 Alzheimer's disease, unspecified: Secondary | ICD-10-CM

## 2019-08-30 DIAGNOSIS — F0280 Dementia in other diseases classified elsewhere without behavioral disturbance: Secondary | ICD-10-CM

## 2019-08-30 MED ORDER — DONEPEZIL HCL 5 MG PO TABS: 5.0000 mg | ORAL_TABLET | Freq: Every day | ORAL | 0 refills | Status: DC

## 2019-08-30 MED FILL — DONEPEZIL HCL 5 MG TABLET: 5 | 30 days supply | Qty: 30 | Fill #0

## 2019-08-30 NOTE — Patient Instructions (Signed)
1.  We will start donepezil (Aricept) 5mg  daily for four weeks.  If you are tolerating the medication, then after four weeks, we will increase the dose to 10mg  daily.  Side effects include nausea, vomiting, diarrhea, vivid dreams, and muscle cramps.  Please call the clinic if you experience any of these symptoms. 2.  I recommend to apply for long-term disability.  Please have [facility] send me any forms required to be completed in regards to not being able to work. 3.  I will mail you information with different resources.  I also refer you to the Alzheimer's Association website, LDLive.be  RESOURCES: Senior Resources [facility]: [phone]  Tel [place]:  [phone]  www.senior-resources-[place].org/resources.cfm   Resources for common questions found under "Pathways & Protocols "  www.senior-resources-[place].org/pathways/Pathways_Menu.htm   Alzheimer's Association in [facility]: [address], [place], [place] [address] Tel [phone]  Resources for [place] and Assisted Living facilities:  www.ncnursinghomeguide.com   For assistance with senior care, elder law, and estate planning (POA, medical directives):  [employer]  [address], [place] [address]  Tel: [phone]  www.[email or web address]   Berneice Heinrich  Tel: [phone]  [email or web address].[email or web address]     Alzheimer's Disease Alzheimer's disease is a brain disease that affects memory, thinking, language, and behavior. People with Alzheimer's disease lose mental abilities, and the disease gets worse over time. Alzheimer's disease is a form of dementia. What are the causes? This condition develops when a protein called beta-amyloid forms deposits in the brain. It is not known what causes these deposits to form. Alzheimer's disease may also be caused by a gene mutation that is inherited from one parent or both parents. A gene mutation is a harmful change in a gene. Not everyone  who inherits the genetic mutation will get the disease. What increases the risk? You are more likely to develop this condition if you:  Are older than age 40.  Have a family history of dementia.  Have had a brain injury.  Have heart or blood vessel disease.  Have had a stroke.  Have high blood pressure or high cholesterol.  Have diabetes. What are the signs or symptoms? Symptoms of this condition may happen in three stages, which often overlap. Early stage In this stage, you may continue to be independent. You may still be able to drive, work, and be social. Symptoms in this stage include:  Minor memory problems, such as forgetting a name or what you read.  Difficulty with: ? Paying attention. ? Communicating. ? Doing familiar tasks. ? Problem solving or doing calculations. ? Following instructions. ? Learning new things.  Anxiety.  Social withdrawal.  Loss of motivation. Moderate stage In this stage, you will start to need care. Symptoms in this stage include:  Difficulty with expressing thoughts.  Memory loss that affects daily life. This can include forgetting: ? Your address or phone number. ? Recent events that have happened. ? Parts of your personal history, such as where you went to school.  Confusion about where you are or what time it is.  Difficulty in judging distance.  Changes in personality, mood, and behavior. You may be moody, irritable, angry, frustrated, fearful, anxious, or suspicious.  Poor reasoning and judgment.  Delusions or hallucinations.  Changes in sleep patterns.  Wandering and getting lost, even in familiar places. Severe stage In the final stage, you will need help with your personal care and daily activities. Symptoms in this  stage include:  Worsening memory loss.  Personality changes.  Loss of awareness of your surroundings.  Changes in physical abilities, including the ability to walk, sit, and swallow.  Difficulty  in communicating.  Inability to control your bladder and bowels.  Increasing confusion.  Increasing behavior changes. How is this diagnosed? This condition is diagnosed by a health care provider who specializes in diseases of the nervous system (neurologist). Other causes of dementia may also be ruled out. Your health care provider will talk with you and your family, friends, or caregivers about your history and symptoms. A thorough medical history will be taken, and you will have a physical exam and tests. Tests may include:  Lab tests, such as blood or urine tests.  Imaging tests, such as a CT scan, a PET scan, or an MRI.  A lumbar puncture. This test involves removing and testing a small amount of the fluid that surrounds the brain and spinal cord.  An electroencephalogram (EEG). In this test, small metal discs are used to measure electrical activity in the brain.  Memory tests, cognitive tests, and neuropsychological tests. These tests evaluate brain function.  Genetic testing may be done if you have early onset of the disease (before age 82) or if other family members have the disease. How is this treated? At this time, there is no treatment to cure Alzheimer's disease or stop it from getting worse. The goals of treatment are:  To slow down symptoms of the disease, if possible.  To manage behavioral changes.  To provide you with a safe environment.  To help manage daily life for you and your caregivers. The following treatment options are available:  Medicines. Medicines may help to slow down memory loss and manage behavioral symptoms.  Cognitive therapy. Cognitive therapy provides you with education, support, and memory aids. It is most helpful in the early stages of the condition.  Counseling or spiritual guidance. It is normal to have a lot of feelings, including anger, relief, fear, and isolation. Counseling and guidance can help you deal with these  feelings.  Caregiving. This involves having caregivers help you with your daily activities.  Family support groups. These provide education, emotional support, and information about community resources to family members who are taking care of you. Follow these instructions at home:  Medicines  Take over-the-counter and prescription medicines only as told by your health care provider.  Use a pill organizer or pill reminder to help you manage your medicines.  Avoid taking medicines that can affect thinking, such as pain medicines or sleeping medicines. Lifestyle  Make healthy lifestyle choices: ? Be physically active as told by your health care provider. Regular exercise may help improve symptoms. ? Do not use any products that contain nicotine or tobacco, such as cigarettes, e-cigarettes, and chewing tobacco. If you need help quitting, ask your health care provider. ? Do not drink alcohol. ? Eat a healthy diet. ? Practice stress-management techniques when you get stressed. ? Stay social.  Drink enough fluid to keep your urine pale yellow.  Make sure to get quality sleep. ? Avoid taking long naps during the day. Take short naps of 30 minutes or less if needed. ? Keep your sleeping area dark and cool. ? Avoid exercising during the few hours before you go to bed. ? Avoid caffeine products in the afternoon and evening. General instructions  Work with your health care provider to determine what you need help with and what your safety needs are.  If you were given a bracelet that identifies you as a person with memory loss or tracks your location, make sure to wear it at all times.  Talk with your health care provider about whether it is safe for you to drive.  Work with your family to make important decisions, such as advance directives, medical power of attorney, or a living will.  Keep all follow-up visits as told by your health care provider. This is important. Where to find  more information  The Alzheimer's Association: Call the 24-hour helpline at 1-309-668-9907, or visit [employer].nz Contact a health care provider if:  You have nausea, vomiting, or trouble with eating.  You have dizziness or weakness.  You or your family members become concerned for your safety. Get help right away if:  You feel depressed or sad, or feel that you want to harm yourself.  You develop chest pain or difficulty with breathing.  You pass out. If you ever feel like you may hurt yourself or others, or have thoughts about taking your own life, get help right away. You can go to your nearest emergency department or call:  Your local emergency services (911 in the [place]  A suicide crisis helpline, such as the [place] at (409) 104-0820. This is open 24 hours a day. Summary  Alzheimer's disease is a brain disease that affects memory, thinking, language, and behavior. Alzheimer's disease is a form of dementia.  This condition is diagnosed by a specialist in diseases of the nervous system (neurologist).  At this time, there is no treatment to cure Alzheimer's disease or stop it from getting worse. The goals of treatment are to slow memory loss and help you manage any symptoms.  Work with your family to make important decisions, such as advance directives, medical power of attorney, or a living will. This information is not intended to replace advice given to you by your health care provider. Make sure you discuss any questions you have with your health care provider. Document Revised: 02/01/2018 Document Reviewed: 02/01/2018 Elsevier Patient Education  2020 [employer].

## 2019-08-30 NOTE — Telephone Encounter (Signed)
Per Dr. Tomi Likens Dementia paperwork mailed to pt address on file.

## 2019-09-01 MED FILL — DULOXETINE HCL 60 MG CPEP: 60 | 30 days supply | Qty: 30 | Fill #3

## 2019-09-07 ENCOUNTER — Encounter: Payer: Self-pay | Admitting: Neurology

## 2019-09-28 ENCOUNTER — Other Ambulatory Visit: Payer: Self-pay | Admitting: Neurology

## 2019-09-28 ENCOUNTER — Other Ambulatory Visit: Payer: Self-pay | Admitting: Family Medicine

## 2019-09-28 MED FILL — DULOXETINE HCL 60 MG CPEP: 60 | 30 days supply | Qty: 30 | Fill #0

## 2019-09-28 MED FILL — DONEPEZIL HCL 10 MG TABLET: 10 | 30 days supply | Qty: 30 | Fill #0

## 2019-10-02 ENCOUNTER — Ambulatory Visit: Payer: 59 | Admitting: Neurology

## 2019-10-02 MED FILL — ROSUVASTATIN CALCIUM 20 MG: 20 | 30 days supply | Qty: 30 | Fill #2

## 2019-10-02 MED FILL — TRANDOLAPRIL 4 MG TABLET: 4 | 30 days supply | Qty: 30 | Fill #0

## 2019-10-02 MED FILL — FAMOTIDINE 20 MG TABLET: 20 | 30 days supply | Qty: 60 | Fill #1

## 2019-11-01 MED FILL — DONEPEZIL HCL 10 MG TABLET: 10 | 30 days supply | Qty: 30 | Fill #1

## 2019-11-01 MED FILL — FAMOTIDINE 20 MG TABLET: 20 | 30 days supply | Qty: 60 | Fill #2

## 2019-11-01 MED FILL — CYANOCOBALAMIN 1,000 MCG/ML: 1000 | 84 days supply | Qty: 3 | Fill #3

## 2019-11-01 MED FILL — ROSUVASTATIN CALCIUM 20 MG: 20 | 30 days supply | Qty: 30 | Fill #3

## 2019-11-01 MED FILL — DULOXETINE HCL 60 MG CPEP: 60 | 30 days supply | Qty: 30 | Fill #1

## 2019-11-30 MED FILL — TRANDOLAPRIL 4 MG TABLET: 4 | 30 days supply | Qty: 30 | Fill #1

## 2019-11-30 MED FILL — DONEPEZIL HCL 10 MG TABLET: 10 | 30 days supply | Qty: 30 | Fill #2

## 2019-11-30 MED FILL — ROSUVASTATIN CALCIUM 20 MG: 20 | 30 days supply | Qty: 30 | Fill #4

## 2019-11-30 MED FILL — DULOXETINE HCL 60 MG CPEP: 60 | 30 days supply | Qty: 30 | Fill #2

## 2019-11-30 MED FILL — FAMOTIDINE 20 MG TABLET: 20 | 30 days supply | Qty: 60 | Fill #3

## 2019-12-15 ENCOUNTER — Ambulatory Visit
Admission: RE | Admit: 2019-12-15 | Discharge: 2019-12-15 | Disposition: A | Discharge status: Home / Self Care | Payer: Disability Insurance | Source: Ambulatory Visit | Attending: Pediatrics | Admitting: Pediatrics

## 2019-12-15 ENCOUNTER — Other Ambulatory Visit: Payer: Self-pay | Admitting: Pediatrics

## 2019-12-15 DIAGNOSIS — M5126 Other intervertebral disc displacement, lumbar region: Secondary | ICD-10-CM

## 2019-12-15 DIAGNOSIS — M5136 Other intervertebral disc degeneration, lumbar region: Secondary | ICD-10-CM

## 2020-01-03 ENCOUNTER — Other Ambulatory Visit: Payer: Self-pay | Admitting: Gastroenterology

## 2020-01-03 MED FILL — TRANDOLAPRIL 4 MG TABLET: 4 | 30 days supply | Qty: 30 | Fill #2

## 2020-01-03 MED FILL — DULOXETINE HCL 60 MG CPEP: 60 | 30 days supply | Qty: 30 | Fill #3

## 2020-01-03 MED FILL — ROSUVASTATIN CALCIUM 20 MG: 20 | 30 days supply | Qty: 30 | Fill #5

## 2020-01-03 MED FILL — DONEPEZIL HCL 10 MG TABLET: 10 | 30 days supply | Qty: 30 | Fill #3

## 2020-01-04 MED FILL — FAMOTIDINE 20 MG TABLET: 20 | 30 days supply | Qty: 60 | Fill #0

## 2020-02-05 ENCOUNTER — Other Ambulatory Visit: Payer: Self-pay | Admitting: Neurology

## 2020-02-05 MED FILL — ROSUVASTATIN CALCIUM 20 MG: 20 | 30 days supply | Qty: 30 | Fill #6

## 2020-02-05 MED FILL — TRANDOLAPRIL 4 MG TABLET: 4 | 30 days supply | Qty: 30 | Fill #3

## 2020-02-05 MED FILL — FAMOTIDINE 20 MG TABLET: 20 | 30 days supply | Qty: 60 | Fill #0

## 2020-02-05 MED FILL — DONEPEZIL HCL 10 MG TABLET: 10 | 30 days supply | Qty: 30 | Fill #4

## 2020-02-05 MED FILL — DULOXETINE HCL 60 MG CPEP: 60 | 30 days supply | Qty: 30 | Fill #0

## 2020-02-27 ENCOUNTER — Ambulatory Visit (INDEPENDENT_AMBULATORY_CARE_PROVIDER_SITE_OTHER): Payer: Self-pay | Admitting: Neurology

## 2020-02-27 ENCOUNTER — Other Ambulatory Visit: Payer: Self-pay | Admitting: Neurology

## 2020-02-27 ENCOUNTER — Encounter: Payer: Self-pay | Admitting: Neurology

## 2020-02-27 ENCOUNTER — Other Ambulatory Visit: Payer: Self-pay

## 2020-02-27 VITALS — BP 121/54 | HR 75 | Resp 20 | Ht 63.0 in | Wt 155.0 lb

## 2020-02-27 DIAGNOSIS — F0280 Dementia in other diseases classified elsewhere without behavioral disturbance: Secondary | ICD-10-CM

## 2020-02-27 DIAGNOSIS — G309 Alzheimer's disease, unspecified: Secondary | ICD-10-CM

## 2020-02-27 DIAGNOSIS — G319 Degenerative disease of nervous system, unspecified: Secondary | ICD-10-CM

## 2020-02-27 MED ORDER — MEMANTINE HCL 10 MG PO TABS: ORAL_TABLET | ORAL | 0 refills | Status: DC

## 2020-02-27 MED FILL — MEMANTINE HCL 10 MG TABS: 10 | 38 days supply | Qty: 60 | Fill #0

## 2020-02-27 NOTE — Patient Instructions (Signed)
1.  Continue donepezil 10mg  at bedtime 2.  In addition, will start memantine 10mg  tablet:  - take 1/2 tablet at bedtime for one week  - then 1/2 tablet twice daily for one week  - then 1 tablet twice daily 3.  Follow up in 6 months.

## 2020-02-27 NOTE — Progress Notes (Signed)
NEUROLOGY FOLLOW UP OFFICE NOTE  [facility] [id number]   Subjective:  Christina Galvan is a 71 year oldright-handedfemale with HTN, chronic low back pain, migraines, depression, anxiety and history of kidney stones who follows up for early-onset Alzheimer's disease/posterior cortical atrophy  UPDATE: Started on donepezil in June.  Advised to apply for disability.  I filled out the paperwork but she has twice been denied.  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  She is living between her mother and another family member.  She does not drive.  Current NSAIDS:none Current analgesics:Goodys Current triptans:none Current ergotamine:none Current anti-emetic:none Current muscle relaxants:none Current anti-anxiolytic:none Current sleep aide:none Current Antihypertensive medications:none Current Antidepressant medications:Cymbalta 60mg  daily Current Anticonvulsant medications:none Current anti-CGRP:none Current Vitamins/Herbal/Supplements:B12 1045mcg injections monthly Current Antihistamines/Decongestants:none Other therapy:none Hormone/birth control:none Other medications:  Donepezil 10mg  at bedtime  HISTORY: Since 2020, she has had several medication changes for her anxiety and episodes of confusion. She was switched from buspirone to sertraline. She was then switched from sertraline to Cymbalta 30mg  in attempt to better treat anxiety and sciatic pain. She reports worsening symptoms since then. She describes episodes where she feels like she is in a fog, feeling more angry and sometimes has hallucinations of people walking. It is typically elicited when she is upset about something. 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 She reports that she sometimes files papers in the wrong chart or will sometimes forget names. Labs from November 2020  showed TSH 0.63 but also low B12 of 170.  She was started on B12 injections but reports no improvement.  MRI of brain without contrast from 02/07/2019 showed mild chronic small vessel ischemic changes but overall unremarkable.  In 2021, she also reported a persistent dull non-throbbing bifrontal headache. No associated nausea, vomiting, visual disturbance, numbness or weakness. She had been taking Goody powder daily. Around the same time that her symptoms worsened, she reported increased emotional stress. She had to financially support her son who was in legal trouble, which caused her to lose her home. She had to move in with her mother. She also has chronic low back pain with sciatic pain. She previously took tramadol daily which was discontinued.   Eye exam overall unremarkable. She got new glasses but no difference in headaches.  She underwent neuropsychological testing on 07/13/2019, which demonstrated global and significant impairment which suggests a posterior cortical dysfunction such as early-onset Alzheimer's disease or corticobasal syndrome but did not correlate with Lewy Body Disease.  For further evaluation, she underwent PET scan of brain on 08/22/2019, which demonstrated decreased cortical metabolism in the biparietal lobes and occipital lobes.  Past NSAIDS:Ibuprofen, naproxen Past analgesics:tramadol Past abortive triptans:none Past abortive ergotamine:none Past muscle relaxants:none Past anti-emetic:none Past antihypertensive medications:none Past antidepressant medications:Sertraline 200mg  daily Past anticonvulsant medications:none Past anti-CGRP:none   Past history: No history of seizures, head trauma  PAST MEDICAL HISTORY: Past Medical History:  Diagnosis Date  . Anxiety   . Anxiety and depression   . Depression with anxiety   . GERD (gastroesophageal reflux disease)   . History of frequent urinary tract infections   . Hypertension   . Kidney  stones   . Migraines     MEDICATIONS: Current Outpatient Medications on File Prior to Visit  Medication Sig Dispense Refill  . cyanocobalamin (,VITAMIN B-12,) 1000 MCG/ML injection Inject 1000 mcg (1 mL) IM once weekly for 3  weeks and then once monthly 10 mL 1  . donepezil (ARICEPT) 10 MG tablet Take 1 tablet (10 mg total) by mouth at bedtime. 30 tablet 4  . DULoxetine (CYMBALTA) 60 MG capsule TAKE 1 CAPSULE BY MOUTH ONCE DAILY 30 capsule 0  . famotidine (PEPCID) 20 MG tablet TAKE 2 TABLETS BY MOUTH ONCE DAILY 60 tablet 5  . omeprazole (PRILOSEC) 20 MG capsule TAKE 1 CAPSULE BY MOUTH ONCE A DAY 30 capsule 2  . rosuvastatin (CRESTOR) 20 MG tablet TAKE 1 TABLET BY MOUTH ONCE DAILY 90 tablet 3  . SYRINGE-NEEDLE, DISP, 3 ML (B-D 3CC LUER-LOK SYR 25GX1") 25G X 1" 3 ML MISC USE ONCE WEEKLY WITH B12 INJECTION FOR 3 WEEKS, THEN ONCE MONTHLY. 7 each 0  . trandolapril (MAVIK) 4 MG tablet TAKE 1 TABLET BY MOUTH ONCE A DAY 90 tablet 1   No current facility-administered medications on file prior to visit.    ALLERGIES: Allergies  Allergen Reactions  . Thiazide-Type Diuretics     Hypokalemia, Hyponatremia, Chest pain  . Mushroom Extract Complex   . Sulfa Antibiotics     FAMILY HISTORY: Family History  Problem Relation Age of Onset  . Arthritis Mother   . Heart disease Mother   . Hypertension Mother   . Diabetes Mother   . Arrhythmia Mother        A-Fib   . Cancer Father        Lung Cancer  . Diabetes Brother   . Asthma Daughter   . Cancer Paternal Uncle        Prostate cancer  . Heart disease Maternal Grandfather   . Heart disease Paternal Grandfather     SOCIAL HISTORY: Social History   Socioeconomic History  . Marital status: Single    Spouse name: Not on file  . Number of children: Not on file  . Years of education: Not on file  . Highest education level: Not on file  Occupational History  . Not on file  Tobacco Use  . Smoking status: Current Some Day Smoker     Packs/day: 0.05    Years: 30.00    Pack years: 1.50    Types: Cigarettes    Start date: 10/21/1984  . Smokeless tobacco: Never Used  Vaping Use  . Vaping Use: Never used  Substance and Sexual Activity  . Alcohol use: Not Currently    Alcohol/week: 0.0 standard drinks    Comment: Rare  . Drug use: No  . Sexual activity: Not Currently    Partners: Male    Birth control/protection: I.U.D.  Other Topics Concern  . Not on file  Social History Narrative   Works at [employer] with son and daughter   Pets: None   Caffeine- 1 20 oz bottle of soda       Right handed   One story    Social Determinants of Health   Financial Resource Strain: Not on file  Food Insecurity: Not on file  Transportation Needs: Not on file  Physical Activity: Not on file  Stress: Not on file  Social Connections: Not on file  Intimate Partner Violence: Not on file     Objective:  Blood pressure (!) 121/54, pulse 75, resp. rate 20, height 5\' 3"  (1.6 m), weight 155 lb (70.3 kg), SpO2 99 %. General: No acute distress.  Patient appears well-groomed.   Head:  Normocephalic/atraumatic Eyes:  Fundi examined but not visualized Neck: supple, no paraspinal tenderness, full range  of motion Heart:  Regular rate and rhythm Lungs:  Clear to auscultation bilaterally Back: No paraspinal tenderness Neurological Exam:  [employer] Mental Exam 02/27/2020  Weekday Correct 1  Current year 0  What state are we in? 1  Amount spent 0  Amount left 0  # of Animals 2  5 objects recall 2  Number series 1  Hour markers 0  Time correct 0  Placed X in triangle correctly 1  Largest Figure 1  Name of female 0  Date back to work 0  Type of work 0  State she lived in 0  Total score 9   CN II-XII intact. Bulk and tone normal, muscle strength 5/5 throughout.  Sensation to light touch, temperature and vibration intact.  Deep tendon reflexes 2+ throughout, toes downgoing.  Finger to nose and heel to shin  testing intact.  Gait normal, Romberg negative.   Assessment/Plan:   1.  Major neurocognitive disorder, likely Alzheimer's dementia with posterior cortical dysfunction.  I am not sure why she has been denied disability as she cannot work given her cognitive impairment.  1.  In addition to donepezil 10mg  at bedtime, will start memantine titrating to 10mg  twice daily. 2.  Follow up 6 months.  Metta Clines, DO  CC: Tommi Rumps, MD

## 2020-02-29 ENCOUNTER — Ambulatory Visit: Payer: Disability Insurance | Admitting: Neurology

## 2020-02-29 ENCOUNTER — Ambulatory Visit: Payer: 59 | Admitting: Neurology

## 2020-03-05 ENCOUNTER — Other Ambulatory Visit: Payer: Self-pay | Admitting: Neurology

## 2020-03-05 MED FILL — DONEPEZIL HCL 10 MG TABLET: 10 | 30 days supply | Qty: 30 | Fill #0

## 2020-03-05 MED FILL — TRANDOLAPRIL 4 MG TABLET: 4 | 30 days supply | Qty: 30 | Fill #4

## 2020-03-05 MED FILL — DULOXETINE HCL 60 MG CPEP: 60 | 30 days supply | Qty: 30 | Fill #0

## 2020-03-05 MED FILL — FAMOTIDINE 20 MG TABLET: 20 | 30 days supply | Qty: 60 | Fill #1

## 2020-03-05 MED FILL — ROSUVASTATIN CALCIUM 20 MG: 20 | 30 days supply | Qty: 30 | Fill #7

## 2020-03-06 ENCOUNTER — Other Ambulatory Visit: Payer: Self-pay

## 2020-03-06 ENCOUNTER — Other Ambulatory Visit: Payer: Disability Insurance

## 2020-03-06 DIAGNOSIS — Z20822 Contact with and (suspected) exposure to covid-19: Secondary | ICD-10-CM

## 2020-03-08 LAB — SARS-COV-2, NAA 2 DAY TAT

## 2020-03-08 LAB — NOVEL CORONAVIRUS, NAA: SARS-CoV-2, NAA: NOT DETECTED

## 2020-04-22 ENCOUNTER — Other Ambulatory Visit: Payer: Self-pay | Admitting: Family Medicine

## 2020-04-22 ENCOUNTER — Other Ambulatory Visit: Payer: Self-pay | Admitting: Neurology

## 2020-04-22 DIAGNOSIS — E785 Hyperlipidemia, unspecified: Secondary | ICD-10-CM

## 2020-04-22 MED FILL — DONEPEZIL HCL 10 MG TABLET: 10 | 30 days supply | Qty: 30 | Fill #1 | Status: TO

## 2020-04-22 MED FILL — MEMANTINE HCL 10 MG TABS: 10 | 30 days supply | Qty: 60 | Fill #0 | Status: TO

## 2020-04-22 MED FILL — TRANDOLAPRIL 4 MG TABLET: 4 | 30 days supply | Qty: 30 | Fill #5 | Status: TO

## 2020-04-22 MED FILL — ROSUVASTATIN CALCIUM 20 MG: 20 | 30 days supply | Qty: 30 | Fill #0 | Status: TO

## 2020-04-22 MED FILL — DULOXETINE HCL 60 MG CPEP: 60 | 30 days supply | Qty: 30 | Fill #0 | Status: TO

## 2020-04-23 ENCOUNTER — Other Ambulatory Visit: Payer: Self-pay | Admitting: Neurology

## 2020-04-23 ENCOUNTER — Telehealth: Payer: Self-pay

## 2020-04-23 ENCOUNTER — Telehealth: Payer: Self-pay | Admitting: Neurology

## 2020-04-23 MED ORDER — DULOXETINE HCL 60 MG PO CPEP: 60.0000 mg | ORAL_CAPSULE | Freq: Every day | ORAL | 0 refills | Status: DC

## 2020-04-23 MED ORDER — TRANDOLAPRIL 4 MG PO TABS: 4.0000 mg | ORAL_TABLET | Freq: Every day | ORAL | 1 refills | Status: DC

## 2020-04-23 MED ORDER — MEMANTINE HCL 10 MG PO TABS: ORAL_TABLET | ORAL | 0 refills | Status: DC

## 2020-04-23 NOTE — Addendum Note (Signed)
Addended byElpidio Galea T on: 04/23/2020 12:34 PM   Modules accepted: Orders

## 2020-04-23 NOTE — Telephone Encounter (Signed)
Pt needs refill on trandolapril (MAVIK) 4 MG tablet and needs pharmacy changed to [place]

## 2020-04-23 NOTE — Telephone Encounter (Signed)
Pt advised of medication sent to new pharmacy. Pt also advised to schedule a f/u visit.   Front desk the pt was unable to do that today. When you get a chance can you call pt tomorrow to schedule a f/u visit?

## 2020-04-23 NOTE — Telephone Encounter (Signed)
Sent to [facility].  30 day supply.  Patient needs to make a follow up appointment for further refills.

## 2020-04-24 NOTE — Telephone Encounter (Signed)
Follow up scheduled for 07/01/20 at 2:30.

## 2020-06-12 ENCOUNTER — Other Ambulatory Visit: Payer: Self-pay | Admitting: Neurology

## 2020-06-17 ENCOUNTER — Other Ambulatory Visit: Payer: Self-pay | Admitting: Neurology

## 2020-06-30 NOTE — Progress Notes (Signed)
Virtual Visit via Video Note The purpose of this virtual visit is to provide medical care while limiting exposure to the novel coronavirus.    Consent was obtained for video visit:  yes Answered questions that patient had about telehealth interaction:  yes I discussed the limitations, risks, security and privacy concerns of performing an evaluation and management service by telemedicine. I also discussed with the patient that there may be a patient responsible charge related to this service. The patient expressed understanding and agreed to proceed.  Pt location: Home Physician Location: office Name of referring provider:  Leone Haven, MD I connected with Christina Galvan at patients initiation/request on 07/01/2020 at  2:30 PM EDT by video enabled telemedicine application and verified that I am speaking with the correct person using two identifiers. Pt MRN:  [id number] Pt DOB:  11/30/1965 Video Participants:  Christina Galvan  Assessment and Plan:   1.  Major neurocognitive disorder, likely Alzheimer's dementia with posterior cortical dysfunction.  I am not sure why she has been denied disability as she cannot work given her cognitive impairment. 2.  Tension-type headaches 3.  Chronic low back pain with lumbosacral radiculopathy, bilateral 4.  Depression with anxiety.  1.  Donepezil 10mg  QHS and memantine 10mg  BID 2.  For headache/anxiety, continue Cymbalta 60mg  QD 3.  For sciatic pain, start gabapentin 100mg  TID.  We can increase dose in 2 weeks if needed. 4.  For anxiety/depression, refer to [facility]. 5.  Follow up 6 months.  History of Present Illness:  Christina Galvan is a 52 year oldright-handedfemale with HTN, chronic low back pain, migraines, depression, anxiety and history of kidney stones who follows up for early-onset Alzheimer's disease/posterior cortical atrophy  UPDATE: She has received disability.  Patient has chronic low back pain with  radiculopathy.  Worked up in past.  MRI of lumbar spine on 08/30/2018 personally reviewed showed broad-based central disc protrusions at L4-5 causing mild to moderate bilateral lateral recess stenosis potentially affecting either L5 nerve roots, and broad-based central disc protrusion at L5-S1 approximating both S1 nerve roots but no obvious impingement.  Follow up lumbar x-ray on 12/15/2019 personally reviewed was negative.  Back and leg pain aggravated with bilateral leg numbness particularly with prolonged walking and walking up inclines.    Regarding memory, she has good days and bad days.  Reports significant anxiety.  Frequent crying spells.  While Cymbalta is helpful with headaches, no longer effective for depression and anxiety.  Headaches are moderate and occur 4 days a week.  Does not treat with analges  Current NSAIDS:none Current analgesics:Goodys Current triptans:none Current ergotamine:noneics. Current anti-emetic:none Current muscle relaxants:none Current anti-anxiolytic:none Current sleep aide:none Current Antihypertensive medications:none Current Antidepressant medications:Cymbalta 60mg  daily Current Anticonvulsant medications:none Current anti-CGRP:none Current Vitamins/Herbal/Supplements:B12 1088mcg injections monthly Current Antihistamines/Decongestants:none Other therapy:none Hormone/birth control:none Other medications:  Donepezil 10mg  at bedtime, memantine 10mg  BID  HISTORY: Since 2020, she has had several medication changes for her anxiety and episodes of confusion. She was switched from buspirone to sertraline. She was then switched from sertraline to Cymbalta 30mg  in attempt to better treat anxiety and sciatic pain. She reports worsening symptoms since then. She describes episodes where she feels like she is in a fog, feeling more angry and sometimes has hallucinations of people walking. It is typically elicited when she is  upset about something. 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 She reports that she sometimes  files papers in the wrong chart or will sometimes forget names. Labs from November 2020 showedTSH 0.63 but alsolow B12 of 170.She was started on B12 injections but reports no improvement.  MRI of brain without contrast from 02/07/2019 showed mild chronic small vessel ischemic changes but overall unremarkable.  In 2021, she also reported a persistent dull non-throbbing bifrontal headache. No associated nausea, vomiting, visual disturbance, numbness or weakness. She had been taking Goody powder daily. Around the same time that her symptoms worsened, she reported increased emotional stress. She had to financially support her son who was in legal trouble, which caused her to lose her home. She had to move in with her mother. She also has chronic low back pain with sciatic pain. She previously took tramadol daily which was discontinued.   Eye exam overall unremarkable. She got new glasses but no difference in headaches.  She underwent neuropsychological testing on 07/13/2019, which demonstrated global and significant impairment which suggests a posterior cortical dysfunction such as early-onset Alzheimer's disease or corticobasal syndrome but did not correlate with Lewy Body Disease. For further evaluation, she underwent PET scan of brain on 08/22/2019, which demonstrated decreased cortical metabolism in the biparietal lobes and occipital lobes.  Past NSAIDS:Ibuprofen, naproxen Past analgesics:tramadol Past abortive triptans:none Past abortive ergotamine:none Past muscle relaxants:none Past anti-emetic:none Past antihypertensive medications:none Past antidepressant medications:Sertraline 200mg  daily Past anticonvulsant medications:none Past anti-CGRP:none   Past history: No history of seizures, head trauma  Past Medical  History: Past Medical History:  Diagnosis Date  . Anxiety   . Anxiety and depression   . Depression with anxiety   . GERD (gastroesophageal reflux disease)   . History of frequent urinary tract infections   . Hypertension   . Kidney stones   . Migraines     Medications: Outpatient Encounter Medications as of 07/01/2020  Medication Sig  . DULoxetine (CYMBALTA) 60 MG capsule TAKE 1 CAPSULE BY MOUTH ONCE DAILY. PLEASE MAKE A FOLLOW UP APPOINTMENT FOR REFILLS  . memantine (NAMENDA) 10 MG tablet TAKE 1 TABLET BY MOUTH TWICE A DAY. PLEASE SCHEDULE A FOLLOW UP APPOINTMENT FOR REFILLS  . cyanocobalamin (,VITAMIN B-12,) 1000 MCG/ML injection Inject 1000 mcg (1 mL) IM once weekly for 3 weeks and then once monthly  . donepezil (ARICEPT) 10 MG tablet TAKE 1 TABLET BY MOUTH AT BEDTIME  . famotidine (PEPCID) 20 MG tablet TAKE 2 TABLETS BY MOUTH ONCE DAILY  . omeprazole (PRILOSEC) 20 MG capsule TAKE 1 CAPSULE BY MOUTH ONCE A DAY  . rosuvastatin (CRESTOR) 20 MG tablet TAKE 1 TABLET BY MOUTH ONCE DAILY  . SYRINGE-NEEDLE, DISP, 3 ML (B-D 3CC LUER-LOK SYR 25GX1") 25G X 1" 3 ML MISC USE ONCE WEEKLY WITH B12 INJECTION FOR 3 WEEKS, THEN ONCE MONTHLY.  . trandolapril (MAVIK) 4 MG tablet TAKE 1 TABLET BY MOUTH ONCE A DAY   No facility-administered encounter medications on file as of 07/01/2020.    Allergies: Allergies  Allergen Reactions  . Thiazide-Type Diuretics     Hypokalemia, Hyponatremia, Chest pain  . Mushroom Extract Complex   . Sulfa Antibiotics     Family History: Family History  Problem Relation Age of Onset  . Arthritis Mother   . Heart disease Mother   . Hypertension Mother   . Diabetes Mother   . Arrhythmia Mother        A-Fib   . Cancer Father        Lung Cancer  . Diabetes Brother   . Asthma Daughter   .  Cancer Paternal Uncle        Prostate cancer  . Heart disease Maternal Grandfather   . Heart disease Paternal Grandfather     Observations/Objective:   Height 5\' 3"   (1.6 m), weight 145 lb (65.8 kg). No acute distress.  Alert and oriented.  Speech fluent and not dysarthric.  Language intact.     Follow Up Instructions:    -I discussed the assessment and treatment plan with the patient. The patient was provided an opportunity to ask questions and all were answered. The patient agreed with the plan and demonstrated an understanding of the instructions.   The patient was advised to call back or seek an in-person evaluation if the symptoms worsen or if the condition fails to improve as anticipated.      Dudley Major, DO

## 2020-07-01 ENCOUNTER — Encounter: Payer: Self-pay | Admitting: Neurology

## 2020-07-01 ENCOUNTER — Other Ambulatory Visit: Payer: Self-pay

## 2020-07-01 ENCOUNTER — Telehealth (INDEPENDENT_AMBULATORY_CARE_PROVIDER_SITE_OTHER): Payer: Self-pay | Admitting: Neurology

## 2020-07-01 VITALS — Ht 63.0 in | Wt 145.0 lb

## 2020-07-01 DIAGNOSIS — M5442 Lumbago with sciatica, left side: Secondary | ICD-10-CM

## 2020-07-01 DIAGNOSIS — G44221 Chronic tension-type headache, intractable: Secondary | ICD-10-CM

## 2020-07-01 DIAGNOSIS — F418 Other specified anxiety disorders: Secondary | ICD-10-CM

## 2020-07-01 DIAGNOSIS — F0280 Dementia in other diseases classified elsewhere without behavioral disturbance: Secondary | ICD-10-CM

## 2020-07-01 DIAGNOSIS — G8929 Other chronic pain: Secondary | ICD-10-CM

## 2020-07-01 DIAGNOSIS — M5441 Lumbago with sciatica, right side: Secondary | ICD-10-CM

## 2020-07-01 DIAGNOSIS — G319 Degenerative disease of nervous system, unspecified: Secondary | ICD-10-CM

## 2020-07-01 DIAGNOSIS — G309 Alzheimer's disease, unspecified: Secondary | ICD-10-CM

## 2020-07-01 MED ORDER — GABAPENTIN 100 MG PO CAPS
100.0000 mg | ORAL_CAPSULE | Freq: Three times a day (TID) | ORAL | 5 refills | Status: DC
Start: 2020-07-01 — End: 2020-10-03

## 2020-07-01 NOTE — Addendum Note (Signed)
Addended by: Venetia Night on: 07/01/2020 02:22 PM   Modules accepted: Orders

## 2020-07-22 ENCOUNTER — Other Ambulatory Visit: Payer: Self-pay | Admitting: Neurology

## 2020-08-20 ENCOUNTER — Other Ambulatory Visit: Payer: Self-pay | Admitting: Family Medicine

## 2020-08-20 ENCOUNTER — Other Ambulatory Visit: Payer: Self-pay | Admitting: Neurology

## 2020-08-20 DIAGNOSIS — E785 Hyperlipidemia, unspecified: Secondary | ICD-10-CM

## 2020-09-13 ENCOUNTER — Other Ambulatory Visit: Payer: Self-pay | Admitting: Gastroenterology

## 2020-10-03 ENCOUNTER — Other Ambulatory Visit: Payer: Self-pay | Admitting: Neurology

## 2020-10-03 ENCOUNTER — Other Ambulatory Visit (HOSPITAL_COMMUNITY): Payer: Self-pay

## 2020-10-03 MED ORDER — RIZATRIPTAN BENZOATE 10 MG PO TBDP
ORAL_TABLET | ORAL | 5 refills | Status: DC
  Filled 2020-10-03: qty 10, 15d supply, fill #0

## 2020-10-03 MED ORDER — NORTRIPTYLINE HCL 10 MG PO CAPS
ORAL_CAPSULE | ORAL | 0 refills | Status: DC
Start: 2020-10-03 — End: 2020-12-31
  Filled 2020-10-03: qty 60, 33d supply, fill #0

## 2020-10-03 MED ORDER — ONDANSETRON 4 MG PO TBDP
4.0000 mg | ORAL_TABLET | Freq: Three times a day (TID) | ORAL | 5 refills | Status: DC | PRN
Start: 2020-10-03 — End: 2021-10-06
  Filled 2020-10-03: qty 20, 7d supply, fill #0

## 2020-10-11 ENCOUNTER — Telehealth: Payer: Self-pay | Admitting: Neurology

## 2020-10-11 NOTE — Telephone Encounter (Signed)
Pt daughter said she needs a statement with DX. Date and signed. It needs to be faxed to  [place] document control [phone]

## 2020-10-11 NOTE — Telephone Encounter (Signed)
Spoke to pt daughter, Walker Shadow out of the office for the next week.  Please have [employer] fax over the forms so Dr. Tomi Likens could fill it out.

## 2020-10-12 ENCOUNTER — Other Ambulatory Visit (HOSPITAL_COMMUNITY): Payer: Self-pay

## 2020-10-21 NOTE — Telephone Encounter (Signed)
Pt daughter called back today and states that she needs a print out of the DX date for the patient retirement account  please call

## 2020-10-22 ENCOUNTER — Other Ambulatory Visit: Payer: Self-pay | Admitting: Family Medicine

## 2020-10-23 ENCOUNTER — Telehealth: Payer: Self-pay | Admitting: Family Medicine

## 2020-10-23 ENCOUNTER — Emergency Department
Admission: EM | Admit: 2020-10-23 | Discharge: 2020-10-23 | Disposition: A | Discharge status: Home / Self Care | Payer: Self-pay | Attending: Emergency Medicine | Admitting: Emergency Medicine

## 2020-10-23 ENCOUNTER — Ambulatory Visit
Admission: EM | Admit: 2020-10-23 | Discharge: 2020-10-23 | Payer: Disability Insurance | Attending: Emergency Medicine | Admitting: Emergency Medicine

## 2020-10-23 ENCOUNTER — Emergency Department: Payer: Self-pay

## 2020-10-23 ENCOUNTER — Other Ambulatory Visit: Payer: Self-pay

## 2020-10-23 DIAGNOSIS — F17210 Nicotine dependence, cigarettes, uncomplicated: Secondary | ICD-10-CM | POA: Insufficient documentation

## 2020-10-23 DIAGNOSIS — I10 Essential (primary) hypertension: Secondary | ICD-10-CM | POA: Insufficient documentation

## 2020-10-23 DIAGNOSIS — R109 Unspecified abdominal pain: Secondary | ICD-10-CM | POA: Insufficient documentation

## 2020-10-23 DIAGNOSIS — R300 Dysuria: Secondary | ICD-10-CM | POA: Insufficient documentation

## 2020-10-23 DIAGNOSIS — Z79899 Other long term (current) drug therapy: Secondary | ICD-10-CM | POA: Insufficient documentation

## 2020-10-23 DIAGNOSIS — R34 Anuria and oliguria: Secondary | ICD-10-CM

## 2020-10-23 LAB — CBC
HCT: 39 % (ref 36.0–46.0)
Hemoglobin: 13.8 g/dL (ref 12.0–15.0)
MCH: 33.4 pg (ref 26.0–34.0)
MCHC: 35.4 g/dL (ref 30.0–36.0)
MCV: 94.4 fL (ref 80.0–100.0)
Platelets: 197 10*3/uL (ref 150–400)
RBC: 4.13 MIL/uL (ref 3.87–5.11)
RDW: 12.6 % (ref 11.5–15.5)
WBC: 10.9 10*3/uL — ABNORMAL HIGH (ref 4.0–10.5)
nRBC: 0 % (ref 0.0–0.2)

## 2020-10-23 LAB — BASIC METABOLIC PANEL
Anion gap: 8 (ref 5–15)
BUN: 14 mg/dL (ref 6–20)
CO2: 28 mmol/L (ref 22–32)
Calcium: 9.6 mg/dL (ref 8.9–10.3)
Chloride: 103 mmol/L (ref 98–111)
Creatinine, Ser: 0.53 mg/dL (ref 0.44–1.00)
GFR, Estimated: >60 mL/min (ref >60)
Glucose, Bld: 104 mg/dL — ABNORMAL HIGH (ref 70–99)
Potassium: 2.8 mmol/L — ABNORMAL LOW (ref 3.5–5.1)
Sodium: 139 mmol/L (ref 135–145)

## 2020-10-23 LAB — URINALYSIS, COMPLETE (UACMP) WITH MICROSCOPIC
Bilirubin Urine: NEGATIVE
Glucose, UA: NEGATIVE mg/dL
Ketones, ur: NEGATIVE mg/dL
Leukocytes,Ua: NEGATIVE
Nitrite: NEGATIVE
Protein, ur: 30 mg/dL — AB
Specific Gravity, Urine: 1.006 (ref 1.005–1.030)
pH: 6 (ref 5.0–8.0)

## 2020-10-23 LAB — POCT URINALYSIS DIP (MANUAL ENTRY)
Bilirubin, UA: NEGATIVE
Glucose, UA: 100 mg/dL — AB
Leukocytes, UA: NEGATIVE
Nitrite, UA: NEGATIVE
Protein Ur, POC: 100 mg/dL — AB
Spec Grav, UA: >=1.03 — AB (ref 1.010–1.025)
Urobilinogen, UA: 1 E.U./dL
pH, UA: 5 (ref 5.0–8.0)

## 2020-10-23 MED ORDER — SODIUM CHLORIDE 0.9 % IV BOLUS
1000.0000 mL | Freq: Once | INTRAVENOUS | Status: AC
  Administered 2020-10-23: 1000 mL via INTRAVENOUS

## 2020-10-23 MED ORDER — IBUPROFEN 600 MG PO TABS: 600.0000 mg | ORAL_TABLET | Freq: Four times a day (QID) | ORAL | 0 refills | Status: DC | PRN

## 2020-10-23 MED ORDER — KETOROLAC TROMETHAMINE 30 MG/ML IJ SOLN
30.0000 mg | Freq: Once | INTRAMUSCULAR | Status: AC
  Administered 2020-10-23: 30 mg via INTRAVENOUS
  Filled 2020-10-23: qty 1

## 2020-10-23 MED ORDER — TRAMADOL HCL 50 MG PO TABS: 50.0000 mg | ORAL_TABLET | Freq: Four times a day (QID) | ORAL | 0 refills | Status: AC | PRN

## 2020-10-23 NOTE — ED Notes (Signed)
Bladder scan = 98mL.

## 2020-10-23 NOTE — ED Notes (Signed)
Patient is being discharged from the Urgent Care and sent to the Emergency Department via POV . Per Hall Busing, patient is in need of higher level of care due to decrease in output and left flank pain. Patient is aware and verbalizes understanding of plan of care.  Vitals:   10/23/20 1102  BP: 129/74  Pulse: 70  Resp: 16  Temp: 98.1 F (36.7 C)  SpO2: 98%

## 2020-10-23 NOTE — Discharge Instructions (Addendum)
It is likely that you had a kidney stone which passed.  You may take the ibuprofen as needed for pain, and the tramadol only if needed for breakthrough pain that is not controlled by ibuprofen.  Return to the ER for new, worsening, or persistent severe pain, fever, weakness, urinary symptoms, vomiting, or any other new or worsening symptoms that concern you.  Follow-up with your primary care doctor.

## 2020-10-23 NOTE — ED Triage Notes (Signed)
Pt to ED reports has thought she had UTI the past few days, increased urine intake, states only able to pee a little bit, states has to push to get pee out C/o left flank pain +burning with urination

## 2020-10-23 NOTE — ED Triage Notes (Addendum)
Patient presents to Urgent Care with complaints of flank pain, dysuria x 2 days. Pt states she has been increasing her po intake and unable to urinate today. Pt has a hx of UTIs and kidney stones.   Denies fever or abdominal pain.

## 2020-10-23 NOTE — Telephone Encounter (Signed)
She needs to have an appointment scheduled and then I will consider refilling this.

## 2020-10-23 NOTE — Telephone Encounter (Signed)
Last couple of ov notes printed out for daughter come by and pick up.

## 2020-10-23 NOTE — ED Provider Notes (Signed)
[facility]    CSN: [id number] Arrival date & time: 10/23/20  1051      History   Chief Complaint Chief Complaint  Patient presents with   Flank Pain   Dysuria   Urinary Urgency    HPI Christina Galvan is a 55 y.o. female.  Patient presents with 2-day history of left flank pain and dysuria.  She reports decreased urine output today.  Denies fever, chills, abdominal pain, vaginal discharge, pelvic pain, or other symptoms.  Her medical history includes kidney stones, frequent UTIs, hypertension, chronic back pain, vertigo, GERD, anxiety, depression.  The history is provided by the patient and medical records.   Past Medical History:  Diagnosis Date   Anxiety    Anxiety and depression    Depression with anxiety    GERD (gastroesophageal reflux disease)    History of frequent urinary tract infections    Hypertension    Kidney stones    Migraines     Patient Active Problem List   Diagnosis Date Noted   B12 deficiency 02/09/2019   Memory difficulty 01/13/2019   HA (headache) 01/13/2019   Vertigo 06/08/2018   Unintentional weight loss 11/29/2016   Hyperlipidemia 08/24/2016   Hypokalemia 12/09/2015   Exertional chest pain 11/30/2015   Chronic back pain 10/31/2015   Tobacco abuse 10/31/2015   Essential hypertension 10/31/2015   GERD (gastroesophageal reflux disease) 10/28/2014   Migraines 10/28/2014   Anxiety and depression 10/28/2014    Past Surgical History:  Procedure Laterality Date   carpal tunnel     [place]   COLONOSCOPY WITH PROPOFOL N/A 02/12/2017   Procedure: COLONOSCOPY WITH PROPOFOL;  Surgeon: Jonathon Bellows, MD;  Location: [facility] ENDOSCOPY;  Service: Gastroenterology;  Laterality: N/A;    OB History   No obstetric history on file.      Home Medications    Prior to Admission medications   Medication Sig Start Date End Date Taking? Authorizing Provider  cyanocobalamin (,VITAMIN B-12,) 1000 MCG/ML  injection Inject 1000 mcg (1 mL) IM once weekly for 3 weeks and then once monthly 01/19/19   Leone Haven, MD  donepezil (ARICEPT) 10 MG tablet TAKE 1 TABLET BY MOUTH EVERY NIGHT AT BEDTIME 08/20/20   Pieter Partridge, DO  DULoxetine (CYMBALTA) 60 MG capsule Take 1 capsule (60 mg total) by mouth daily. 07/22/20   Tomi Likens, Adam R, DO  famotidine (PEPCID) 20 MG tablet TAKE 2 TABLETS BY MOUTH ONCE DAILY 01/04/20   Jonathon Bellows, MD  memantine (NAMENDA) 10 MG tablet TAKE 1 TABLET BY MOUTH TWICE A DAY. 07/22/20   Tomi Likens, Adam R, DO  nortriptyline (PAMELOR) 10 MG capsule Take 1 capsule at bedtime for one week, then increase to 2 capsules at bedtime. 10/03/20   Pieter Partridge, DO  omeprazole (PRILOSEC) 20 MG capsule TAKE 1 CAPSULE BY MOUTH ONCE A DAY 08/25/18   Leone Haven, MD  ondansetron (ZOFRAN ODT) 4 MG disintegrating tablet Dissolve 1 tablet by mouth every 8 hours as needed for nausea or vomiting. 10/03/20   Pieter Partridge, DO  rizatriptan (MAXALT-MLT) 10 MG disintegrating tablet Take 1 tablet earliest onset of headache.  May repeat in 2 hours if needed.  Maximum 2 tablets in 24 hours. 10/03/20   Tomi Likens, Adam R, DO  rosuvastatin (CRESTOR) 20 MG tablet TAKE 1 TABLET BY MOUTH ONCE A DAY 08/20/20   Leone Haven, MD  SYRINGE-NEEDLE, DISP, 3 ML (B-D 3CC LUER-LOK  SYR 25GX1") 25G X 1" 3 ML MISC USE ONCE WEEKLY WITH B12 INJECTION FOR 3 WEEKS, THEN ONCE MONTHLY. 08/04/19   Leone Haven, MD  trandolapril ([place]) 4 MG tablet TAKE 1 TABLET BY MOUTH ONCE A DAY 04/23/20 04/23/21  Leone Haven, MD    Family History Family History  Problem Relation Age of Onset   Arthritis Mother    Heart disease Mother    Hypertension Mother    Diabetes Mother    Arrhythmia Mother        A-Fib    Cancer Father        Lung Cancer   Diabetes Brother    Asthma Daughter    Cancer Paternal Uncle        Prostate cancer   Heart disease Maternal Grandfather    Heart disease Paternal Grandfather     Social  History Social History   Tobacco Use   Smoking status: Some Days    Packs/day: 0.05    Years: 30.00    Pack years: 1.50    Types: Cigarettes    Start date: 10/21/1984   Smokeless tobacco: Never  Vaping Use   Vaping Use: Never used  Substance Use Topics   Alcohol use: Not Currently    Alcohol/week: 0.0 standard drinks    Comment: Rare   Drug use: No     Allergies   Thiazide-type diuretics, Mushroom extract complex, and Sulfa antibiotics   Review of Systems Review of Systems  Constitutional:  Negative for chills and fever.  Respiratory:  Negative for cough and shortness of breath.   Cardiovascular:  Negative for chest pain and palpitations.  Gastrointestinal:  Negative for abdominal pain and vomiting.  Genitourinary:  Positive for dysuria and flank pain. Negative for hematuria, pelvic pain and vaginal discharge.  Skin:  Negative for color change and rash.  All other systems reviewed and are negative.   Physical Exam Triage Vital Signs ED Triage Vitals  Enc Vitals Group     BP      Pulse      Resp      Temp      Temp src      SpO2      Weight      Height      Head Circumference      Peak Flow      Pain Score      Pain Loc      Pain Edu?      Excl. in [place]?    No data found.  Updated Vital Signs BP 129/74 (BP Location: Left Arm)   Pulse 70   Temp 98.1 F (36.7 C) (Temporal)   Resp 16   SpO2 98%   Visual Acuity Right Eye Distance:   Left Eye Distance:   Bilateral Distance:    Right Eye Near:   Left Eye Near:    Bilateral Near:     Physical Exam Vitals and nursing note reviewed.  Constitutional:      General: She is not in acute distress.    Appearance: She is well-developed. She is ill-appearing.  HENT:     Head: Normocephalic and atraumatic.     Mouth/Throat:     Mouth: Mucous membranes are moist.  Eyes:     Conjunctiva/sclera: Conjunctivae normal.  Cardiovascular:     Rate and Rhythm: Normal rate and regular rhythm.     Heart sounds: No  murmur heard. Pulmonary:     Effort: Pulmonary effort is  normal. No respiratory distress.     Breath sounds: Normal breath sounds.  Abdominal:     General: Bowel sounds are normal.     Palpations: Abdomen is soft.     Tenderness: There is no abdominal tenderness. There is left CVA tenderness. There is no right CVA tenderness, guarding or rebound.     Comments: Acute left CVA tenderness.  Musculoskeletal:     Cervical back: Neck supple.  Skin:    General: Skin is warm and dry.  Neurological:     General: No focal deficit present.     Mental Status: She is alert and oriented to person, place, and time.  Psychiatric:        Mood and Affect: Mood normal.        Behavior: Behavior normal.     UC Treatments / Results  Labs (all labs ordered are listed, but only abnormal results are displayed) Labs Reviewed  POCT URINALYSIS DIP (MANUAL ENTRY) - Abnormal; Notable for the following components:      Result Value   Glucose, UA =100 (*)    Ketones, POC UA trace (5) (*)    Spec Grav, UA >=1.030 (*)    Blood, UA large (*)    Protein Ur, POC =100 (*)    All other components within normal limits    EKG   Radiology No results found.  Procedures Procedures (including critical care time)  Medications Ordered in UC Medications - No data to display  Initial Impression / Assessment and Plan / UC Course  I have reviewed the triage vital signs and the nursing notes.  Pertinent labs & imaging results that were available during my care of the patient were reviewed by me and considered in my medical decision making (see chart for details).  Acute left flank pain and decreased urine output.  Patient has history of kidney stones and frequent UTIs.  I am concerned for the possibility of an obstructing kidney stone due to her decreased urine output today and acute flank pain.  Sending her to the ED for evaluation.  Her sister drove her here and will drive her to the ED.    Final Clinical  Impressions(s) / UC Diagnoses   Final diagnoses:  Left flank pain  Decreased urine output     Discharge Instructions      Go to the emergency department for evaluation.         ED Prescriptions   None    I have reviewed the PDMP during this encounter.   Sharion Balloon, NP 10/23/20 1137

## 2020-10-23 NOTE — ED Provider Notes (Signed)
[facility] Emergency Department Provider Note ____________________________________________   Event Date/Time   First MD Initiated Contact with Patient 10/23/20 1426     (approximate)  I have reviewed the triage vital signs and the nursing notes.   HISTORY  Chief Complaint Flank Pain   HPI Christina Galvan is a 55 y.o. female with PMH as noted below including recurrent UTIs, kidney stone, hypertension, and GERD who presents with left flank pain over the last 2 days, persistent course, associated with some dysuria as well as difficulty urinating and hesitancy.  She denies any associated fever or chills, vomiting or diarrhea, or hematuria.  Past Medical History:  Diagnosis Date   Anxiety    Anxiety and depression    Depression with anxiety    GERD (gastroesophageal reflux disease)    History of frequent urinary tract infections    Hypertension    Kidney stones    Migraines     Patient Active Problem List   Diagnosis Date Noted   B12 deficiency 02/09/2019   Memory difficulty 01/13/2019   HA (headache) 01/13/2019   Vertigo 06/08/2018   Unintentional weight loss 11/29/2016   Hyperlipidemia 08/24/2016   Hypokalemia 12/09/2015   Exertional chest pain 11/30/2015   Chronic back pain 10/31/2015   Tobacco abuse 10/31/2015   Essential hypertension 10/31/2015   GERD (gastroesophageal reflux disease) 10/28/2014   Migraines 10/28/2014   Anxiety and depression 10/28/2014    Past Surgical History:  Procedure Laterality Date   carpal tunnel     [place]   COLONOSCOPY WITH PROPOFOL N/A 02/12/2017   Procedure: COLONOSCOPY WITH PROPOFOL;  Surgeon: Jonathon Bellows, MD;  Location: [facility] ENDOSCOPY;  Service: Gastroenterology;  Laterality: N/A;    Prior to Admission medications   Medication Sig Start Date End Date Taking? Authorizing Provider  ibuprofen (ADVIL) 600 MG tablet Take 1 tablet (600 mg total) by mouth every 6  (six) hours as needed. 10/23/20  Yes Arta Silence, MD  traMADol (ULTRAM) 50 MG tablet Take 1 tablet (50 mg total) by mouth every 6 (six) hours as needed for up to 5 days. 10/23/20 10/28/20 Yes Arta Silence, MD  cyanocobalamin (,VITAMIN B-12,) 1000 MCG/ML injection Inject 1000 mcg (1 mL) IM once weekly for 3 weeks and then once monthly 01/19/19   Leone Haven, MD  donepezil (ARICEPT) 10 MG tablet TAKE 1 TABLET BY MOUTH EVERY NIGHT AT BEDTIME 08/20/20   Pieter Partridge, DO  DULoxetine (CYMBALTA) 60 MG capsule Take 1 capsule (60 mg total) by mouth daily. 07/22/20   Tomi Likens, Adam R, DO  famotidine (PEPCID) 20 MG tablet TAKE 2 TABLETS BY MOUTH ONCE DAILY 01/04/20   Jonathon Bellows, MD  memantine (NAMENDA) 10 MG tablet TAKE 1 TABLET BY MOUTH TWICE A DAY. 07/22/20   Tomi Likens, Adam R, DO  nortriptyline (PAMELOR) 10 MG capsule Take 1 capsule at bedtime for one week, then increase to 2 capsules at bedtime. 10/03/20   Pieter Partridge, DO  omeprazole (PRILOSEC) 20 MG capsule TAKE 1 CAPSULE BY MOUTH ONCE A DAY 08/25/18   Leone Haven, MD  ondansetron (ZOFRAN ODT) 4 MG disintegrating tablet Dissolve 1 tablet by mouth every 8 hours as needed for nausea or vomiting. 10/03/20   Pieter Partridge, DO  rizatriptan (MAXALT-MLT) 10 MG disintegrating tablet Take 1 tablet earliest onset of headache.  May repeat in 2 hours if needed.  Maximum 2 tablets in 24 hours. 10/03/20  Tomi Likens, Adam R, DO  rosuvastatin (CRESTOR) 20 MG tablet TAKE 1 TABLET BY MOUTH ONCE A DAY 08/20/20   Leone Haven, MD  SYRINGE-NEEDLE, DISP, 3 ML (B-D 3CC LUER-LOK SYR 25GX1") 25G X 1" 3 ML MISC USE ONCE WEEKLY WITH B12 INJECTION FOR 3 WEEKS, THEN ONCE MONTHLY. 08/04/19   Leone Haven, MD  trandolapril ([place]) 4 MG tablet TAKE 1 TABLET BY MOUTH ONCE A DAY 04/23/20 04/23/21  Leone Haven, MD    Allergies Thiazide-type diuretics, Mushroom extract complex, and Sulfa antibiotics  Family History  Problem Relation Age of Onset   Arthritis  Mother    Heart disease Mother    Hypertension Mother    Diabetes Mother    Arrhythmia Mother        A-Fib    Cancer Father        Lung Cancer   Diabetes Brother    Asthma Daughter    Cancer Paternal Uncle        Prostate cancer   Heart disease Maternal Grandfather    Heart disease Paternal Grandfather     Social History Social History   Tobacco Use   Smoking status: Some Days    Packs/day: 0.05    Years: 30.00    Pack years: 1.50    Types: Cigarettes    Start date: 10/21/1984   Smokeless tobacco: Never  Vaping Use   Vaping Use: Never used  Substance Use Topics   Alcohol use: Not Currently    Alcohol/week: 0.0 standard drinks    Comment: Rare   Drug use: No    Review of Systems  Constitutional: No fever/chills Eyes: No visual changes. ENT: No sore throat. Cardiovascular: Denies chest pain. Respiratory: Denies shortness of breath. Gastrointestinal: No vomiting or diarrhea.  Genitourinary: Positive for dysuria and hesitancy.  Positive for flank pain Musculoskeletal: Positive for back pain. Skin: Negative for rash. Neurological: Negative for headache.   ____________________________________________   PHYSICAL EXAM:  VITAL SIGNS: ED Triage Vitals  Enc Vitals Group     BP 10/23/20 1215 125/86     Pulse Rate 10/23/20 1215 71     Resp 10/23/20 1215 16     Temp 10/23/20 1215 98.7 F (37.1 C)     Temp Source 10/23/20 1215 Oral     SpO2 10/23/20 1215 99 %     Weight 10/23/20 1216 160 lb (72.6 kg)     Height 10/23/20 1216 '5\' 3"'$  (1.6 m)     Head Circumference --      Peak Flow --      Pain Score 10/23/20 1216 10     Pain Loc --      Pain Edu? --      Excl. in [place]? --     Constitutional: Alert and oriented.  Slightly uncomfortable appearing but in no acute distress. Eyes: Conjunctivae are normal.  No scleral icterus. Head: Atraumatic. Nose: No congestion/rhinnorhea. Mouth/Throat: Mucous membranes are moist.   Neck: Normal range of motion.   Cardiovascular: Normal rate, regular rhythm. Good peripheral circulation. Respiratory: Normal respiratory effort.  No retractions.  Gastrointestinal: Soft and nontender. No distention.  Genitourinary: Left CVA tenderness. Musculoskeletal: No lower extremity edema.  Extremities warm and well perfused.  No midline spinal tenderness.  Left lumbar paraspinal muscle tenderness. Neurologic:  Normal speech and language. No gross focal neurologic deficits are appreciated.  Skin:  Skin is warm and dry. No rash noted. Psychiatric: Mood and affect are normal. Speech and behavior are normal.  ____________________________________________   LABS (all labs ordered are listed, but only abnormal results are displayed)  Labs Reviewed  URINALYSIS, COMPLETE (UACMP) WITH MICROSCOPIC - Abnormal; Notable for the following components:      Result Value   Color, Urine YELLOW (*)    APPearance CLEAR (*)    Hgb urine dipstick LARGE (*)    Protein, ur 30 (*)    Bacteria, UA RARE (*)    All other components within normal limits  CBC - Abnormal; Notable for the following components:   WBC 10.9 (*)    All other components within normal limits  BASIC METABOLIC PANEL - Abnormal; Notable for the following components:   Potassium 2.8 (*)    Glucose, Bld 104 (*)    All other components within normal limits   ____________________________________________  EKG   ____________________________________________  RADIOLOGY  CT abdomen/pelvis: Mild left hydronephrosis and hydroureter with no visible stone  ____________________________________________   PROCEDURES  Procedure(s) performed: No  Procedures  Critical Care performed: No ____________________________________________   INITIAL IMPRESSION / ASSESSMENT AND PLAN / ED COURSE  Pertinent labs & imaging results that were available during my care of the patient were reviewed by me and considered in my medical decision making (see chart for details).    55 year old female with PMH as noted above presents with left flank pain over the last 2 days associated with urinary symptoms but no hematuria and no fever.  On exam the patient is overall well-appearing.  Her vital signs are normal.  The physical exam is remarkable for left CVA tenderness as well as some left lumbar muscular tenderness.  The abdomen is soft and nontender.  Neurologic exam is nonfocal and the pain does not radiate to the legs.  Initial lab work-up reveals borderline leukocytosis.  Urinalysis shows RBCs but no WBCs or nitrites, and rare bacteria.  Therefore I have a low suspicion for acute UTI/pyelonephritis.  Presentation is most consistent with ureteral stone versus musculoskeletal low back pain.  We will obtain a CT for further evaluation.  ----------------------------------------- 5:43 PM on 10/23/2020 -----------------------------------------  CT shows mild hydronephrosis and hydroureter on the left with no evidence of a stone, so this is most consistent with a recently passed ureteral stone.  As above, there is no clinical evidence for UTI/pyonephritis.  The patient's pain is well controlled with Toradol.  She is stable for discharge home.  I counseled her on the results of the work-up.  I will prescribe analgesia and have instructed her to follow-up with her PMD.  Return precautions given, and she expresses understanding.  ____________________________________________   FINAL CLINICAL IMPRESSION(S) / ED DIAGNOSES  Final diagnoses:  Flank pain      NEW MEDICATIONS STARTED DURING THIS VISIT:  New Prescriptions   IBUPROFEN (ADVIL) 600 MG TABLET    Take 1 tablet (600 mg total) by mouth every 6 (six) hours as needed.   TRAMADOL (ULTRAM) 50 MG TABLET    Take 1 tablet (50 mg total) by mouth every 6 (six) hours as needed for up to 5 days.     Note:  This document was prepared using Dragon voice recognition software and may include unintentional dictation errors.     Arta Silence, MD 10/23/20 1744

## 2020-10-23 NOTE — Discharge Instructions (Addendum)
Go to the emergency department for evaluation.     

## 2020-10-23 NOTE — ED Notes (Signed)
Patient transported to CT 

## 2020-11-12 NOTE — Telephone Encounter (Signed)
error 

## 2020-11-20 ENCOUNTER — Other Ambulatory Visit: Payer: Self-pay | Admitting: Family Medicine

## 2020-11-20 DIAGNOSIS — E785 Hyperlipidemia, unspecified: Secondary | ICD-10-CM

## 2020-12-13 ENCOUNTER — Ambulatory Visit (INDEPENDENT_AMBULATORY_CARE_PROVIDER_SITE_OTHER): Payer: Self-pay | Admitting: Family Medicine

## 2020-12-13 ENCOUNTER — Encounter: Payer: Self-pay | Admitting: Family Medicine

## 2020-12-13 ENCOUNTER — Other Ambulatory Visit: Payer: Self-pay

## 2020-12-13 VITALS — BP 100/60 | HR 56 | Temp 97.9°F | Ht 63.0 in | Wt 136.0 lb

## 2020-12-13 DIAGNOSIS — F32A Depression, unspecified: Secondary | ICD-10-CM

## 2020-12-13 DIAGNOSIS — K219 Gastro-esophageal reflux disease without esophagitis: Secondary | ICD-10-CM

## 2020-12-13 DIAGNOSIS — R079 Chest pain, unspecified: Secondary | ICD-10-CM

## 2020-12-13 DIAGNOSIS — I10 Essential (primary) hypertension: Secondary | ICD-10-CM

## 2020-12-13 DIAGNOSIS — E876 Hypokalemia: Secondary | ICD-10-CM

## 2020-12-13 DIAGNOSIS — E782 Mixed hyperlipidemia: Secondary | ICD-10-CM

## 2020-12-13 DIAGNOSIS — G300 Alzheimer's disease with early onset: Secondary | ICD-10-CM

## 2020-12-13 DIAGNOSIS — F419 Anxiety disorder, unspecified: Secondary | ICD-10-CM

## 2020-12-13 DIAGNOSIS — F0280 Dementia in other diseases classified elsewhere without behavioral disturbance: Secondary | ICD-10-CM

## 2020-12-13 LAB — HEPATIC FUNCTION PANEL
ALT: 10 U/L (ref 0–35)
AST: 14 U/L (ref 0–37)
Albumin: 4 g/dL (ref 3.5–5.2)
Alkaline Phosphatase: 83 U/L (ref 39–117)
Bilirubin, Direct: 0.1 mg/dL (ref 0.0–0.3)
Total Bilirubin: 0.3 mg/dL (ref 0.2–1.2)
Total Protein: 6.3 g/dL (ref 6.0–8.3)

## 2020-12-13 LAB — LIPID PANEL
Cholesterol: 95 mg/dL (ref 0–200)
HDL: 38.4 mg/dL — ABNORMAL LOW (ref >39.00)
LDL Cholesterol: 30 mg/dL (ref 0–99)
NonHDL: 56.73
Total CHOL/HDL Ratio: 2
Triglycerides: 136 mg/dL (ref 0.0–149.0)
VLDL: 27.2 mg/dL (ref 0.0–40.0)

## 2020-12-13 MED ORDER — POTASSIUM CHLORIDE CRYS ER 20 MEQ PO TBCR: 40.0000 meq | EXTENDED_RELEASE_TABLET | Freq: Every day | ORAL | 0 refills | Status: DC

## 2020-12-13 MED ORDER — OMEPRAZOLE 20 MG PO CPDR: 20.0000 mg | DELAYED_RELEASE_CAPSULE | Freq: Every day | ORAL | 2 refills | Status: DC

## 2020-12-13 MED ORDER — BUSPIRONE HCL 5 MG PO TABS: 5.0000 mg | ORAL_TABLET | Freq: Two times a day (BID) | ORAL | 1 refills | Status: DC

## 2020-12-13 NOTE — Patient Instructions (Signed)
Nice to see you. We will get lab work today. Were going to start her on BuSpar 5 mg twice daily.  You will continue on your Cymbalta.  If you notice any side effects with starting the BuSpar please let us know.  This may take a month or more to become completely beneficial.

## 2020-12-13 NOTE — Assessment & Plan Note (Signed)
Patient has had intermittent issues with this over the last several months.  I advised doing an EKG and referring to cardiology though she declines this given her lack of insurance.  She was advised to monitor for any persistent symptoms and seek medical attention in the emergency department if those occurred.  We will check into patient assistance for the patient so that she can get adequate care while she is uninsured.

## 2020-12-13 NOTE — Assessment & Plan Note (Addendum)
Adequate control.  She will continue trandolapril 4 mg daily.

## 2020-12-13 NOTE — Assessment & Plan Note (Signed)
Noted on labs from the ED after the patient left the office.  I will have the [place] contact the patient to advise her that a potassium supplement was sent to the pharmacy and encouraged that this be rechecked in about a week.

## 2020-12-13 NOTE — Assessment & Plan Note (Addendum)
She will continue to see neurology.  I encouraged her to discuss her reading issues with them.  I have also placed a referral to our care management team to see if there is any assistance for all of her medications.

## 2020-12-13 NOTE — Assessment & Plan Note (Signed)
She notes the prescription Prilosec was more beneficial previously.  I refilled this for her.

## 2020-12-13 NOTE — Progress Notes (Signed)
Tommi Rumps, MD Phone: [phone]  Christina Galvan is a 55 y.o. female who presents today for follow-up.  Hypertension: Patient does check though she does not remember her readings.  She is on trandolapril.  She does note occasional chest tightness when going up hills or stairs.  That has been going on a couple of months.  No shortness of breath or edema.  GERD: Patient reports taking over-the-counter omeprazole which is not beneficial.  She does have some reflux symptoms on this.  No abdominal pain, blood in her stool, or dysphagia.  Anxiety/depression: Patient reports increased stress 5 months ago.  Things started to get worse then.  She occasionally gets angry.  She notes not being able to do much of anything as she does not have a license and cannot get out and do anything.  She has been on Cymbalta.  No SI.  She also has dementia and has difficulty understanding what is on paper.  She follows up with neurology later this month.  Patient reports she does not have insurance.  She has disability through her prior job though did not get Social Security disability as the monthly payment would have been less and she noted she was advised she could not get both.  Social History   Tobacco Use  Smoking Status Some Days   Packs/day: 0.05   Years: 30.00   Pack years: 1.50   Types: Cigarettes   Start date: 10/21/1984  Smokeless Tobacco Never    Current Outpatient Medications on File Prior to Visit  Medication Sig Dispense Refill   cyanocobalamin (,VITAMIN B-12,) 1000 MCG/ML injection Inject 1000 mcg (1 mL) IM once weekly for 3 weeks and then once monthly 10 mL 1   donepezil (ARICEPT) 10 MG tablet TAKE 1 TABLET BY MOUTH EVERY NIGHT AT BEDTIME 30 tablet 3   DULoxetine (CYMBALTA) 60 MG capsule Take 1 capsule (60 mg total) by mouth daily. 30 capsule 5   famotidine (PEPCID) 20 MG tablet TAKE 2 TABLETS BY MOUTH ONCE DAILY 60 tablet 5   ibuprofen (ADVIL) 600 MG tablet Take 1 tablet (600 mg  total) by mouth every 6 (six) hours as needed. 30 tablet 0   memantine (NAMENDA) 10 MG tablet TAKE 1 TABLET BY MOUTH TWICE A DAY. 60 tablet 5   nortriptyline (PAMELOR) 10 MG capsule Take 1 capsule at bedtime for one week, then increase to 2 capsules at bedtime. 60 capsule 0   ondansetron (ZOFRAN ODT) 4 MG disintegrating tablet Dissolve 1 tablet by mouth every 8 hours as needed for nausea or vomiting. 20 tablet 5   rizatriptan (MAXALT-MLT) 10 MG disintegrating tablet Take 1 tablet earliest onset of headache.  May repeat in 2 hours if needed.  Maximum 2 tablets in 24 hours. 10 tablet 5   rosuvastatin (CRESTOR) 20 MG tablet TAKE 1 TABLET BY MOUTH ONCE A DAY 30 tablet 3   SYRINGE-NEEDLE, DISP, 3 ML (B-D 3CC LUER-LOK SYR 25GX1") 25G X 1" 3 ML MISC USE ONCE WEEKLY WITH B12 INJECTION FOR 3 WEEKS, THEN ONCE MONTHLY. 7 each 0   trandolapril (MAVIK) 4 MG tablet TAKE 1 TABLET BY MOUTH ONCE A DAY 90 tablet 0   No current facility-administered medications on file prior to visit.     ROS see history of present illness  Objective  Physical Exam Vitals:   12/13/20 1110  BP: 100/60  Pulse: (!) 56  Temp: 97.9 F (36.6 C)  SpO2: 99%    BP Readings from Last 3  Encounters:  12/13/20 100/60  10/23/20 (!) 127/58  10/23/20 129/74   Wt Readings from Last 3 Encounters:  12/13/20 136 lb (61.7 kg)  10/23/20 160 lb (72.6 kg)  07/01/20 145 lb (65.8 kg)    Physical Exam Constitutional:      General: She is not in acute distress.    Appearance: She is not diaphoretic.  Cardiovascular:     Rate and Rhythm: Normal rate and regular rhythm.     Heart sounds: Normal heart sounds.  Pulmonary:     Effort: Pulmonary effort is normal.     Breath sounds: Normal breath sounds.  Abdominal:     General: Bowel sounds are normal. There is no distension.     Palpations: Abdomen is soft.     Tenderness: There is no abdominal tenderness. There is no guarding or rebound.  Skin:    General: Skin is warm and dry.   Neurological:     Mental Status: She is alert.     Assessment/Plan: Please see individual problem list.  Problem List Items Addressed This Visit     Anxiety and depression - Primary    Worsened.  She will continue Cymbalta 60 mg daily.  We will add on BuSpar 5 mg twice daily.  Discussed it may take some time for this to become beneficial.  She will follow-up in 6 weeks.      Relevant Medications   busPIRone (BUSPAR) 5 MG tablet   Dementia ([place])    She will continue to see neurology.  I encouraged her to discuss her reading issues with them.  I have also placed a referral to our care management team to see if there is any assistance for all of her medications.      Relevant Medications   busPIRone (BUSPAR) 5 MG tablet   Other Relevant Orders   AMB Referral to [facility]   Essential hypertension    Adequate control.  She will continue trandolapril 4 mg daily.       Exertional chest pain    Patient has had intermittent issues with this over the last several months.  I advised doing an EKG and referring to cardiology though she declines this given her lack of insurance.  She was advised to monitor for any persistent symptoms and seek medical attention in the emergency department if those occurred.  We will check into patient assistance for the patient so that she can get adequate care while she is uninsured.      GERD (gastroesophageal reflux disease)    She notes the prescription Prilosec was more beneficial previously.  I refilled this for her.      Relevant Medications   omeprazole (PRILOSEC) 20 MG capsule   Hyperlipidemia   Relevant Orders   Lipid panel   Hepatic function panel   Hypokalemia    Noted on labs from the ED after the patient left the office.  I will have the [facility] contact the patient to advise her that a potassium supplement was sent to the pharmacy and encouraged that this be rechecked in about a week.       Return in about 6 weeks (around  01/24/2021) for Anxiety/depression.  This visit occurred during the SARS-CoV-2 public health emergency.  Safety protocols were in place, including screening questions prior to the visit, additional usage of staff PPE, and extensive cleaning of exam room while observing appropriate contact time as indicated for disinfecting solutions.    Tommi Rumps, MD [facility]  [facility]

## 2020-12-13 NOTE — Assessment & Plan Note (Signed)
Worsened.  She will continue Cymbalta 60 mg daily.  We will add on BuSpar 5 mg twice daily.  Discussed it may take some time for this to become beneficial.  She will follow-up in 6 weeks.

## 2020-12-18 ENCOUNTER — Telehealth: Payer: Self-pay

## 2020-12-18 NOTE — Chronic Care Management (AMB) (Signed)
  Care Management   Note  12/18/2020 Name: Christina Galvan MRN: [id number] DOB: March 04, 1966  Christina Galvan is a 55 y.o. year old female who is a primary care patient of [name], Angela Adam, MD. I reached out to Iver Nestle by phone today in response to a referral sent by [name]'s primary care provider.   [name] was given information about care management services today including:  Care management services include personalized support from designated clinical staff supervised by her physician, including individualized plan of care and coordination with other care providers 24/7 contact phone numbers for assistance for urgent and routine care needs. The patient may stop care management services at any time by phone call to the office staff.  Patient agreed to services and verbal consent obtained.   Follow up plan: Telephone appointment with care management team member scheduled for:01/02/2021  Noreene Larsson, [place], [place], [place] [address] Direct Dial: [phone] [email or web address]

## 2020-12-20 ENCOUNTER — Other Ambulatory Visit: Payer: Self-pay | Admitting: Neurology

## 2020-12-30 NOTE — Progress Notes (Signed)
NEUROLOGY FOLLOW UP OFFICE NOTE  Christina Galvan [id number]  Assessment/Plan:   1.  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 2.  Tension-type headaches 3.  Chronic low back pain with lumbosacral radiculopathy, bilateral 4.  Depression with anxiety.  Donepezil 10mg  at bedtime and memantine 10mg  twice daily For headache and sciatica:  restart gabapentin, titrating to 300mg  three times daily; continue duloxetine 60mg  daily For depression and anxiety, Cymbalta. PCP started buspirone. Follow up 6 months.   Subjective:  Christina Galvan is a 55 year old right-handed female with HTN, chronic low back pain, migraines, depression, anxiety and history of kidney stones who follows up for early-onset Alzheimer's disease/posterior cortical atrophy   UPDATE: Due to worsening headaches, gabapentin was discontinued in July and she was started on nortriptyline to treat both headache and sciatica, however she never got the prescription because it was sent to the wrong pharmacy.  Headaches are moderate and occur 3 days a week.  She lays down to rest for 30 minutes.  Does not treat with analgesics.  Right sciatica is uncontrolled.    She is living with her mother.  Not driving.  Her sister handles her finances.  She is still able to take her medications independently.  Her sister sometimes helps.    Current NSAIDS:  none Current analgesics:  Goodys Current triptans:  none Current ergotamine:  noneics. Current anti-emetic:  none Current muscle relaxants:  none Current anti-anxiolytic:  none Current sleep aide:  none Current Antihypertensive medications:  none Current Antidepressant medications:  Cymbalta 60mg  daily (headache and sciatica) Current Anticonvulsant medications:  none Current anti-CGRP:  none Current Vitamins/Herbal/Supplements:  B12 1036mcg injections monthly Current Antihistamines/Decongestants:  none Other  therapy:  none Hormone/birth control:  none Other medications:  Donepezil 10mg  at bedtime, memantine 10mg  BID   HISTORY: Alzheimer's Dementia with Posterior Cortical Dysfunction:  Since 2020, she has had several medication changes for her anxiety and episodes of confusion.  She was switched from buspirone to sertraline. She was then switched from sertraline to Cymbalta 30mg  in attempt to better treat anxiety and sciatic pain.  She reports worsening symptoms since then.  She describes episodes where she feels like she is in a fog, feeling more angry and sometimes has hallucinations of people walking.  It is typically elicited when she is upset about something.  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  She reports that she sometimes files papers in the wrong chart or will sometimes forget names.  Labs from November 2020 showed TSH 0.63 but also low B12 of 170.  She was started on B12 injections but reports no improvement.  MRI of brain without contrast from 02/07/2019 showed mild chronic small vessel ischemic changes but overall unremarkable.    Around the same time that her symptoms worsened, she reported increased emotional stress.  She had to financially support her son who was in legal trouble, which caused her to lose her home.  She had to move in with her mother.  She underwent neuropsychological testing on 07/13/2019, which demonstrated global and significant impairment which suggests a posterior cortical dysfunction such as early-onset Alzheimer's disease or corticobasal syndrome but did not correlate with Lewy Body Disease.  For further evaluation, she underwent PET scan of brain on 08/22/2019, which demonstrated decreased cortical metabolism in the biparietal lobes and occipital lobes.  She is on long-term disability.  Chronic Tension-type Headaches:  In 2021, she also reported a persistent dull non-throbbing bifrontal headache.  No associated nausea,  vomiting, visual disturbance, numbness or weakness.  Previously took [place] powder daily.  Chronic Low Back Pain with Radiculopathy:  MRI of lumbar spine on 08/30/2018 personally reviewed showed broad-based central disc protrusions at L4-5 causing mild to moderate bilateral lateral recess stenosis potentially affecting either L5 nerve roots, and broad-based central disc protrusion at L5-S1 approximating both S1 nerve roots but no obvious impingement.  Follow up lumbar x-ray on 12/15/2019 personally reviewed was negative.  Back and leg pain aggravated with bilateral leg numbness particularly with prolonged walking and walking up inclines.  Previously took tramadol.   Past NSAIDS:  Ibuprofen, naproxen Past analgesics:  tramadol Past abortive triptans:  none Past abortive ergotamine:  none Past muscle relaxants:  none Past anti-emetic:  none Past antihypertensive medications:  none Past antidepressant medications:  Sertraline 200mg  daily Past anticonvulsant medications:  gabapentin Past anti-CGRP:  none     Past history:  No history of seizures, head trauma  PAST MEDICAL HISTORY: Past Medical History:  Diagnosis Date   Anxiety    Anxiety and depression    Depression with anxiety    GERD (gastroesophageal reflux disease)    History of frequent urinary tract infections    Hypertension    Kidney stones    Migraines     MEDICATIONS: Current Outpatient Medications on File Prior to Visit  Medication Sig Dispense Refill   busPIRone (BUSPAR) 5 MG tablet Take 1 tablet (5 mg total) by mouth 2 (two) times daily. 180 tablet 1   cyanocobalamin (,VITAMIN B-12,) 1000 MCG/ML injection Inject 1000 mcg (1 mL) IM once weekly for 3 weeks and then once monthly 10 mL 1   donepezil (ARICEPT) 10 MG tablet TAKE 1 TABLET BY MOUTH EVERY NIGHT AT BEDTIME 30 tablet 3   DULoxetine (CYMBALTA) 60 MG capsule Take 1 capsule (60 mg total) by mouth daily. 30 capsule 5   famotidine (PEPCID) 20 MG tablet TAKE 2 TABLETS BY  MOUTH ONCE DAILY 60 tablet 5   ibuprofen (ADVIL) 600 MG tablet Take 1 tablet (600 mg total) by mouth every 6 (six) hours as needed. 30 tablet 0   memantine (NAMENDA) 10 MG tablet TAKE 1 TABLET BY MOUTH TWICE A DAY. 60 tablet 5   nortriptyline (PAMELOR) 10 MG capsule Take 1 capsule at bedtime for one week, then increase to 2 capsules at bedtime. 60 capsule 0   omeprazole (PRILOSEC) 20 MG capsule Take 1 capsule (20 mg total) by mouth daily. 30 capsule 2   ondansetron (ZOFRAN ODT) 4 MG disintegrating tablet Dissolve 1 tablet by mouth every 8 hours as needed for nausea or vomiting. 20 tablet 5   potassium chloride SA (KLOR-CON) 20 MEQ tablet Take 2 tablets (40 mEq total) by mouth daily for 3 days. 6 tablet 0   rizatriptan (MAXALT-MLT) 10 MG disintegrating tablet Take 1 tablet earliest onset of headache.  May repeat in 2 hours if needed.  Maximum 2 tablets in 24 hours. 10 tablet 5   rosuvastatin (CRESTOR) 20 MG tablet TAKE 1 TABLET BY MOUTH ONCE A DAY 30 tablet 3   SYRINGE-NEEDLE, DISP, 3 ML (B-D 3CC LUER-LOK SYR 25GX1") 25G X 1" 3 ML MISC USE ONCE WEEKLY WITH B12 INJECTION FOR 3 WEEKS, THEN ONCE MONTHLY. 7 each 0   trandolapril (MAVIK) 4 MG tablet TAKE 1 TABLET BY MOUTH ONCE A DAY 90 tablet 0   No current facility-administered medications on file prior  to visit.    ALLERGIES: Allergies  Allergen Reactions   Thiazide-Type Diuretics     Hypokalemia, Hyponatremia, Chest pain   Mushroom Extract Complex    Sulfa Antibiotics     FAMILY HISTORY: Family History  Problem Relation Age of Onset   Arthritis Mother    Heart disease Mother    Hypertension Mother    Diabetes Mother    Arrhythmia Mother        A-Fib    Cancer Father        Lung Cancer   Diabetes Brother    Asthma Daughter    Cancer Paternal Uncle        Prostate cancer   Heart disease Maternal Grandfather    Heart disease Paternal Grandfather       Objective:  Blood pressure 111/69, pulse 71, height 5\' 3"  (1.6 m), weight 133  lb 12.8 oz (60.7 kg), SpO2 100 %. General: No acute distress.  Patient appears well-groomed.   Head:  Normocephalic/atraumatic Eyes:  Fundi examined but not visualized Neck: supple, no paraspinal tenderness, full range of motion Heart:  Regular rate and rhythm Lungs:  Clear to auscultation bilaterally Back: No paraspinal tenderness Neurological Exam:  [employer] Mental Exam 02/27/2020 12/31/2020  Weekday Correct 1 0  Current year 0 0  What state are we in? 1 1  Amount spent 0 0  Amount left 0 0  # of Animals 2 2  5  objects recall 2 0  Number series 1 0  Hour markers 0 0  Time correct 0 0  Placed X in triangle correctly 1 1  Largest Figure 1 1  Name of female 0 0  Date back to work 0 2  Type of work 0 0  State she lived in 0 0  Total score 9 7   CN II-XII intact. Bulk and tone normal, muscle strength 5/5 throughout.  Sensation to light touch intact.  Deep tendon reflexes 2+ throughout,.  Finger to nose testing intact.  Gait normal, Romberg negative.   Metta Clines, DO  CC: Tommi Rumps, MD

## 2020-12-31 ENCOUNTER — Ambulatory Visit (INDEPENDENT_AMBULATORY_CARE_PROVIDER_SITE_OTHER): Payer: Self-pay | Admitting: Neurology

## 2020-12-31 ENCOUNTER — Other Ambulatory Visit: Payer: Self-pay | Admitting: Neurology

## 2020-12-31 ENCOUNTER — Other Ambulatory Visit: Payer: Self-pay

## 2020-12-31 ENCOUNTER — Encounter: Payer: Self-pay | Admitting: Neurology

## 2020-12-31 VITALS — BP 111/69 | HR 71 | Ht 63.0 in | Wt 133.8 lb

## 2020-12-31 DIAGNOSIS — M5441 Lumbago with sciatica, right side: Secondary | ICD-10-CM

## 2020-12-31 DIAGNOSIS — G8929 Other chronic pain: Secondary | ICD-10-CM

## 2020-12-31 DIAGNOSIS — G309 Alzheimer's disease, unspecified: Secondary | ICD-10-CM

## 2020-12-31 DIAGNOSIS — G44221 Chronic tension-type headache, intractable: Secondary | ICD-10-CM

## 2020-12-31 DIAGNOSIS — F0280 Dementia in other diseases classified elsewhere without behavioral disturbance: Secondary | ICD-10-CM

## 2020-12-31 MED ORDER — DONEPEZIL HCL 10 MG PO TABS: 10.0000 mg | ORAL_TABLET | Freq: Every day | ORAL | 5 refills | Status: DC

## 2020-12-31 MED ORDER — GABAPENTIN 100 MG PO CAPS: ORAL_CAPSULE | ORAL | 0 refills | Status: DC

## 2020-12-31 MED ORDER — MEMANTINE HCL 10 MG PO TABS: ORAL_TABLET | ORAL | 5 refills | Status: DC

## 2020-12-31 NOTE — Patient Instructions (Signed)
Take gabapentin 100mg  - take 1 pill three times daily for 7 days   Then 2 pills three times daily for 7 days   Then 3 pills three times daily 2. Donepezil 10mg  at bedtime and memantine 10mg  twice daily 3. Follow up 6 months.

## 2021-01-02 ENCOUNTER — Ambulatory Visit: Payer: Self-pay | Admitting: Pharmacist

## 2021-01-02 DIAGNOSIS — I10 Essential (primary) hypertension: Secondary | ICD-10-CM

## 2021-01-02 DIAGNOSIS — F0280 Dementia in other diseases classified elsewhere without behavioral disturbance: Secondary | ICD-10-CM

## 2021-01-02 DIAGNOSIS — F419 Anxiety disorder, unspecified: Secondary | ICD-10-CM

## 2021-01-02 DIAGNOSIS — G300 Alzheimer's disease with early onset: Secondary | ICD-10-CM

## 2021-01-02 DIAGNOSIS — E782 Mixed hyperlipidemia: Secondary | ICD-10-CM

## 2021-01-02 NOTE — Patient Instructions (Signed)
Visit Information   Goals Addressed               This Visit's Progress     Patient Stated     Medication Access (pt-stated)         Patient Goals/Self-Care Activities patient will:  - check blood pressure daily, document, and provide at future appointments collaborate with provider on medication access solutions        Patient verbalizes understanding of instructions provided today and agrees to view in [place].    Plan: Telephone follow up appointment with care management team member scheduled for:  pending medication access plan  Catie Darnelle Maffucci, PharmD, [place], [place] Clinical Pharmacist [employer] at [facility] [phone]

## 2021-01-02 NOTE — Chronic Care Management (AMB) (Signed)
Chronic Care Management Pharmacy Note  01/06/2021 Name:  Christina Galvan MRN:  [id number] DOB:  02/25/1966  Subjective: Christina Galvan is an 55 y.o. year old female who is a primary patient of [name], Angela Adam, MD.  The CCM team was consulted for assistance with disease management and care coordination needs.    Engaged with patient by telephone for initial visit for pharmacy case management and/or care coordination services.   Objective:  Medications Reviewed Today     Reviewed by De Hollingshead, RPH-CPP (Pharmacist) on 01/02/21 at [place] List Status: <None>   Medication Order Taking? Sig Documenting Provider Last Dose Status Informant  busPIRone (BUSPAR) 5 MG tablet [id number] Yes Take 1 tablet (5 mg total) by mouth 2 (two) times daily. Leone Haven, MD Taking Active   cyanocobalamin (,VITAMIN B-12,) 1000 MCG/ML injection [id number] No Inject 1000 mcg (1 mL) IM once weekly for 3 weeks and then once monthly  Patient not taking: No sig reported   Leone Haven, MD Not Taking Active   donepezil (ARICEPT) 10 MG tablet [id number] Yes Take 1 tablet (10 mg total) by mouth at bedtime. Pieter Partridge, DO Taking Active   DULoxetine (CYMBALTA) 60 MG capsule [id number] Yes Take 1 capsule (60 mg total) by mouth daily. Pieter Partridge, DO Taking Active   famotidine (PEPCID) 20 MG tablet [id number] No TAKE 2 TABLETS BY MOUTH ONCE DAILY  Patient not taking: Reported on 01/02/2021   Jonathon Bellows, MD Not Taking Active   gabapentin (NEURONTIN) 100 MG capsule [id number] Yes Take 1 capsule three times daily for 7 days, then 2 capsules three times daily for 7 days, then 3 capsules three times daily Pieter Partridge, DO Taking Active   ibuprofen (ADVIL) 600 MG tablet [id number] Yes Take 1 tablet (600 mg total) by mouth every 6 (six) hours as needed. Arta Silence, MD Taking Active   memantine (NAMENDA) 10 MG tablet [id number] Yes TAKE 1 TABLET BY MOUTH TWICE A DAY. Pieter Partridge, DO Taking  Active   omeprazole (PRILOSEC) 20 MG capsule [id number] Yes Take 1 capsule (20 mg total) by mouth daily. Leone Haven, MD Taking Active   ondansetron (ZOFRAN ODT) 4 MG disintegrating tablet [id number] No Dissolve 1 tablet by mouth every 8 hours as needed for nausea or vomiting.  Patient not taking: No sig reported   Pieter Partridge, DO Not Taking Active   rizatriptan (MAXALT-MLT) 10 MG disintegrating tablet [id number] No Take 1 tablet earliest onset of headache.  May repeat in 2 hours if needed.  Maximum 2 tablets in 24 hours.  Patient not taking: No sig reported   Pieter Partridge, DO Not Taking Active   rosuvastatin (CRESTOR) 20 MG tablet [id number] Yes TAKE 1 TABLET BY MOUTH ONCE A DAY Sonnenberg, Angela Adam, MD Taking Active   SYRINGE-NEEDLE, DISP, 3 ML (B-D 3CC LUER-LOK SYR 25GX1") 25G X 1" 3 ML MISC [id number] No USE ONCE WEEKLY WITH B12 INJECTION FOR 3 WEEKS, THEN ONCE MONTHLY.  Patient not taking: Reported on 01/02/2021   Leone Haven, MD Not Taking Active   trandolapril ([place]) 4 MG tablet [id number] Yes TAKE 1 TABLET BY MOUTH ONCE A DAY Leone Haven, MD Taking Active            Lab Results  Component Value Date   CREATININE 0.53 10/23/2020   BUN 14 10/23/2020   NA 139 10/23/2020   K 2.8 (L) 10/23/2020   CL 103  10/23/2020   CO2 28 10/23/2020   Lab Results  Component Value Date   ALT 10 12/13/2020   AST 14 12/13/2020   ALKPHOS 83 12/13/2020   BILITOT 0.3 12/13/2020     Assessment/Interventions: Review of patient past medical history, allergies, medications, health status, including review of consultants reports, laboratory and other test data, was performed as part of comprehensive evaluation and provision of chronic care management services.   SDOH:  (Social Determinants of Health) assessments and interventions performed: Yes SDOH Interventions    Flowsheet Row Most Recent Value  SDOH Interventions   Financial Strain Interventions Other (Comment)  [medication  management clinic]        CCM Care Plan  Review of patient past medical history, allergies, medications, health status, including review of consultants reports, laboratory and other test data, was performed as part of comprehensive evaluation and provision of chronic care management services.   Conditions to be addressed/monitored:  Hypertension and Hyperlipidemia  Care Plan : Medication Management  Updates made by De Hollingshead, RPH-CPP since 01/06/2021 12:00 AM     Problem: Medication Access      Long-Range Goal: Disease Progression Prevention   Start Date: 01/02/2021  This Visit's Progress: On track  Priority: High  Note:   Current Barriers:  Unable to independently afford treatment regimen  Pharmacist Clinical Goal(s):  patient will verbalize ability to afford treatment regimen through collaboration with PharmD and provider.   Interventions: 1:1 collaboration with Leone Haven, MD regarding development and update of comprehensive plan of care as evidenced by provider attestation and co-signature Inter-disciplinary care team collaboration (see longitudinal plan of care) Comprehensive medication review performed; medication list updated in electronic medical record  SDOH: Reports she is uninsured with disability income. Difficult time affording medications, medical expenses. Had license taken away a while ago so cannot drive, transportation is a barrier.  Will collaborate with [facility].  Hypertension:   Controlled; current treatment: trandolapril 4 mg daily Medication access as above. Medication Management Clinic cannot supply trandolapril. Attempted to call patient to discuss switch to a different ACEi, left voicemail. Will send MyChart.  Depression/Anxiety/Migraine with Dementia:   Moderately well controlled; current treatment: buspirone 5 mg BID, duloxetine 60 mg daily, donepezil 10 mg daily, memantine 10 mg BID, gabapentin 100 mg w/  titration; reports she does not have rizatriptan or ondansetron as they were sent to [facility].  Also reports she has been off Vitamin B12 for years.  Medication access as above. Medication Management Clinic reports they do not have rizatriptan but can do sumatriptan. Attempted to call patient to discuss, left voicemail. Will send MyChart  Patient Goals/Self-Care Activities patient will:  - check blood pressure daily, document, and provide at future appointments collaborate with provider on medication access solutions  Follow Up Plan: Telephone follow up appointment with care management team member scheduled for: pending medication access plan       Plan: Telephone follow up appointment with care management team member scheduled for:  pending medication access plan  Catie Darnelle Maffucci, PharmD, [place], [place] Pharmacist [employer] at [facility] [phone]

## 2021-01-06 ENCOUNTER — Telehealth: Payer: Self-pay | Admitting: Pharmacy Technician

## 2021-01-06 NOTE — Telephone Encounter (Signed)
Called patient to discuss obtaining medication assistance from [facility].  Patient concerned about transportation.  Informed patient that [facility] would contact [place] and arrange transportation.  Patient stated that her sister takes care of all of her needs.  Patient wants to talk with sister before pursuing [facility]'s services.  [facility]

## 2021-01-10 ENCOUNTER — Telehealth: Payer: Self-pay

## 2021-01-10 NOTE — Telephone Encounter (Signed)
   Telephone encounter was:  Unsuccessful.  01/10/2021 Name: Christina Galvan MRN: [id number] DOB: 1965-04-10  Unsuccessful outbound call made today to assist with:  Left message on voicemail for patient to return my call regarding assistance with medical bills and transportation.  Outreach Attempt:  1st Attempt  A HIPAA compliant voice message was left requesting a return call.  Instructed patient to call back at [phone].  Jodie Cavey, AAS Paralegal, [facility] [facility]  [address], [place] [address] ??[email or web address]  ?? [phone]   [email or web address]

## 2021-01-20 ENCOUNTER — Other Ambulatory Visit: Payer: Self-pay | Admitting: Neurology

## 2021-01-20 ENCOUNTER — Other Ambulatory Visit: Payer: Self-pay | Admitting: Family Medicine

## 2021-01-21 ENCOUNTER — Telehealth: Payer: Self-pay | Admitting: Pharmacist

## 2021-01-21 NOTE — Telephone Encounter (Signed)
Attempted to contact patient to follow up on if she has decided to pursue working with [facility] for medication access. Left voicemail for her to return my call at her convenience.

## 2021-01-24 NOTE — Telephone Encounter (Signed)
Attempted to call patient to discuss below. Left voicemail.

## 2021-02-04 ENCOUNTER — Telehealth: Payer: Self-pay

## 2021-02-04 ENCOUNTER — Other Ambulatory Visit: Payer: Self-pay | Admitting: Neurology

## 2021-02-04 NOTE — Telephone Encounter (Signed)
   Telephone encounter was:  Unsuccessful.  02/04/2021 Name: Christina Galvan MRN: [id number] DOB: 05-Sep-1965  Unsuccessful outbound call made today to assist with:  Transportation Needs  and medical bills  Outreach Attempt:  2nd Attempt  A HIPAA compliant voice message was left requesting a return call.  Instructed patient to call back at [phone].  Orton Capell, AAS Paralegal, [place] [facility]  [address] [place], [place] [address] ??[email or web address]  ?? [phone]   [email or web address]

## 2021-02-05 ENCOUNTER — Telehealth: Payer: Self-pay

## 2021-02-05 NOTE — Telephone Encounter (Signed)
   Telephone encounter was:  Successful.  02/05/2021 Name: [name] MRN: [id number] DOB: Sep 23, 1965  Christina LUO is a 55 y.o. year old female who is a primary care patient of [name], Angela Adam, MD . The community resource team was consulted for assistance with Transportation Needs  and medical bills per referral.  Care guide performed the following interventions: Spoke with patient she asked that I call her daughter [place]. She stated that she has trouble remembering and recall what she discussed with Catie Darnelle Maffucci.  Follow Up Plan:   Per patient request I will follow-up with her daughter. She suggested I call her in the am.  Jewett Mcgann, AAS Paralegal, [facility] [facility]  [address], [place] [address] ??[email or web address]  ?? [phone]   [email or web address]

## 2021-02-06 ENCOUNTER — Telehealth: Payer: Self-pay

## 2021-02-06 NOTE — Telephone Encounter (Signed)
   Telephone encounter was:  Unsuccessful.  02/06/2021 Name: Christina Galvan MRN: [id number] DOB: 1965/08/12  Unsuccessful outbound call made today to assist with:  Transportation Needs  and medical bills  Outreach Attempt:  3rd Attempt.  Referral closed unable to contact patient.  A HIPAA compliant voice message was left requesting a return call.  Instructed patient to call back at [phone].  Tamy Accardo, AAS Paralegal, [facility]  [address], [place] [address] ??[email or web address]  ?? [phone]   [email or web address]

## 2021-02-07 ENCOUNTER — Other Ambulatory Visit: Payer: Self-pay

## 2021-02-07 ENCOUNTER — Telehealth (INDEPENDENT_AMBULATORY_CARE_PROVIDER_SITE_OTHER): Payer: Self-pay | Admitting: Family Medicine

## 2021-02-07 ENCOUNTER — Encounter: Payer: Self-pay | Admitting: Family Medicine

## 2021-02-07 DIAGNOSIS — F32A Depression, unspecified: Secondary | ICD-10-CM

## 2021-02-07 DIAGNOSIS — F419 Anxiety disorder, unspecified: Secondary | ICD-10-CM

## 2021-02-07 DIAGNOSIS — G44229 Chronic tension-type headache, not intractable: Secondary | ICD-10-CM

## 2021-02-07 DIAGNOSIS — F0280 Dementia in other diseases classified elsewhere without behavioral disturbance: Secondary | ICD-10-CM

## 2021-02-07 DIAGNOSIS — G300 Alzheimer's disease with early onset: Secondary | ICD-10-CM

## 2021-02-07 MED ORDER — BUSPIRONE HCL 10 MG PO TABS: 10.0000 mg | ORAL_TABLET | Freq: Two times a day (BID) | ORAL | 3 refills | Status: DC

## 2021-02-07 MED ORDER — GABAPENTIN 100 MG PO CAPS: ORAL_CAPSULE | ORAL | 0 refills | Status: DC

## 2021-02-07 NOTE — Assessment & Plan Note (Addendum)
Chronic headache issues.  These were adequately controlled by gabapentin previously.  We will refill this for her to progressively increase the dose as prescribed.  Discussed that she would need to continue on this once she reached the end of her dose escalation.  She will monitor for drowsiness with restarting the gabapentin.

## 2021-02-07 NOTE — Assessment & Plan Note (Signed)
Generally stable.  She will continue her current regimen through neurology.

## 2021-02-07 NOTE — Progress Notes (Signed)
Virtual Visit via telephone Note  This visit type was conducted due to national recommendations for restrictions regarding the COVID-19 pandemic (e.g. social distancing).  This format is felt to be most appropriate for this patient at this time.  All issues noted in this document were discussed and addressed.  No physical exam was performed (except for noted visual exam findings with Video Visits).   I connected with Christina Galvan today at 10:30 AM EST by telephone and verified that I am speaking with the correct person using two identifiers. Location patient: home Location provider: home office Persons participating in the virtual visit: patient, provider  I discussed the limitations, risks, security and privacy concerns of performing an evaluation and management service by telephone and the availability of in person appointments. I also discussed with the patient that there may be a patient responsible charge related to this service. The patient expressed understanding and agreed to proceed.  Interactive audio and video telecommunications were attempted between this provider and patient, however failed, due to patient having technical difficulties OR patient did not have access to video capability.  We continued and completed visit with audio only.   Reason for visit: f/u  HPI: Anxiety/depression: Patient notes this has gotten worse.  She has a lot going on where she is at currently.  She has a lot of stress.  She has been on Cymbalta though notes she has been out of this for about a week.  She has also been taking BuSpar 5 mg twice daily.  No SI or HI.  Headache/sciatica: Patient questioned why she was placed on gabapentin previously and why the prescription was not continued.  We discussed that Dr. Tomi Likens started her on this for headaches and sciatica.  The patient notes that this was quite beneficial though the prescription was not continued when she finished the dose escalation.  She  noted no drowsiness with this.  Dementia: Patient notes she cannot read anymore.  Generally this is otherwise stable.  She is on Aricept and Namenda.  ROS: See pertinent positives and negatives per HPI.  Past Medical History:  Diagnosis Date   Anxiety    Anxiety and depression    Depression with anxiety    GERD (gastroesophageal reflux disease)    History of frequent urinary tract infections    Hypertension    Kidney stones    Migraines     Past Surgical History:  Procedure Laterality Date   carpal tunnel     [place]   COLONOSCOPY WITH PROPOFOL N/A 02/12/2017   Procedure: COLONOSCOPY WITH PROPOFOL;  Surgeon: Jonathon Bellows, MD;  Location: [facility] ENDOSCOPY;  Service: Gastroenterology;  Laterality: N/A;    Family History  Problem Relation Age of Onset   Arthritis Mother    Heart disease Mother    Hypertension Mother    Diabetes Mother    Arrhythmia Mother        A-Fib    Cancer Father        Lung Cancer   Diabetes Brother    Asthma Daughter    Cancer Paternal Uncle        Prostate cancer   Heart disease Maternal Grandfather    Heart disease Paternal Grandfather     SOCIAL HX: Smoker   Current Outpatient Medications:    cyanocobalamin (,VITAMIN B-12,) 1000 MCG/ML injection, Inject 1000 mcg (1 mL) IM once weekly for 3 weeks and then once monthly, Disp: 10 mL, Rfl:  1   donepezil (ARICEPT) 10 MG tablet, Take 1 tablet (10 mg total) by mouth at bedtime., Disp: 30 tablet, Rfl: 5   DULoxetine (CYMBALTA) 60 MG capsule, TAKE 1 CAPSULE BY MOUTH ONCE DAILY. PLEASE MAKE A FOLLOW UP APPOINTMENT FOR REFILLS, Disp: 30 capsule, Rfl: 5   famotidine (PEPCID) 20 MG tablet, TAKE 2 TABLETS BY MOUTH ONCE DAILY, Disp: 60 tablet, Rfl: 5   ibuprofen (ADVIL) 600 MG tablet, Take 1 tablet (600 mg total) by mouth every 6 (six) hours as needed., Disp: 30 tablet, Rfl: 0   memantine (NAMENDA) 10 MG tablet, TAKE 1 TABLET BY MOUTH TWICE A DAY., Disp: 60 tablet, Rfl:  5   omeprazole (PRILOSEC) 20 MG capsule, Take 1 capsule (20 mg total) by mouth daily., Disp: 30 capsule, Rfl: 2   ondansetron (ZOFRAN ODT) 4 MG disintegrating tablet, Dissolve 1 tablet by mouth every 8 hours as needed for nausea or vomiting., Disp: 20 tablet, Rfl: 5   rizatriptan (MAXALT-MLT) 10 MG disintegrating tablet, Take 1 tablet earliest onset of headache.  May repeat in 2 hours if needed.  Maximum 2 tablets in 24 hours., Disp: 10 tablet, Rfl: 5   rosuvastatin (CRESTOR) 20 MG tablet, TAKE 1 TABLET BY MOUTH ONCE A DAY, Disp: 30 tablet, Rfl: 3   SYRINGE-NEEDLE, DISP, 3 ML (B-D 3CC LUER-LOK SYR 25GX1") 25G X 1" 3 ML MISC, USE ONCE WEEKLY WITH B12 INJECTION FOR 3 WEEKS, THEN ONCE MONTHLY., Disp: 7 each, Rfl: 0   trandolapril (MAVIK) 4 MG tablet, TAKE 1 TABLET BY MOUTH ONCE A DAY, Disp: 90 tablet, Rfl: 0   busPIRone (BUSPAR) 10 MG tablet, Take 1 tablet (10 mg total) by mouth 2 (two) times daily., Disp: 60 tablet, Rfl: 3   gabapentin (NEURONTIN) 100 MG capsule, Take 1 capsule three times daily for 7 days, then 2 capsules three times daily for 7 days, then 3 capsules three times daily.  Call the office for refill when you get close to running out., Disp: 270 capsule, Rfl: 0  EXAM: This was a telephone visit and thus no exam was completed.  ASSESSMENT AND PLAN:  Discussed the following assessment and plan:  Problem List Items Addressed This Visit     Anxiety and depression    Worsening.  We will increase her BuSpar to 10 mg twice daily.  She will check at the pharmacy for her refill of Cymbalta.  I discussed that restarting the gabapentin may be beneficial as well.  She was advised to seek medical attention in the emergency department if she developed SI or HI.      Relevant Medications   busPIRone (BUSPAR) 10 MG tablet   Dementia (HCC)    Generally stable.  She will continue her current regimen through neurology.      Relevant Medications   busPIRone (BUSPAR) 10 MG tablet   gabapentin  (NEURONTIN) 100 MG capsule   HA (headache)    Chronic headache issues.  These were adequately controlled by gabapentin previously.  We will refill this for her to progressively increase the dose as prescribed.  Discussed that she would need to continue on this once she reached the end of her dose escalation.  She will monitor for drowsiness with restarting the gabapentin.      Relevant Medications   gabapentin (NEURONTIN) 100 MG capsule    Return in about 3 months (around 05/08/2021).   I discussed the assessment and treatment plan with the patient. The patient was provided an opportunity to  ask questions and all were answered. The patient agreed with the plan and demonstrated an understanding of the instructions.   The patient was advised to call back or seek an in-person evaluation if the symptoms worsen or if the condition fails to improve as anticipated.  I provided 15 minutes of non-face-to-face time during this encounter.   Tommi Rumps, MD

## 2021-02-07 NOTE — Assessment & Plan Note (Signed)
Worsening.  We will increase her BuSpar to 10 mg twice daily.  She will check at the pharmacy for her refill of Cymbalta.  I discussed that restarting the gabapentin may be beneficial as well.  She was advised to seek medical attention in the emergency department if she developed SI or HI.

## 2021-03-21 ENCOUNTER — Other Ambulatory Visit: Payer: Self-pay | Admitting: Family Medicine

## 2021-03-21 DIAGNOSIS — E785 Hyperlipidemia, unspecified: Secondary | ICD-10-CM

## 2021-04-16 ENCOUNTER — Other Ambulatory Visit: Payer: Self-pay | Admitting: Family Medicine

## 2021-04-18 ENCOUNTER — Other Ambulatory Visit: Payer: Self-pay | Admitting: Family Medicine

## 2021-05-13 ENCOUNTER — Ambulatory Visit: Payer: Self-pay | Admitting: Pharmacist

## 2021-05-13 NOTE — Chronic Care Management (AMB) (Signed)
?  Chronic Care Management  ? ?Note ? ?05/13/2021 ?Name: Christina Galvan MRN: [id number] DOB: 11/18/1965 ? ? ? ?Closing pharmacy CCM case at this time. Patient has clinic contact information for future questions or concerns.  ? ?Catie Darnelle Maffucci, PharmD, [place], CPP ?Clinical Pharmacist ?Therapist, music at [employer] ?[phone] ? ?

## 2021-06-18 ENCOUNTER — Other Ambulatory Visit: Payer: Self-pay | Admitting: Neurology

## 2021-07-18 ENCOUNTER — Other Ambulatory Visit: Payer: Self-pay | Admitting: Family Medicine

## 2021-07-18 ENCOUNTER — Other Ambulatory Visit: Payer: Self-pay | Admitting: Neurology

## 2021-07-18 DIAGNOSIS — G44229 Chronic tension-type headache, not intractable: Secondary | ICD-10-CM

## 2021-07-18 DIAGNOSIS — E785 Hyperlipidemia, unspecified: Secondary | ICD-10-CM

## 2021-07-18 NOTE — Progress Notes (Addendum)
? ?NEUROLOGY FOLLOW UP OFFICE NOTE ? ?Christina Galvan ?[id number] ? ?Assessment/Plan:  ? ?1.  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 ?2.  Tension-type headaches ?3.  Chronic low back pain with lumbosacral radiculopathy, bilateral ?4.  Depression with anxiety. ?5.  B12 deficiency ?  ?Donepezil '10mg'$  at bedtime and memantine '10mg'$  twice daily ?For headache and sciatica:  restart gabapentin, titrating to '300mg'$  three times daily; continue duloxetine '60mg'$  daily ?For depression and anxiety, Cymbalta. ?History of B12 deficiency.  Will repeat level. ?Follow up 6 months. ?Discussed referral to memory clinic at [facility] to explore any other treatment options/trials.  Patient declines at this time. ?  ?  ?Subjective:  ?Christina Galvan is a 56 year old right-handed female with HTN, chronic low back pain, migraines, depression, anxiety and history of kidney stones who follows up for early-onset Alzheimer's disease/posterior cortical atrophy ?  ?UPDATE: ?Last visit, restarted gabapentin to great headache and sciatica.  She is no longer taking it.  Not sure why.  Her sister managed her medications.  She gets one headache a week and lasts in a couple of hours.  One time it lasted 8 hours.   ?  ?She is living with her mother.  Not driving.  Her sister handles her finances.  She is still able to take her medications independently.  Her sister sometimes helps.  ?  ?Current NSAIDS:  none ?Current analgesics:  Goodys ?Current triptans:  none ?Current ergotamine:  none. ?Current anti-emetic:  Zofran ?Current muscle relaxants:  none ?Current anti-anxiolytic:  none ?Current sleep aide:  none ?Current Antihypertensive medications:  none ?Current Antidepressant medications:  Cymbalta '60mg'$  daily (headache and sciatica) ?Current Anticonvulsant medications:  none ?Current anti-CGRP:  none ?Current Vitamins/Herbal/Supplements:  B12 1032mg injections monthly ?Current  Antihistamines/Decongestants:  none ?Other therapy:  none ?Hormone/birth control:  none ?Other medications:  Donepezil '10mg'$  at bedtime, memantine '10mg'$  BID ?  ?HISTORY: ?Alzheimer's Dementia with Posterior Cortical Dysfunction:  Since 2020, she has had several medication changes for her anxiety and episodes of confusion.  She was switched from buspirone to sertraline. She was then switched from sertraline to Cymbalta '30mg'$  in attempt to better treat anxiety and sciatic pain.  She reports worsening symptoms since then.  She describes episodes where she feels like she is in a fog, feeling more angry and sometimes has hallucinations of people walking.  It is typically elicited when she is upset about something.  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  Labs from November 2020 showed TSH 0.63 but also low B12 of 170.  She was started on B12 injections but reports no improvement.  MRI of brain without contrast from 02/07/2019 showed mild chronic small vessel ischemic changes but overall unremarkable.    Around the same time that her symptoms worsened, she reported increased emotional stress.  She had to financially support her son who was in legal trouble, which caused her to lose her home.  She had to move in with her mother.  She underwent neuropsychological testing on 07/13/2019, which demonstrated global and significant impairment which suggests a posterior cortical dysfunction such as early-onset Alzheimer's disease or corticobasal syndrome but did not correlate with Lewy Body Disease.  For further evaluation, she underwent PET scan of brain on 08/22/2019, which demonstrated decreased cortical metabolism in the biparietal lobes  and occipital lobes.  She is on long-term disability. ?  ?Chronic Tension-type Headaches:  In 2021, she also reported a persistent dull  non-throbbing bifrontal headache.  No associated nausea, vomiting, visual disturbance, numbness or weakness.  Previously took [place] powder daily. ?  ?Chronic Low Back Pain with Radiculopathy:  MRI of lumbar spine on 08/30/2018 personally reviewed showed broad-based central disc protrusions at L4-5 causing mild to moderate bilateral lateral recess stenosis potentially affecting either L5 nerve roots, and broad-based central disc protrusion at L5-S1 approximating both S1 nerve roots but no obvious impingement.  Follow up lumbar x-ray on 12/15/2019 personally reviewed was negative.  Back and leg pain aggravated with bilateral leg numbness particularly with prolonged walking and walking up inclines.  Previously took tramadol. ?  ?Past NSAIDS:  Ibuprofen, naproxen ?Past analgesics:  tramadol ?Past abortive triptans:  none ?Past abortive ergotamine:  none ?Past muscle relaxants:  none ?Past anti-emetic:  none ?Past antihypertensive medications:  none ?Past antidepressant medications:  Sertraline '200mg'$  daily ?Past anticonvulsant medications: gabapentin ?Past anti-CGRP:  none ?  ?  ?Past history:  No history of seizures, head trauma ? ?PAST MEDICAL HISTORY: ?Past Medical History:  ?Diagnosis Date  ? Anxiety   ? Anxiety and depression   ? Depression with anxiety   ? GERD (gastroesophageal reflux disease)   ? History of frequent urinary tract infections   ? Hypertension   ? Kidney stones   ? Migraines   ? ? ?MEDICATIONS: ?Current Outpatient Medications on File Prior to Visit  ?Medication Sig Dispense Refill  ? busPIRone (BUSPAR) 10 MG tablet Take 1 tablet (10 mg total) by mouth 2 (two) times daily. 60 tablet 3  ? cyanocobalamin (,VITAMIN B-12,) 1000 MCG/ML injection Inject 1000 mcg (1 mL) IM once weekly for 3 weeks and then once monthly 10 mL 1  ? donepezil (ARICEPT) 10 MG tablet TAKE 1 TABLET BY MOUTH EVERY NIGHT AT BEDTIME 30 tablet 0  ? DULoxetine (CYMBALTA) 60 MG capsule TAKE 1 CAPSULE BY MOUTH ONCE DAILY. PLEASE MAKE A  FOLLOW UP APPOINTMENT FOR REFILLS 30 capsule 5  ? famotidine (PEPCID) 20 MG tablet TAKE 2 TABLETS BY MOUTH ONCE DAILY 60 tablet 5  ? gabapentin (NEURONTIN) 100 MG capsule Take 1 capsule three times daily for 7 days, then 2 capsules three times daily for 7 days, then 3 capsules three times daily.  Call the office for refill when you get close to running out. 270 capsule 0  ? ibuprofen (ADVIL) 600 MG tablet Take 1 tablet (600 mg total) by mouth every 6 (six) hours as needed. 30 tablet 0  ? memantine (NAMENDA) 10 MG tablet TAKE 1 TABLET BY MOUTH TWICE A DAY. 60 tablet 5  ? omeprazole (PRILOSEC) 20 MG capsule TAKE 1 CAPSULE BY MOUTH ONCE DAILY 30 capsule 2  ? ondansetron (ZOFRAN ODT) 4 MG disintegrating tablet Dissolve 1 tablet by mouth every 8 hours as needed for nausea or vomiting. 20 tablet 5  ? rizatriptan (MAXALT-MLT) 10 MG disintegrating tablet Take 1 tablet earliest onset of headache.  May repeat in 2 hours if needed.  Maximum 2 tablets in 24 hours. 10 tablet 5  ? rosuvastatin (CRESTOR) 20 MG tablet TAKE 1 TABLET BY MOUTH ONCE A DAY 30 tablet 3  ? SYRINGE-NEEDLE, DISP, 3 ML (B-D 3CC LUER-LOK SYR 25GX1") 25G X 1" 3 ML MISC USE ONCE WEEKLY WITH B12 INJECTION FOR 3 WEEKS, THEN ONCE MONTHLY. 7 each 0  ? trandolapril (MAVIK) 4 MG tablet TAKE 1 TABLET  BY MOUTH ONCE A DAY 90 tablet 0  ? ?No current facility-administered medications on file prior to visit.  ? ? ?ALLERGIES: ?Allergies  ?Allergen Reactions  ? Thiazide-Type Diuretics   ?  Hypokalemia, Hyponatremia, Chest pain  ? Mushroom Extract Complex   ? Sulfa Antibiotics   ? ? ?FAMILY HISTORY: ?Family History  ?Problem Relation Age of Onset  ? Arthritis Mother   ? Heart disease Mother   ? Hypertension Mother   ? Diabetes Mother   ? Arrhythmia Mother   ?     A-Fib   ? Cancer Father   ?     Lung Cancer  ? Diabetes Brother   ? Asthma Daughter   ? Cancer Paternal Uncle   ?     Prostate cancer  ? Heart disease Maternal Grandfather   ? Heart disease Paternal Grandfather    ? ? ?  ?Objective:  ?Blood pressure 109/64, pulse 78, height '5\' 3"'$  (1.6 m), weight 124 lb 6.4 oz (56.4 kg), SpO2 98 %. ?General: No acute distress.  Patient appears well-groomed.   ?Head:  Normocephalic/atraumatic ?Eyes:  Fundi e

## 2021-07-21 ENCOUNTER — Other Ambulatory Visit (INDEPENDENT_AMBULATORY_CARE_PROVIDER_SITE_OTHER): Payer: Self-pay

## 2021-07-21 ENCOUNTER — Ambulatory Visit (INDEPENDENT_AMBULATORY_CARE_PROVIDER_SITE_OTHER): Payer: Self-pay | Admitting: Neurology

## 2021-07-21 ENCOUNTER — Encounter: Payer: Self-pay | Admitting: Neurology

## 2021-07-21 VITALS — BP 109/64 | HR 78 | Ht 63.0 in | Wt 124.4 lb

## 2021-07-21 DIAGNOSIS — M5442 Lumbago with sciatica, left side: Secondary | ICD-10-CM

## 2021-07-21 DIAGNOSIS — E538 Deficiency of other specified B group vitamins: Secondary | ICD-10-CM

## 2021-07-21 DIAGNOSIS — F02B4 Dementia in other diseases classified elsewhere, moderate, with anxiety: Secondary | ICD-10-CM

## 2021-07-21 DIAGNOSIS — G309 Alzheimer's disease, unspecified: Secondary | ICD-10-CM

## 2021-07-21 DIAGNOSIS — F0280 Dementia in other diseases classified elsewhere without behavioral disturbance: Secondary | ICD-10-CM

## 2021-07-21 DIAGNOSIS — M5441 Lumbago with sciatica, right side: Secondary | ICD-10-CM

## 2021-07-21 DIAGNOSIS — G300 Alzheimer's disease with early onset: Secondary | ICD-10-CM

## 2021-07-21 DIAGNOSIS — G8929 Other chronic pain: Secondary | ICD-10-CM

## 2021-07-21 MED ORDER — GABAPENTIN 100 MG PO CAPS: ORAL_CAPSULE | ORAL | 0 refills | Status: DC

## 2021-07-21 MED ORDER — DONEPEZIL HCL 10 MG PO TABS: 10.0000 mg | ORAL_TABLET | Freq: Every day | ORAL | 1 refills | Status: DC

## 2021-07-21 MED ORDER — MEMANTINE HCL 10 MG PO TABS: 10.0000 mg | ORAL_TABLET | Freq: Two times a day (BID) | ORAL | 1 refills | Status: DC

## 2021-07-21 NOTE — Patient Instructions (Signed)
Restart gabapentin as directed on bottle ?Continue donepezil '10mg'$  at bedtime and memantine '10mg'$  twice daily ?Check B12 level ?Follow up 6 months. ?

## 2021-07-22 ENCOUNTER — Telehealth: Payer: Self-pay | Admitting: Neurology

## 2021-07-22 ENCOUNTER — Other Ambulatory Visit: Payer: Self-pay | Admitting: Family Medicine

## 2021-07-22 DIAGNOSIS — F419 Anxiety disorder, unspecified: Secondary | ICD-10-CM

## 2021-07-22 LAB — VITAMIN B12: Vitamin B-12: 233 pg/mL (ref 232–1245)

## 2021-07-22 NOTE — Telephone Encounter (Signed)
Patient does not remember what strength B-12 she is supposed to take. ?Call [phone]  ?

## 2021-07-23 MED ORDER — VITAMIN B-12 1000 MCG PO TABS: 1000.0000 ug | ORAL_TABLET | Freq: Every day | ORAL | 0 refills | Status: DC

## 2021-07-23 NOTE — Telephone Encounter (Signed)
[place] left message with access nurse stating that she wanted to know if the Dr prescribed her any vitamin B12? ?

## 2021-07-23 NOTE — Telephone Encounter (Signed)
Pt called in wanting to find out about what kind of B12 she should take? ?

## 2021-07-23 NOTE — Telephone Encounter (Signed)
Daughter advised.  ?B12 is over the counter.  But if she wants, please send prescription for B12 1062mg daily . ? ? ?

## 2021-08-06 ENCOUNTER — Telehealth: Payer: Self-pay | Admitting: Family Medicine

## 2021-08-18 ENCOUNTER — Other Ambulatory Visit: Payer: Self-pay | Admitting: Neurology

## 2021-08-18 ENCOUNTER — Other Ambulatory Visit: Payer: Self-pay | Admitting: Family Medicine

## 2021-08-23 ENCOUNTER — Other Ambulatory Visit: Payer: Self-pay | Admitting: Family

## 2021-08-23 ENCOUNTER — Other Ambulatory Visit: Payer: Self-pay | Admitting: Neurology

## 2021-08-26 ENCOUNTER — Telehealth: Payer: Self-pay

## 2021-08-26 MED ORDER — TRANDOLAPRIL 4 MG PO TABS
4.0000 mg | ORAL_TABLET | Freq: Every day | ORAL | 0 refills | Status: DC
Start: 2021-08-26 — End: 2021-11-25

## 2021-08-26 MED ORDER — VITAMIN B-12 1000 MCG PO TABS
1000.0000 ug | ORAL_TABLET | Freq: Every day | ORAL | 0 refills | Status: DC
Start: 2021-08-26 — End: 2022-06-22

## 2021-08-26 NOTE — Telephone Encounter (Signed)
I will send in refills. She is due for follow-up. Please make sure she gets scheduled. Thanks.

## 2021-08-26 NOTE — Telephone Encounter (Signed)
Patient states she needs a refill on two of her medications:  vitamin B-12 (CYANOCOBALAMIN) 1000 MCG tablet  trandolapril (MAVIK) 4 MG tablet  *Patient states her preferred pharmacy is [facility].

## 2021-09-12 ENCOUNTER — Ambulatory Visit: Payer: Self-pay | Admitting: Family Medicine

## 2021-10-06 ENCOUNTER — Encounter: Payer: Self-pay | Admitting: Family Medicine

## 2021-10-06 ENCOUNTER — Ambulatory Visit (INDEPENDENT_AMBULATORY_CARE_PROVIDER_SITE_OTHER): Payer: Self-pay | Admitting: Family Medicine

## 2021-10-06 DIAGNOSIS — R109 Unspecified abdominal pain: Secondary | ICD-10-CM | POA: Insufficient documentation

## 2021-10-06 DIAGNOSIS — G8929 Other chronic pain: Secondary | ICD-10-CM

## 2021-10-06 DIAGNOSIS — I10 Essential (primary) hypertension: Secondary | ICD-10-CM

## 2021-10-06 DIAGNOSIS — M546 Pain in thoracic spine: Secondary | ICD-10-CM

## 2021-10-06 DIAGNOSIS — R21 Rash and other nonspecific skin eruption: Secondary | ICD-10-CM | POA: Insufficient documentation

## 2021-10-06 DIAGNOSIS — E782 Mixed hyperlipidemia: Secondary | ICD-10-CM

## 2021-10-06 LAB — CBC WITH DIFFERENTIAL/PLATELET
Basophils Absolute: 0 10*3/uL (ref 0.0–0.1)
Basophils Relative: 0.5 % (ref 0.0–3.0)
Eosinophils Absolute: 0.3 10*3/uL (ref 0.0–0.7)
Eosinophils Relative: 4.5 % (ref 0.0–5.0)
HCT: 39.7 % (ref 36.0–46.0)
Hemoglobin: 13.2 g/dL (ref 12.0–15.0)
Lymphocytes Relative: 40.9 % (ref 12.0–46.0)
Lymphs Abs: 2.6 10*3/uL (ref 0.7–4.0)
MCHC: 33.4 g/dL (ref 30.0–36.0)
MCV: 98.2 fl (ref 78.0–100.0)
Monocytes Absolute: 0.3 10*3/uL (ref 0.1–1.0)
Monocytes Relative: 4.4 % (ref 3.0–12.0)
Neutro Abs: 3.1 10*3/uL (ref 1.4–7.7)
Neutrophils Relative %: 49.7 % (ref 43.0–77.0)
Platelets: 196 10*3/uL (ref 150.0–400.0)
RBC: 4.04 Mil/uL (ref 3.87–5.11)
RDW: 13.4 % (ref 11.5–15.5)
WBC: 6.3 10*3/uL (ref 4.0–10.5)

## 2021-10-06 LAB — COMPREHENSIVE METABOLIC PANEL
ALT: 11 U/L (ref 0–35)
AST: 17 U/L (ref 0–37)
Albumin: 4.2 g/dL (ref 3.5–5.2)
Alkaline Phosphatase: 80 U/L (ref 39–117)
BUN: 18 mg/dL (ref 6–23)
CO2: 28 mEq/L (ref 19–32)
Calcium: 9.5 mg/dL (ref 8.4–10.5)
Chloride: 103 mEq/L (ref 96–112)
Creatinine, Ser: 0.52 mg/dL (ref 0.40–1.20)
GFR: 103.82 mL/min (ref >60.00)
Glucose, Bld: 84 mg/dL (ref 70–99)
Potassium: 3.5 mEq/L (ref 3.5–5.1)
Sodium: 138 mEq/L (ref 135–145)
Total Bilirubin: 0.4 mg/dL (ref 0.2–1.2)
Total Protein: 7.1 g/dL (ref 6.0–8.3)

## 2021-10-06 MED ORDER — PREDNISONE 20 MG PO TABS: 40.0000 mg | ORAL_TABLET | Freq: Every day | ORAL | 0 refills | Status: DC

## 2021-10-06 NOTE — Addendum Note (Signed)
Addended by: [name], Easten Maceachern G on: 10/06/2021 12:05 PM   Modules accepted: Orders

## 2021-10-06 NOTE — Assessment & Plan Note (Signed)
Previously adequately controlled.  She will continue Crestor 20 mg daily.

## 2021-10-06 NOTE — Assessment & Plan Note (Signed)
Adequately controlled.  Continue trandolapril 4 mg daily.  Check labs.

## 2021-10-06 NOTE — Assessment & Plan Note (Signed)
Undetermined cause.  Given cost concerns I discussed having her try over-the-counter hydrocortisone on the lesions when they come up.  Discussed she should not apply this to any open wounds.  Discussed the need to see dermatology though she is not able to afford this at this time.

## 2021-10-06 NOTE — Assessment & Plan Note (Signed)
The patient has had left-sided abdominal pain and daily vomiting for some time now.  She does have tenderness on exam.  Discussed getting lab work to see if we can evaluate an underlying cause.  Discussed getting a CT scan as well to evaluate further given the duration of her symptoms and her exam.  She and her sister defer this at this time given that she does not have insurance.  She will be given patient assistance paperwork.  They note they are trying to work work on getting the patient Medicaid as well though they have not been able to touch base with Medicaid yet.  I additionally discussed the potential for seeing GI

## 2021-10-06 NOTE — Progress Notes (Addendum)
Tommi Rumps, MD Phone: [phone]  Christina Galvan is a 56 y.o. female who presents today for same-day visit.  Her sister is with her and provides some of the history.  Vomiting/abdominal pain: Patient notes issues with vomiting most days if she eats certain things.  She notes nausea with this.  She notes years of abdominal discomfort.  Occurs in the left mid abdomen.  She notes no blood in her stool.  She describes the abdominal pain as sharp.  She has a bowel movement twice weekly.  She notes no excessive gas.  She does not have to strain with bowel movements.  She notes there normal formed stools typically.  Skin lesions: Patient notes spots come up on her legs and they get crusty and she picks at them and opens wounds.  She notes they do not itch.  She notes no bug bites.  She notes this has been going on a year.  Hypertension: She is not checking her blood pressures at home.  She is taking trandolapril.  No chest pain, shortness of breath, or edema.  Hyperlipidemia: Taking Crestor.  No right upper quadrant pain.  Back pain: Patient notes this is a chronic ongoing issue.  She notes midline back pain.  She notes some radiation down the front of her left leg with some numbness in the same distribution.  She notes no weakness.  She notes no injury.  She is taking over-the-counter BC powders for this.  Social History   Tobacco Use  Smoking Status Some Days   Packs/day: 0.05   Years: 30.00   Total pack years: 1.50   Types: Cigarettes   Start date: 10/21/1984  Smokeless Tobacco Never    Current Outpatient Medications on File Prior to Visit  Medication Sig Dispense Refill   busPIRone (BUSPAR) 10 MG tablet TAKE 1 TABLET BY MOUTH TWICE A DAY 60 tablet 3   donepezil (ARICEPT) 10 MG tablet Take 1 tablet (10 mg total) by mouth at bedtime. 90 tablet 1   DULoxetine (CYMBALTA) 60 MG capsule TAKE 1 CAPSULE BY MOUTH ONCE DAILY. PLEASE MAKE A FOLLOW UP APPOINTMENT FOR REFILLS 30 capsule 5    gabapentin (NEURONTIN) 100 MG capsule TAKE 1 CAPSULE 3 TIMES DAILY FOR 7 DAYS,THEN 2 CAPSULE 3 TIMES DAILY FOR 7 DAYS,THEN 3 CAPSULE 3 TIMES DAILY THEREAFTER 270 capsule 0   memantine (NAMENDA) 10 MG tablet Take 1 tablet (10 mg total) by mouth 2 (two) times daily. 180 tablet 1   omeprazole (PRILOSEC) 20 MG capsule TAKE 1 CAPSULE BY MOUTH ONCE DAILY 30 capsule 2   rosuvastatin (CRESTOR) 20 MG tablet TAKE 1 TABLET BY MOUTH ONCE A DAY 30 tablet 3   SYRINGE-NEEDLE, DISP, 3 ML (B-D 3CC LUER-LOK SYR 25GX1") 25G X 1" 3 ML MISC USE ONCE WEEKLY WITH B12 INJECTION FOR 3 WEEKS, THEN ONCE MONTHLY. 7 each 0   trandolapril (MAVIK) 4 MG tablet Take 1 tablet (4 mg total) by mouth daily. 90 tablet 0   vitamin B-12 (CYANOCOBALAMIN) 1000 MCG tablet Take 1 tablet (1,000 mcg total) by mouth daily. 30 tablet 0   No current facility-administered medications on file prior to visit.     ROS see history of present illness  Objective  Physical Exam Vitals:   10/06/21 1129  BP: 110/70  Pulse: (!) 56  Temp: 98.3 F (36.8 C)  SpO2: 98%    BP Readings from Last 3 Encounters:  10/06/21 110/70  07/21/21 109/64  12/31/20 111/69   Wt Readings  from Last 3 Encounters:  10/06/21 125 lb 9.6 oz (57 kg)  07/21/21 124 lb 6.4 oz (56.4 kg)  02/07/21 130 lb (59 kg)    Physical Exam Constitutional:      General: She is not in acute distress.    Appearance: She is not diaphoretic.  Cardiovascular:     Rate and Rhythm: Normal rate and regular rhythm.     Heart sounds: Normal heart sounds.  Pulmonary:     Effort: Pulmonary effort is normal.     Breath sounds: Normal breath sounds.  Abdominal:     General: Bowel sounds are normal. There is no distension.     Palpations: Abdomen is soft.     Tenderness: There is abdominal tenderness (Most of her tenderness is in the left mid abdomen, though she does have some tenderness throughout the rest of her abdomen as well). There is no guarding.  Musculoskeletal:      Comments: There is some thoracic midline spine tenderness, no muscular back pain, no midline spine step-off  Skin:    General: Skin is warm and dry.  Neurological:     Mental Status: She is alert.     Comments: 5/5 strength in bilateral biceps, triceps, grip, quads, hamstrings, plantar and dorsiflexion, sensation to light touch intact in bilateral UE and LE, normal gait       Assessment/Plan: Please see individual problem list.  Problem List Items Addressed This Visit     Abdominal pain (Chronic)    The patient has had left-sided abdominal pain and daily vomiting for some time now.  She does have tenderness on exam.  Discussed getting lab work to see if we can evaluate an underlying cause.  Discussed getting a CT scan as well to evaluate further given the duration of her symptoms and her exam.  She and her sister defer this at this time given that she does not have insurance.  She will be given patient assistance paperwork.  They note they are trying to work work on getting the patient Medicaid as well though they have not been able to touch base with Medicaid yet.  I additionally discussed the potential for seeing GI      Relevant Orders   Comp Met (CMET)   CBC w/Diff   Chronic back pain (Chronic)    Chronic issue.  Discussed obtaining imaging though the patient declines this at this time given lack of insurance.  Discussed a short course of prednisone to see if that helps with nerve impingement symptoms.  She is advised of the risk of agitation, sleeping issues, and increased appetite.  If she is unable to sleep or if she has excessive agitation she will discontinue the prednisone.      Relevant Medications   predniSONE (DELTASONE) 20 MG tablet   Essential hypertension (Chronic)    Adequately controlled.  Continue trandolapril 4 mg daily.  Check labs.      Hyperlipidemia (Chronic)    Previously adequately controlled.  She will continue Crestor 20 mg daily.      Rash (Chronic)     Undetermined cause.  Given cost concerns I discussed having her try over-the-counter hydrocortisone on the lesions when they come up.  Discussed she should not apply this to any open wounds.  Discussed the need to see dermatology though she is not able to afford this at this time.        Return in about 4 weeks (around 11/03/2021) for Abdominal pain/vomiting follow-up.  Tommi Rumps, MD [place]

## 2021-10-06 NOTE — Patient Instructions (Addendum)
Nice to see you. We will get lab work today and contact you with the results. You can try over-the-counter hydrocortisone on the skin lesions on your legs.  You should not apply this to any open wounds. Please try to get Medicaid or Medicare and please also work on the financial assistance application.

## 2021-10-06 NOTE — Assessment & Plan Note (Signed)
Chronic issue.  Discussed obtaining imaging though the patient declines this at this time given lack of insurance.  Discussed a short course of prednisone to see if that helps with nerve impingement symptoms.  She is advised of the risk of agitation, sleeping issues, and increased appetite.  If she is unable to sleep or if she has excessive agitation she will discontinue the prednisone.

## 2021-11-07 ENCOUNTER — Ambulatory Visit: Payer: Self-pay | Admitting: Family Medicine

## 2021-11-17 ENCOUNTER — Other Ambulatory Visit: Payer: Self-pay | Admitting: Family Medicine

## 2021-11-22 ENCOUNTER — Other Ambulatory Visit: Payer: Self-pay | Admitting: Family Medicine

## 2021-12-01 ENCOUNTER — Other Ambulatory Visit: Payer: Self-pay | Admitting: Family Medicine

## 2021-12-01 DIAGNOSIS — F419 Anxiety disorder, unspecified: Secondary | ICD-10-CM

## 2021-12-16 ENCOUNTER — Other Ambulatory Visit: Payer: Self-pay | Admitting: Family Medicine

## 2021-12-16 DIAGNOSIS — E785 Hyperlipidemia, unspecified: Secondary | ICD-10-CM

## 2022-01-22 NOTE — Progress Notes (Deleted)
NEUROLOGY FOLLOW UP OFFICE NOTE  Christina Galvan [id number]  Assessment/Plan:   1.  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 2.  Tension-type headaches 3.  Chronic low back pain with lumbosacral radiculopathy, bilateral 4.  Depression with anxiety. 5.  B12 deficiency   Donepezil '10mg'$  at bedtime and memantine '10mg'$  twice daily For headache and sciatica:  restart gabapentin, titrating to '300mg'$  three times daily; continue duloxetine '60mg'$  daily For depression and anxiety, Cymbalta. History of B12 deficiency.  Will repeat level. Follow up 6 months. Discussed referral to memory clinic at [facility] to explore any other treatment options/trials.  Patient declines at this time.     Subjective:  Christina Galvan is a 56 year old right-handed female with HTN, chronic low back pain, migraines, depression, anxiety and history of kidney stones who follows up for early-onset Alzheimer's disease/posterior cortical atrophy   UPDATE: Last visit, restarted gabapentin to great headache and sciatica.  She is no longer taking it.  Not sure why.  Her sister managed her medications.  She gets one headache a week and lasts in a couple of hours.  One time it lasted 8 hours.     She is living with her mother.  Not driving.  Her sister handles her finances.  She is still able to take her medications independently.  Her sister sometimes helps.    Current NSAIDS:  none Current analgesics:  Goodys Current triptans:  none Current ergotamine:  none. Current anti-emetic:  Zofran Current muscle relaxants:  none Current anti-anxiolytic:  none Current sleep aide:  none Current Antihypertensive medications:  none Current Antidepressant medications:  Cymbalta '60mg'$  daily (headache and sciatica) Current Anticonvulsant medications:  none Current anti-CGRP:  none Current Vitamins/Herbal/Supplements:  B12 1028mg injections monthly Current  Antihistamines/Decongestants:  none Other therapy:  none Hormone/birth control:  none Other medications:  Donepezil '10mg'$  at bedtime, memantine '10mg'$  BID   HISTORY: Alzheimer's Dementia with Posterior Cortical Dysfunction:  Since 2020, she has had several medication changes for her anxiety and episodes of confusion.  She was switched from buspirone to sertraline. She was then switched from sertraline to Cymbalta '30mg'$  in attempt to better treat anxiety and sciatic pain.  She reports worsening symptoms since then.  She describes episodes where she feels like she is in a fog, feeling more angry and sometimes has hallucinations of people walking.  It is typically elicited when she is upset about something.  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  Labs from November 2020 showed TSH 0.63 but also low B12 of 170.  She was started on B12 injections but reports no improvement.  MRI of brain without contrast from 02/07/2019 showed mild chronic small vessel ischemic changes but overall unremarkable.    Around the same time that her symptoms worsened, she reported increased emotional stress.  She had to financially support her son who was in legal trouble, which caused her to lose her home.  She had to move in with her mother.  She underwent neuropsychological testing on 07/13/2019, which demonstrated global and significant impairment which suggests a posterior cortical dysfunction such as early-onset Alzheimer's disease or corticobasal syndrome but did not correlate with Lewy Body Disease.  For further evaluation, she underwent PET scan of brain on 08/22/2019, which demonstrated decreased cortical metabolism in the biparietal lobes  and occipital lobes.  She is on long-term disability.   Chronic Tension-type Headaches:  In 2021, she also reported a persistent dull  non-throbbing bifrontal headache.  No associated nausea, vomiting, visual disturbance, numbness or weakness.  Previously took [place] powder daily.   Chronic Low Back Pain with Radiculopathy:  MRI of lumbar spine on 08/30/2018 personally reviewed showed broad-based central disc protrusions at L4-5 causing mild to moderate bilateral lateral recess stenosis potentially affecting either L5 nerve roots, and broad-based central disc protrusion at L5-S1 approximating both S1 nerve roots but no obvious impingement.  Follow up lumbar x-ray on 12/15/2019 personally reviewed was negative.  Back and leg pain aggravated with bilateral leg numbness particularly with prolonged walking and walking up inclines.  Previously took tramadol.   Past NSAIDS:  Ibuprofen, naproxen Past analgesics:  tramadol Past abortive triptans:  none Past abortive ergotamine:  none Past muscle relaxants:  none Past anti-emetic:  none Past antihypertensive medications:  none Past antidepressant medications:  Sertraline '200mg'$  daily Past anticonvulsant medications: gabapentin Past anti-CGRP:  none     Past history:  No history of seizures, head trauma  PAST MEDICAL HISTORY: Past Medical History:  Diagnosis Date   Anxiety    Anxiety and depression    Depression with anxiety    GERD (gastroesophageal reflux disease)    History of frequent urinary tract infections    Hypertension    Kidney stones    Migraines     MEDICATIONS: Current Outpatient Medications on File Prior to Visit  Medication Sig Dispense Refill   busPIRone (BUSPAR) 10 MG tablet TAKE 1 TABLET BY MOUTH TWICE A DAY 60 tablet 3   donepezil (ARICEPT) 10 MG tablet Take 1 tablet (10 mg total) by mouth at bedtime. 90 tablet 1   DULoxetine (CYMBALTA) 60 MG capsule TAKE 1 CAPSULE BY MOUTH ONCE DAILY. PLEASE MAKE A FOLLOW UP APPOINTMENT FOR REFILLS 30 capsule 5   gabapentin (NEURONTIN) 100 MG capsule TAKE 1 CAPSULE 3 TIMES DAILY FOR 7 DAYS,THEN 2 CAPSULE 3 TIMES DAILY FOR  7 DAYS,THEN 3 CAPSULE 3 TIMES DAILY THEREAFTER 270 capsule 0   memantine (NAMENDA) 10 MG tablet Take 1 tablet (10 mg total) by mouth 2 (two) times daily. 180 tablet 1   omeprazole (PRILOSEC) 20 MG capsule TAKE 1 CAPSULE BY MOUTH ONCE DAILY 30 capsule 2   predniSONE (DELTASONE) 20 MG tablet Take 2 tablets (40 mg total) by mouth daily with breakfast. 10 tablet 0   rosuvastatin (CRESTOR) 20 MG tablet TAKE 1 TABLET BY MOUTH ONCE A DAY 30 tablet 3   SYRINGE-NEEDLE, DISP, 3 ML (B-D 3CC LUER-LOK SYR 25GX1") 25G X 1" 3 ML MISC USE ONCE WEEKLY WITH B12 INJECTION FOR 3 WEEKS, THEN ONCE MONTHLY. 7 each 0   trandolapril (MAVIK) 4 MG tablet TAKE 1 TABLET BY MOUTH DAILY 90 tablet 0   vitamin B-12 (CYANOCOBALAMIN) 1000 MCG tablet Take 1 tablet (1,000 mcg total) by mouth daily. 30 tablet 0   No current facility-administered medications on file prior to visit.    ALLERGIES: Allergies  Allergen Reactions   Thiazide-Type Diuretics     Hypokalemia, Hyponatremia, Chest pain   Mushroom Extract Complex    Sulfa Antibiotics     FAMILY HISTORY: Family History  Problem Relation Age of Onset   Arthritis Mother    Heart disease Mother    Hypertension Mother    Diabetes Mother    Arrhythmia Mother        A-Fib    Cancer  Father        Lung Cancer   Diabetes Brother    Asthma Daughter    Cancer Paternal Uncle        Prostate cancer   Heart disease Maternal Grandfather    Heart disease Paternal Grandfather       Objective:  *** General: No acute distress.  Patient appears well-groomed.   Head:  Normocephalic/atraumatic Eyes:  Fundi examined but not visualized Heart:  Regular rate and rhythm Neurological Exam: alert and oriented to person, place, year.  Speech fluent and not dysarthric, language intact.   ***  CN II-XII intact. Bulk and tone normal, muscle strength 5/5 throughout.  Sensation to light touch intact.  Deep tendon reflexes 2+ throughout, toes downgoing.  Finger to nose testing intact.   Gait normal, Romberg negative.   Metta Clines, DO  CC: Tommi Rumps, MD

## 2022-01-26 ENCOUNTER — Ambulatory Visit: Payer: Self-pay | Admitting: Neurology

## 2022-01-28 IMAGING — PT NM PET TUM IMG INITIAL (PI) WHOLE BODY
1 series · 25 of 25 positions shown · non-contrast
Comparison: Brain MR 02/07/2019

CLINICAL DATA: Dementia. Frontotemporal dementia versus Alzheimer's
type dementia

EXAM:
NM PET METABOLIC BRAIN
TECHNIQUE: 10.3 mCi F-18 FDG was injected intravenously. Full-ring PET imaging
was performed from the vertex to skull base. CT data was obtained
and used for attenuation correction and anatomic localization.
FASTING BLOOD GLUCOSE:  Value: 91 mg/dl

[Series 1061: mpr sag 5mm range · 1.26mm/px · 25 of 33 slices shown]
[im 1/33]
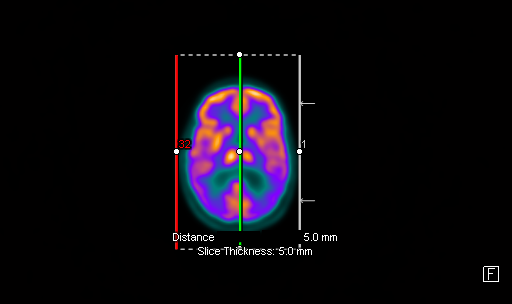
[im 2/33]
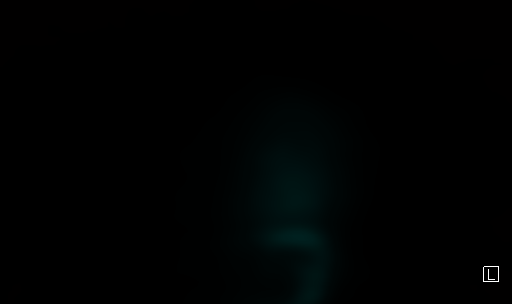
[im 3/33]
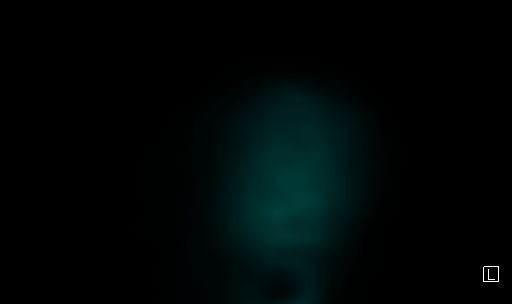
[im 5/33]
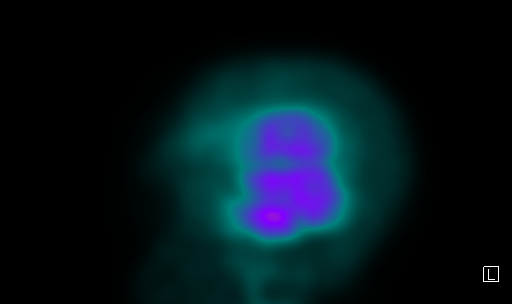
[im 6/33]
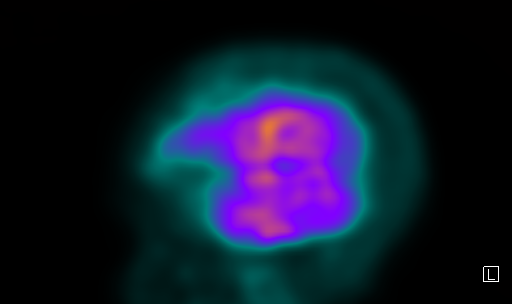
[im 7/33]
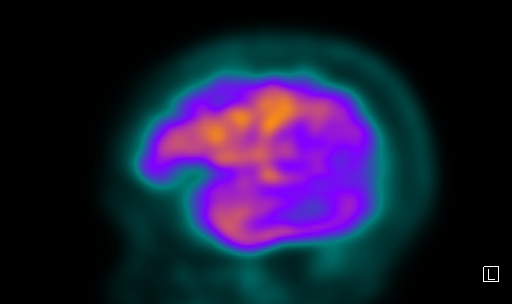
[im 9/33]
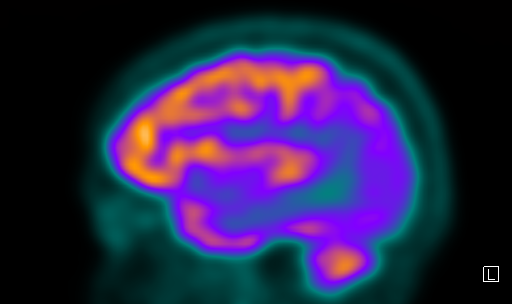
[im 10/33]
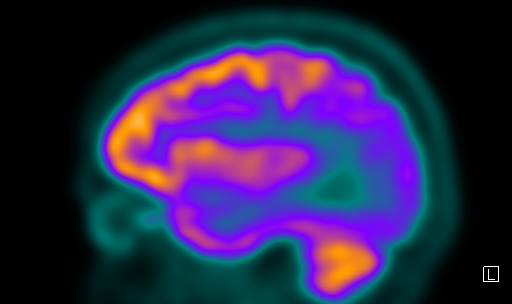
[im 11/33]
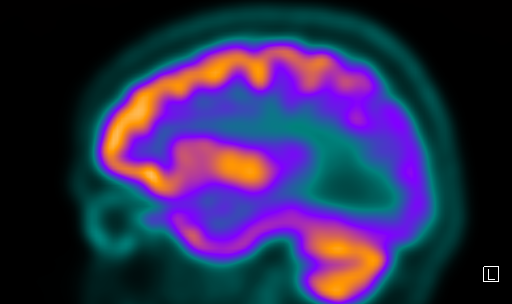
[im 13/33]
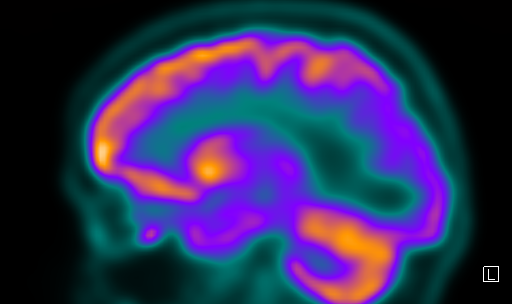
[im 14/33]
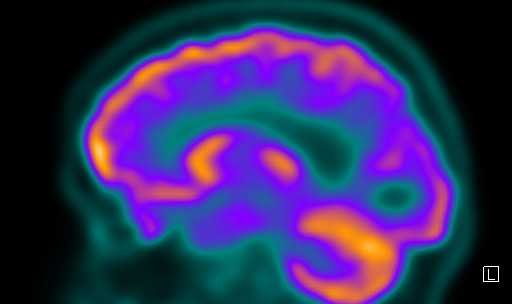
[im 15/33]
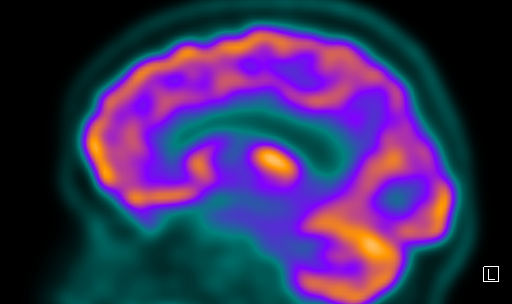
[im 17/33]
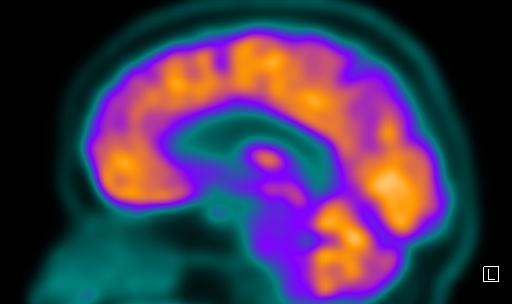
[im 18/33]
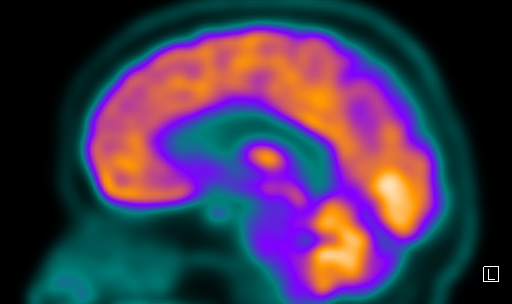
[im 19/33]
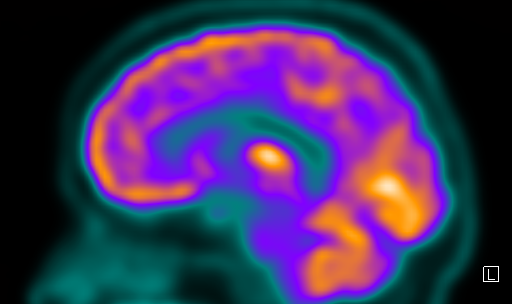
[im 21/33]
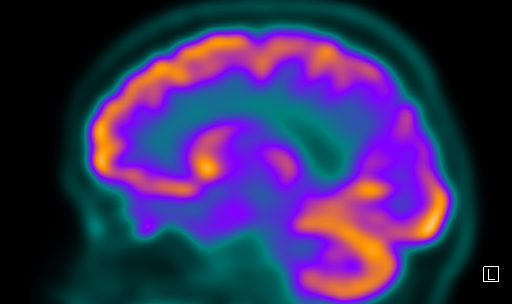
[im 22/33]
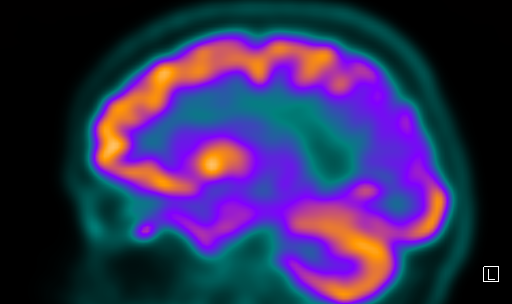
[im 23/33]
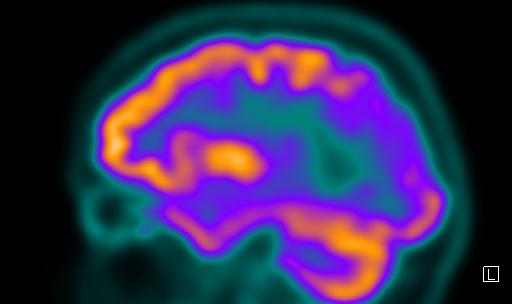
[im 25/33]
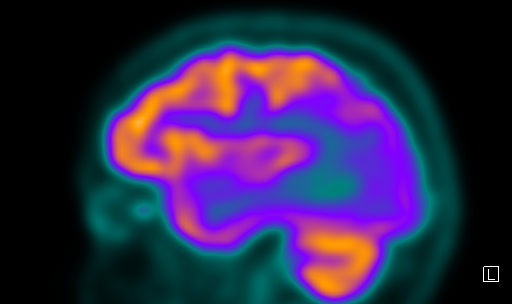
[im 26/33]
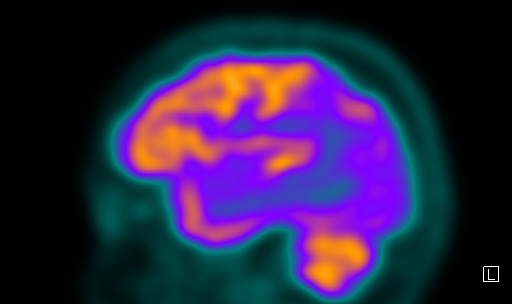
[im 27/33]
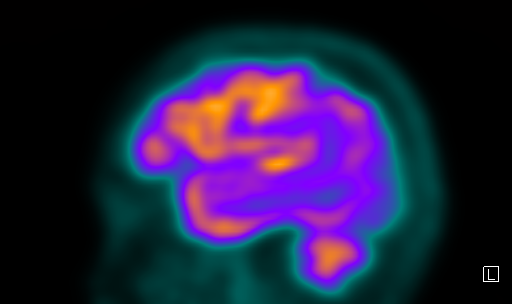
[im 29/33]
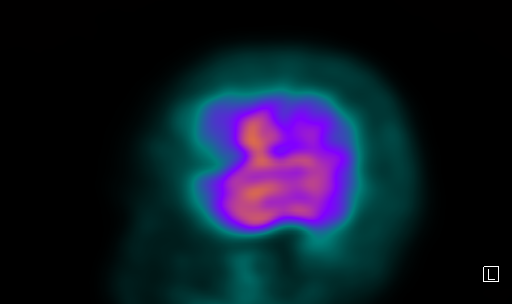
[im 30/33]
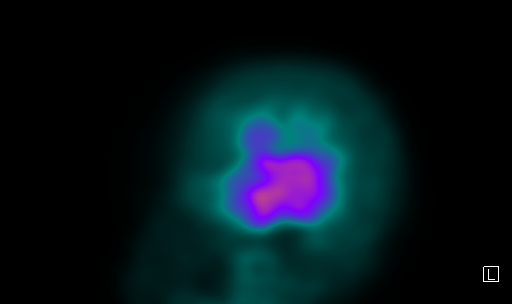
[im 31/33]
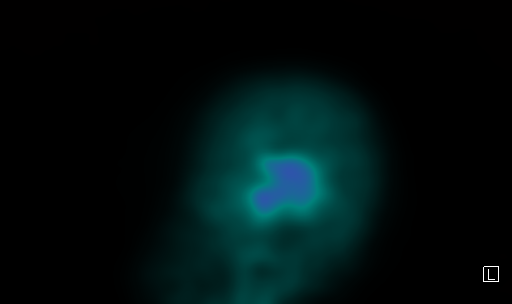
[im 33/33]
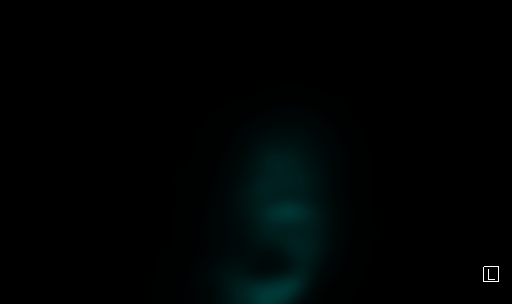

[25 of 25 positions shown; findings below may reference images not displayed]

FINDINGS: There is decreased relative cortical metabolism within the LEFT and
RIGHT parietal lobes. Normal relative cortical metabolism in the
frontal lobes. Additionally, there is decreased relative cortical
metabolism within the occipital lobes. Potential mild decreased
relative cortical metabolism in the temporal lobes.
IMPRESSION: 1. Decrease biparietal relative cortical metabolism is a pattern
which can be present in Alzheimer's disease pathology.
2. Additionally, there is decreased cortical metabolism in the
occipital lobes which is not typical of Alzheimer's disease
pathology. This pattern can be associated with Sig body dementia.
3. Normal relative cortical metabolism frontal lobes is inconsistent
with frontotemporal dementia.

Alzheimer's disease remains a clinical diagnosis. FDG metabolic
brain imaging is an adjuvant test used as in conjunction with
clinical evaluation in assessing cognitive impairment.

## 2022-02-13 ENCOUNTER — Other Ambulatory Visit: Payer: Self-pay | Admitting: Neurology

## 2022-02-24 ENCOUNTER — Other Ambulatory Visit: Payer: Self-pay | Admitting: Family Medicine

## 2022-04-20 ENCOUNTER — Telehealth: Payer: Self-pay | Admitting: Family Medicine

## 2022-04-20 DIAGNOSIS — F419 Anxiety disorder, unspecified: Secondary | ICD-10-CM

## 2022-04-20 NOTE — Telephone Encounter (Signed)
Pt need a refill on busPIRone sent to [facility]

## 2022-04-24 ENCOUNTER — Other Ambulatory Visit: Payer: Self-pay

## 2022-04-24 ENCOUNTER — Other Ambulatory Visit: Payer: Self-pay | Admitting: Family Medicine

## 2022-04-24 DIAGNOSIS — E785 Hyperlipidemia, unspecified: Secondary | ICD-10-CM

## 2022-05-16 ENCOUNTER — Other Ambulatory Visit: Payer: Self-pay | Admitting: Family Medicine

## 2022-05-16 DIAGNOSIS — E785 Hyperlipidemia, unspecified: Secondary | ICD-10-CM

## 2022-05-25 ENCOUNTER — Other Ambulatory Visit: Payer: Self-pay | Admitting: Family Medicine

## 2022-05-29 NOTE — Progress Notes (Unsigned)
NEUROLOGY FOLLOW UP OFFICE NOTE  FAMA TULLY [id number]  Assessment/Plan:   1.  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 2.  Tension-type headaches 3.  Chronic low back pain with lumbosacral radiculopathy, bilateral 4.  Depression with anxiety. 5.  B12 deficiency   Plan to change from duloxetine to nortriptyline to address headache, depression, appetite, sciatica Donepezil 10mg  at bedtime and memantine 10mg  twice daily For headache:  taper off Cymbalta, then start nortriptyline 10mg  at bedtime.  We can increase to 25mg  in 4 weeks if needed. For depression and anxiety, taper off Cymbalta and start nortriptyline Encouraged walks but with someone to accompany her, not alone Encouraged socialization Follow up 6 months.      Subjective:  Christina Galvan is a 57 year old right-handed female with HTN, chronic low back pain, migraines, depression, anxiety and history of kidney stones who follows up for early-onset Alzheimer's disease/posterior cortical atrophy   UPDATE: She is living with her mother.  Not driving.  Her sister handles her finances.  Her sister manages her medications.  She is depressed.  She has no appetite.  She is independent in regards to hygiene but doesn't want to shower daily.    Current NSAIDS:  none Current analgesics:  Goodys Current triptans:  none Current ergotamine:  none. Current anti-emetic:  Zofran Current muscle relaxants:  none Current anti-anxiolytic:  none Current sleep aide:  none Current Antihypertensive medications:  none Current Antidepressant medications:  Cymbalta 60mg  daily (headache and sciatica) Current Anticonvulsant medications:  none Current anti-CGRP:  none Current Vitamins/Herbal/Supplements:  none Current Antihistamines/Decongestants:  none Other therapy:  none Hormone/birth control:  none Other medications:  Donepezil 10mg  at bedtime, memantine  10mg  BID   HISTORY: Alzheimer's Dementia with Posterior Cortical Dysfunction:  Since 2020, she has had several medication changes for her anxiety and episodes of confusion.  She was switched from buspirone to sertraline. She was then switched from sertraline to Cymbalta 30mg  in attempt to better treat anxiety and sciatic pain.  She reports worsening symptoms since then.  She describes episodes where she feels like she is in a fog, feeling more angry and sometimes has hallucinations of people walking.  It is typically elicited when she is upset about something.  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  She reports that she sometimes files papers in the wrong chart or will sometimes forget names.  Labs from November 2020 showed TSH 0.63 but also low B12 of 170.  She was started on B12 injections but reports no improvement.  MRI of brain without contrast from 02/07/2019 showed mild chronic small vessel ischemic changes but overall unremarkable.    Around the same time that her symptoms worsened, she reported increased emotional stress.  She had to financially support her son who was in legal trouble, which caused her to lose her home.  She had to move in with her mother.  She underwent neuropsychological testing on 07/13/2019, which demonstrated global and significant impairment which suggests a posterior cortical dysfunction such as early-onset Alzheimer's disease or corticobasal syndrome but did not correlate with Lewy Body Disease.  For further evaluation, she underwent PET scan of brain on 08/22/2019, which demonstrated decreased cortical metabolism in the biparietal lobes and occipital lobes.  She is on long-term disability.   Chronic Tension-type Headaches:  In 2021, she also reported a persistent dull non-throbbing bifrontal headache.  No associated  nausea, vomiting, visual disturbance, numbness or weakness.  Previously took [place] powder daily.   Chronic  Low Back Pain with Radiculopathy:  MRI of lumbar spine on 08/30/2018 personally reviewed showed broad-based central disc protrusions at L4-5 causing mild to moderate bilateral lateral recess stenosis potentially affecting either L5 nerve roots, and broad-based central disc protrusion at L5-S1 approximating both S1 nerve roots but no obvious impingement.  Follow up lumbar x-ray on 12/15/2019 personally reviewed was negative.  Back and leg pain aggravated with bilateral leg numbness particularly with prolonged walking and walking up inclines.  Previously took tramadol.   Past NSAIDS:  Ibuprofen, naproxen Past analgesics:  tramadol Past abortive triptans:  none Past abortive ergotamine:  none Past muscle relaxants:  none Past anti-emetic:  none Past antihypertensive medications:  none Past antidepressant medications:  Sertraline 200mg  daily Past anticonvulsant medications: gabapentin Past anti-CGRP:  none Past vitamins/supplements:  B12     Past history:  No history of seizures, head trauma  PAST MEDICAL HISTORY: Past Medical History:  Diagnosis Date   Anxiety    Anxiety and depression    Depression with anxiety    GERD (gastroesophageal reflux disease)    History of frequent urinary tract infections    Hypertension    Kidney stones    Migraines     MEDICATIONS: Current Outpatient Medications on File Prior to Visit  Medication Sig Dispense Refill   busPIRone (BUSPAR) 10 MG tablet Take 1 tablet (10 mg total) by mouth 2 (two) times daily. 60 tablet 3   cyanocobalamin (,VITAMIN B-12,) 1000 MCG/ML injection Inject 1000 mcg (1 mL) IM once weekly for 3 weeks and then once monthly 10 mL 1   donepezil (ARICEPT) 10 MG tablet TAKE 1 TABLET BY MOUTH EVERY NIGHT AT BEDTIME 30 tablet 0   DULoxetine (CYMBALTA) 60 MG capsule TAKE 1 CAPSULE BY MOUTH ONCE DAILY. PLEASE MAKE A FOLLOW UP APPOINTMENT FOR REFILLS 30 capsule 5   famotidine (PEPCID) 20 MG tablet TAKE 2 TABLETS BY MOUTH ONCE DAILY 60 tablet  5   gabapentin (NEURONTIN) 100 MG capsule Take 1 capsule three times daily for 7 days, then 2 capsules three times daily for 7 days, then 3 capsules three times daily.  Call the office for refill when you get close to running out. 270 capsule 0   ibuprofen (ADVIL) 600 MG tablet Take 1 tablet (600 mg total) by mouth every 6 (six) hours as needed. 30 tablet 0   memantine (NAMENDA) 10 MG tablet TAKE 1 TABLET BY MOUTH TWICE A DAY. 60 tablet 5   omeprazole (PRILOSEC) 20 MG capsule TAKE 1 CAPSULE BY MOUTH ONCE DAILY 30 capsule 2   ondansetron (ZOFRAN ODT) 4 MG disintegrating tablet Dissolve 1 tablet by mouth every 8 hours as needed for nausea or vomiting. 20 tablet 5   rizatriptan (MAXALT-MLT) 10 MG disintegrating tablet Take 1 tablet earliest onset of headache.  May repeat in 2 hours if needed.  Maximum 2 tablets in 24 hours. 10 tablet 5   rosuvastatin (CRESTOR) 20 MG tablet TAKE 1 TABLET BY MOUTH ONCE A DAY 30 tablet 3   SYRINGE-NEEDLE, DISP, 3 ML (B-D 3CC LUER-LOK SYR 25GX1") 25G X 1" 3 ML MISC USE ONCE WEEKLY WITH B12 INJECTION FOR 3 WEEKS, THEN ONCE MONTHLY. 7 each 0   trandolapril (MAVIK) 4 MG tablet TAKE 1 TABLET BY MOUTH ONCE A DAY 90 tablet 0   No current facility-administered medications on file prior to visit.    ALLERGIES: Allergies  Allergen Reactions   Thiazide-Type Diuretics     Hypokalemia, Hyponatremia, Chest pain   Mushroom Extract Complex    Sulfa Antibiotics     FAMILY HISTORY: Family History  Problem Relation Age of Onset   Arthritis Mother    Heart disease Mother    Hypertension Mother    Diabetes Mother    Arrhythmia Mother        A-Fib    Cancer Father        Lung Cancer   Diabetes Brother    Asthma Daughter    Cancer Paternal Uncle        Prostate cancer   Heart disease Maternal Grandfather    Heart disease Paternal Grandfather       Objective:  Blood pressure 98/62, pulse 62, height 5\' 3"  (1.6 m), weight 114 lb (51.7 kg), SpO2 96 %. General: No acute  distress.  Patient appears well-groomed.   Head:  Normocephalic/atraumatic Eyes:  Fundi examined but not visualized Heart:  Regular rate and rhythm Neurological Exam:     06/01/2022    3:00 PM 07/21/2021    4:00 PM 07/13/2019    1:00 PM 01/13/2019    1:45 PM  MMSE - Mini Mental State Exam  Orientation to time 1 1 4 4   Orientation to Place 5 5 5 5   Registration 3 3 3 3   Attention/ Calculation 0 1 0 2  Recall 0 1 1 1   Language- name 2 objects 2 2 2 2   Language- repeat 0 0 0 1  Language- follow 3 step command 3 2 2 3   Language- read & follow direction 0 0 1 1  Write a sentence 0 1 1 1   Copy design 0 0 0 0  Total score 14 16 19 23   CN II-XII intact.  Muscle strength 5/5.  Sensation intact to light touch.  Deep tendon reflexes 2+ throughout.  Antalgic gait.  Romberg negative.   Metta Clines, DO  CC: Tommi Rumps, MD

## 2022-06-01 ENCOUNTER — Encounter: Payer: Self-pay | Admitting: Neurology

## 2022-06-01 ENCOUNTER — Ambulatory Visit (INDEPENDENT_AMBULATORY_CARE_PROVIDER_SITE_OTHER): Payer: Medicare Other | Admitting: Neurology

## 2022-06-01 VITALS — BP 98/62 | HR 62 | Ht 63.0 in | Wt 114.0 lb

## 2022-06-01 DIAGNOSIS — G300 Alzheimer's disease with early onset: Secondary | ICD-10-CM

## 2022-06-01 DIAGNOSIS — G44221 Chronic tension-type headache, intractable: Secondary | ICD-10-CM | POA: Diagnosis not present

## 2022-06-01 DIAGNOSIS — F02B4 Dementia in other diseases classified elsewhere, moderate, with anxiety: Secondary | ICD-10-CM

## 2022-06-01 DIAGNOSIS — M5442 Lumbago with sciatica, left side: Secondary | ICD-10-CM | POA: Diagnosis not present

## 2022-06-01 DIAGNOSIS — G8929 Other chronic pain: Secondary | ICD-10-CM

## 2022-06-01 DIAGNOSIS — M5441 Lumbago with sciatica, right side: Secondary | ICD-10-CM

## 2022-06-01 MED ORDER — DULOXETINE HCL 30 MG PO CPEP: 30.0000 mg | ORAL_CAPSULE | Freq: Every day | ORAL | 0 refills | Status: DC

## 2022-06-01 MED ORDER — TIZANIDINE HCL 2 MG PO TABS: 2.0000 mg | ORAL_TABLET | Freq: Four times a day (QID) | ORAL | 5 refills | Status: AC | PRN

## 2022-06-01 MED ORDER — NORTRIPTYLINE HCL 10 MG PO CAPS: 10.0000 mg | ORAL_CAPSULE | Freq: Every day | ORAL | 5 refills | Status: DC

## 2022-06-01 NOTE — Patient Instructions (Addendum)
Decrease duloxetine to 30mg  daily for one week, then stop Once you have finished 7 day supply of duloxetine 30mg , start nortriptyline 10mg  at bedtime to help with headaches, appetite, nerve pain.  We can increase dose after 4 weeks if needed. Stop Goodys.  Take Tylenol with tizanidine 2mg  as needed for headaches but limit to no more than 2 days out of the week. Follow up in 8 months.

## 2022-06-22 ENCOUNTER — Other Ambulatory Visit: Payer: Self-pay | Admitting: Neurology

## 2022-06-22 ENCOUNTER — Telehealth: Payer: Self-pay | Admitting: Family Medicine

## 2022-06-22 ENCOUNTER — Encounter: Payer: Self-pay | Admitting: Family Medicine

## 2022-06-22 ENCOUNTER — Ambulatory Visit (INDEPENDENT_AMBULATORY_CARE_PROVIDER_SITE_OTHER): Payer: Medicare Other | Admitting: Family Medicine

## 2022-06-22 VITALS — BP 116/78 | HR 76 | Temp 98.5°F | Ht 63.0 in | Wt 113.4 lb

## 2022-06-22 DIAGNOSIS — I10 Essential (primary) hypertension: Secondary | ICD-10-CM

## 2022-06-22 DIAGNOSIS — R109 Unspecified abdominal pain: Secondary | ICD-10-CM

## 2022-06-22 DIAGNOSIS — E782 Mixed hyperlipidemia: Secondary | ICD-10-CM | POA: Diagnosis not present

## 2022-06-22 DIAGNOSIS — Z1231 Encounter for screening mammogram for malignant neoplasm of breast: Secondary | ICD-10-CM

## 2022-06-22 DIAGNOSIS — Z1211 Encounter for screening for malignant neoplasm of colon: Secondary | ICD-10-CM

## 2022-06-22 DIAGNOSIS — M546 Pain in thoracic spine: Secondary | ICD-10-CM | POA: Diagnosis not present

## 2022-06-22 DIAGNOSIS — R1032 Left lower quadrant pain: Secondary | ICD-10-CM | POA: Diagnosis not present

## 2022-06-22 DIAGNOSIS — R634 Abnormal weight loss: Secondary | ICD-10-CM

## 2022-06-22 DIAGNOSIS — G8929 Other chronic pain: Secondary | ICD-10-CM

## 2022-06-22 NOTE — Assessment & Plan Note (Addendum)
Weight has been trending down.  She has not been eating which may be related to what ever is causing her abdominal discomfort.  Discussed getting cancer screening up-to-date with colon cancer screening and mammogram.  Both of those orders will be placed.  Will have her return for her Pap smear.  CT imaging of abdomen ordered.  Lab work ordered as well.

## 2022-06-22 NOTE — Progress Notes (Signed)
Marikay Alar, MD Phone: [phone]  Christina Galvan is a 57 y.o. female who presents today for f/u.  HYPERTENSION Disease Monitoring Home BP Monitoring not checking Chest pain- no    Dyspnea- no Medications Compliance-  taking trandolapril.  Edema- no BMET    Component Value Date/Time   NA 138 10/06/2021 1203   NA 140 12/17/2012 1926   K 3.5 10/06/2021 1203   K 3.1 (L) 12/17/2012 1926   CL 103 10/06/2021 1203   CL 108 (H) 12/17/2012 1926   CO2 28 10/06/2021 1203   CO2 26 12/17/2012 1926   GLUCOSE 84 10/06/2021 1203   GLUCOSE 111 (H) 12/17/2012 1926   BUN 18 10/06/2021 1203   BUN 16 12/17/2012 1926   CREATININE 0.52 10/06/2021 1203   CREATININE 0.71 12/17/2012 1926   CALCIUM 9.5 10/06/2021 1203   CALCIUM 9.0 12/17/2012 1926   GFRNONAA >60 10/23/2020 1218   GFRNONAA >60 12/17/2012 1926   GFRAA >60 11/30/2015 2307   GFRAA >60 12/17/2012 1926   HYPERLIPIDEMIA Symptoms Chest pain on exertion:  no   Medications: Compliance- taking crestor Muscle aches- no Lipid Panel     Component Value Date/Time   CHOL 95 12/13/2020 1131   TRIG 136.0 12/13/2020 1131   HDL 38.40 (L) 12/13/2020 1131   CHOLHDL 2 12/13/2020 1131   VLDL 27.2 12/13/2020 1131   LDLCALC 30 12/13/2020 1131   LDLDIRECT 40.0 03/30/2018 0807   Abdominal pain: Patient notes left-sided abdominal pain.  This has been going on since her last visit last year.  At that time they declined imaging given lack of insurance.  She notes she does not like to eat at this point as it feels like she will vomit after eating food she does not like.  She has diarrhea 2 times a month.  She has nausea frequently.  She notes abdominal pain with greasy foods.  She is not hungry.  She does note night sweats over the last 3 months.  She notes those are drenching.  She does not have bowel movements daily.  She also notes low back pain like somebody punched her that is been going on for months.  No radiation of pain.  No numbness or  weakness.  Social History   Tobacco Use  Smoking Status Some Days   Packs/day: 0.05   Years: 30.00   Additional pack years: 0.00   Total pack years: 1.50   Types: Cigarettes   Start date: 10/21/1984  Smokeless Tobacco Never    Current Outpatient Medications on File Prior to Visit  Medication Sig Dispense Refill   busPIRone (BUSPAR) 10 MG tablet TAKE 1 TABLET BY MOUTH TWICE A DAY 60 tablet 3   donepezil (ARICEPT) 10 MG tablet TAKE 1 TABLET BY MOUTH AT BEDTIME 90 tablet 1   DULoxetine (CYMBALTA) 30 MG capsule Take 1 capsule (30 mg total) by mouth daily. 7 capsule 0   gabapentin (NEURONTIN) 100 MG capsule TAKE 1 CAPSULE 3 TIMES DAILY FOR 7 DAYS,THEN 2 CAPSULE 3 TIMES DAILY FOR 7 DAYS,THEN 3 CAPSULE 3 TIMES DAILY THEREAFTER 270 capsule 0   memantine (NAMENDA) 10 MG tablet Take 1 tablet (10 mg total) by mouth 2 (two) times daily. 180 tablet 1   nortriptyline (PAMELOR) 10 MG capsule Take 1 capsule (10 mg total) by mouth at bedtime. 30 capsule 5   omeprazole (PRILOSEC) 20 MG capsule TAKE 1 CAPSULE BY MOUTH ONCE DAILY 30 capsule 2   predniSONE (DELTASONE) 20 MG tablet Take 2 tablets (  40 mg total) by mouth daily with breakfast. 10 tablet 0   rosuvastatin (CRESTOR) 20 MG tablet TAKE ONE TABLET BY MOUTH ONCE A DAY 30 tablet 3   SYRINGE-NEEDLE, DISP, 3 ML (B-D 3CC LUER-LOK SYR 25GX1") 25G X 1" 3 ML MISC USE ONCE WEEKLY WITH B12 INJECTION FOR 3 WEEKS, THEN ONCE MONTHLY. 7 each 0   tiZANidine (ZANAFLEX) 2 MG tablet Take 1 tablet (2 mg total) by mouth every 6 (six) hours as needed for muscle spasms. 30 tablet 5   trandolapril (MAVIK) 4 MG tablet TAKE ONE TABLET BY MOUTH DAILY 90 tablet 1   No current facility-administered medications on file prior to visit.     ROS see history of present illness  Objective  Physical Exam Vitals:   06/22/22 1550  BP: 116/78  Pulse: 76  Temp: 98.5 F (36.9 C)  SpO2: 98%    BP Readings from Last 3 Encounters:  06/22/22 116/78  06/01/22 98/62  10/06/21  110/70   Wt Readings from Last 3 Encounters:  06/22/22 113 lb 6.4 oz (51.4 kg)  06/01/22 114 lb (51.7 kg)  10/06/21 125 lb 9.6 oz (57 kg)    Physical Exam Constitutional:      General: She is not in acute distress.    Appearance: She is not diaphoretic.  Cardiovascular:     Rate and Rhythm: Normal rate and regular rhythm.     Heart sounds: Normal heart sounds.  Pulmonary:     Effort: Pulmonary effort is normal.     Breath sounds: Normal breath sounds.  Abdominal:     General: Bowel sounds are normal. There is no distension.     Palpations: Abdomen is soft.     Tenderness: There is abdominal tenderness (Patient has abdominal tenderness throughout though it is worse in the left mid and left lower quadrant). There is no guarding.  Skin:    General: Skin is warm and dry.  Neurological:     Mental Status: She is alert.      Assessment/Plan: Please see individual problem list.  Essential hypertension Assessment & Plan: Chronic issue.  Well-controlled.  She will continue trandolapril 4 mg daily.  Check labs.   Mixed hyperlipidemia Assessment & Plan: Chronic issue.  Continue Crestor 20 mg daily.  Check lipid panel.  Orders: -     Lipid panel  Abdominal pain, unspecified abdominal location Assessment & Plan: Chronic issue.  With vomiting and reduced appetite as well as night sweats.  This continues to be a problem.  Discussed that we need a CT scan to evaluate for further causes of this.  CT scan will be ordered today to be done ASAP.  Will also get lab work to evaluate for underlying causes.    Chronic midline thoracic back pain Assessment & Plan: Chronic issue.  Worsened recently.  Discussed her CT scan would also image her back in the area of concern.   Unintentional weight loss Assessment & Plan: Weight has been trending down.  She has not been eating which may be related to what ever is causing her abdominal discomfort.  Discussed getting cancer screening  up-to-date with colon cancer screening and mammogram.  Both of those orders will be placed.  Will have her return for her Pap smear.  CT imaging of abdomen ordered.  Lab work ordered as well.  Orders: -     TSH  Left lower quadrant abdominal pain Assessment & Plan: Chronic issue.  With vomiting and reduced appetite as well  as night sweats.  This continues to be a problem.  Discussed that we need a CT scan to evaluate for further causes of this.  CT scan will be ordered today to be done ASAP.  Will also get lab work to evaluate for underlying causes.   Orders: -     Comprehensive metabolic panel -     CT ABDOMEN PELVIS W CONTRAST; Future -     Lipase -     CBC with Differential/Platelet  Encounter for screening mammogram for malignant neoplasm of breast -     3D Screening Mammogram, Left and Right; Future  Colon cancer screening -     Ambulatory referral to Gastroenterology     Return in about 4 weeks (around 07/20/2022).   Marikay Alar, MD [facility]

## 2022-06-22 NOTE — Assessment & Plan Note (Signed)
Chronic issue.  Worsened recently.  Discussed her CT scan would also image her back in the area of concern.

## 2022-06-22 NOTE — Patient Instructions (Signed)
Nice to see you. Please call [phone] to schedule your mammogram. GI should contact you to schedule your colonoscopy. Will contact you with your lab results. Somebody will contact you to set up your CT scan.

## 2022-06-22 NOTE — Assessment & Plan Note (Addendum)
Chronic issue.  With vomiting and reduced appetite as well as night sweats.  This continues to be a problem.  Discussed that we need a CT scan to evaluate for further causes of this.  CT scan will be ordered today to be done ASAP.  Will also get lab work to evaluate for underlying causes.

## 2022-06-22 NOTE — Assessment & Plan Note (Signed)
Chronic issue.  Well-controlled.  She will continue trandolapril 4 mg daily.  Check labs.

## 2022-06-22 NOTE — Telephone Encounter (Signed)
Lft pt vm to call ofc to sch CT. thanks 

## 2022-06-22 NOTE — Assessment & Plan Note (Signed)
Chronic issue.  Continue Crestor 20 mg daily.  Check lipid panel. 

## 2022-06-23 ENCOUNTER — Telehealth: Payer: Self-pay

## 2022-06-23 ENCOUNTER — Other Ambulatory Visit: Payer: Self-pay | Admitting: Family Medicine

## 2022-06-23 ENCOUNTER — Other Ambulatory Visit: Payer: Self-pay

## 2022-06-23 DIAGNOSIS — Z1211 Encounter for screening for malignant neoplasm of colon: Secondary | ICD-10-CM

## 2022-06-23 DIAGNOSIS — Z86010 Personal history of colonic polyps: Secondary | ICD-10-CM

## 2022-06-23 DIAGNOSIS — E876 Hypokalemia: Secondary | ICD-10-CM

## 2022-06-23 LAB — CBC WITH DIFFERENTIAL/PLATELET
Basophils Absolute: 0.1 10*3/uL (ref 0.0–0.1)
Basophils Relative: 0.9 % (ref 0.0–3.0)
Eosinophils Absolute: 0.1 10*3/uL (ref 0.0–0.7)
Eosinophils Relative: 1.7 % (ref 0.0–5.0)
HCT: 39.4 % (ref 36.0–46.0)
Hemoglobin: 13.7 g/dL (ref 12.0–15.0)
Lymphocytes Relative: 36.3 % (ref 12.0–46.0)
Lymphs Abs: 2.4 10*3/uL (ref 0.7–4.0)
MCHC: 34.7 g/dL (ref 30.0–36.0)
MCV: 95.9 fl (ref 78.0–100.0)
Monocytes Absolute: 0.3 10*3/uL (ref 0.1–1.0)
Monocytes Relative: 4.6 % (ref 3.0–12.0)
Neutro Abs: 3.8 10*3/uL (ref 1.4–7.7)
Neutrophils Relative %: 56.5 % (ref 43.0–77.0)
Platelets: 213 10*3/uL (ref 150.0–400.0)
RBC: 4.11 Mil/uL (ref 3.87–5.11)
RDW: 12.9 % (ref 11.5–15.5)
WBC: 6.7 10*3/uL (ref 4.0–10.5)

## 2022-06-23 LAB — LIPID PANEL
Cholesterol: 90 mg/dL (ref 0–200)
HDL: 43.5 mg/dL (ref >39.00)
LDL Cholesterol: 31 mg/dL (ref 0–99)
NonHDL: 46.71
Total CHOL/HDL Ratio: 2
Triglycerides: 78 mg/dL (ref 0.0–149.0)
VLDL: 15.6 mg/dL (ref 0.0–40.0)

## 2022-06-23 LAB — COMPREHENSIVE METABOLIC PANEL
ALT: 8 U/L (ref 0–35)
AST: 15 U/L (ref 0–37)
Albumin: 4.1 g/dL (ref 3.5–5.2)
Alkaline Phosphatase: 83 U/L (ref 39–117)
BUN: 11 mg/dL (ref 6–23)
CO2: 31 mEq/L (ref 19–32)
Calcium: 9.5 mg/dL (ref 8.4–10.5)
Chloride: 103 mEq/L (ref 96–112)
Creatinine, Ser: 0.51 mg/dL (ref 0.40–1.20)
GFR: 103.79 mL/min (ref >60.00)
Glucose, Bld: 81 mg/dL (ref 70–99)
Potassium: 2.9 mEq/L — ABNORMAL LOW (ref 3.5–5.1)
Sodium: 142 mEq/L (ref 135–145)
Total Bilirubin: 0.4 mg/dL (ref 0.2–1.2)
Total Protein: 6.4 g/dL (ref 6.0–8.3)

## 2022-06-23 LAB — TSH: TSH: 0.82 u[IU]/mL (ref 0.35–5.50)

## 2022-06-23 LAB — LIPASE: Lipase: 14 U/L (ref 11.0–59.0)

## 2022-06-23 MED ORDER — NA SULFATE-K SULFATE-MG SULF 17.5-3.13-1.6 GM/177ML PO SOLN: 1.0000 | Freq: Once | ORAL | 0 refills | Status: AC

## 2022-06-23 MED ORDER — POTASSIUM CHLORIDE CRYS ER 20 MEQ PO TBCR
40.0000 meq | EXTENDED_RELEASE_TABLET | Freq: Every day | ORAL | 0 refills | Status: DC
Start: 2022-06-23 — End: 2022-08-10

## 2022-06-23 NOTE — Telephone Encounter (Signed)
Left message to call the office back.

## 2022-06-23 NOTE — Telephone Encounter (Signed)
-----   Message from Eric G Sonnenberg, MD sent at 06/23/2022 11:12 AM EDT ----- Please let the patient know her potassium is low.  I sent a potassium supplement to her pharmacy.  This will need to be rechecked in 1 week.  Orders placed.  Her other lab work is acceptable.  She needs to have the CT scan of her abdomen and pelvis as scheduled. 

## 2022-06-23 NOTE — Telephone Encounter (Signed)
Gastroenterology Pre-Procedure Review  Request Date: 07/07/22 Requesting Physician: Dr. Tobi Bastos  PATIENT REVIEW QUESTIONS: The patient responded to the following health history questions as indicated:    1. Are you having any GI issues? no 2. Do you have a personal history of Polyps? yes (last colonoscopy performed with Dr. Tobi Bastos 02/12/17) 3. Do you have a family history of Colon Cancer or Polyps? no 4. Diabetes Mellitus? no 5. Joint replacements in the past 12 months?no 6. Major health problems in the past 3 months?no 7. Any artificial heart valves, MVP, or defibrillator?no    MEDICATIONS & ALLERGIES:    Patient reports the following regarding taking any anticoagulation/antiplatelet therapy:   Plavix, Coumadin, Eliquis, Xarelto, Lovenox, Pradaxa, Brilinta, or Effient? no Aspirin? no  Patient confirms/reports the following medications:  Current Outpatient Medications  Medication Sig Dispense Refill   busPIRone (BUSPAR) 10 MG tablet TAKE 1 TABLET BY MOUTH TWICE A DAY 60 tablet 3   donepezil (ARICEPT) 10 MG tablet TAKE 1 TABLET BY MOUTH AT BEDTIME 90 tablet 1   DULoxetine (CYMBALTA) 30 MG capsule Take 1 capsule (30 mg total) by mouth daily. 7 capsule 0   gabapentin (NEURONTIN) 100 MG capsule TAKE 1 CAPSULE 3 TIMES DAILY FOR 7 DAYS,THEN 2 CAPSULE 3 TIMES DAILY FOR 7 DAYS,THEN 3 CAPSULE 3 TIMES DAILY THEREAFTER 270 capsule 0   memantine (NAMENDA) 10 MG tablet Take 1 tablet (10 mg total) by mouth 2 (two) times daily. 180 tablet 1   nortriptyline (PAMELOR) 10 MG capsule Take 1 capsule (10 mg total) by mouth at bedtime. 30 capsule 5   omeprazole (PRILOSEC) 20 MG capsule TAKE 1 CAPSULE BY MOUTH ONCE DAILY 30 capsule 2   potassium chloride SA (KLOR-CON M) 20 MEQ tablet Take 2 tablets (40 mEq total) by mouth daily for 3 days. 6 tablet 0   predniSONE (DELTASONE) 20 MG tablet Take 2 tablets (40 mg total) by mouth daily with breakfast. 10 tablet 0   rosuvastatin (CRESTOR) 20 MG tablet TAKE ONE TABLET  BY MOUTH ONCE A DAY 30 tablet 3   SYRINGE-NEEDLE, DISP, 3 ML (B-D 3CC LUER-LOK SYR 25GX1") 25G X 1" 3 ML MISC USE ONCE WEEKLY WITH B12 INJECTION FOR 3 WEEKS, THEN ONCE MONTHLY. 7 each 0   tiZANidine (ZANAFLEX) 2 MG tablet Take 1 tablet (2 mg total) by mouth every 6 (six) hours as needed for muscle spasms. 30 tablet 5   trandolapril (MAVIK) 4 MG tablet TAKE ONE TABLET BY MOUTH DAILY 90 tablet 1   No current facility-administered medications for this visit.    Patient confirms/reports the following allergies:  Allergies  Allergen Reactions   Thiazide-Type Diuretics     Hypokalemia, Hyponatremia, Chest pain   Mushroom Extract Complex    Sulfa Antibiotics     No orders of the defined types were placed in this encounter.   AUTHORIZATION INFORMATION Primary Insurance: 1D#: Group #:  Secondary Insurance: 1D#: Group #:  SCHEDULE INFORMATION: Date: 07/07/22 Time: Location: [facility]

## 2022-06-24 ENCOUNTER — Telehealth: Payer: Self-pay

## 2022-06-24 ENCOUNTER — Telehealth: Payer: Self-pay | Admitting: Family Medicine

## 2022-06-24 ENCOUNTER — Telehealth: Payer: Self-pay | Admitting: *Deleted

## 2022-06-24 MED ORDER — PEG 3350-KCL-NABCB-NACL-NASULF 236 G PO SOLR: 4000.0000 mL | Freq: Once | ORAL | 0 refills | Status: AC

## 2022-06-24 NOTE — Telephone Encounter (Signed)
New message   Return call from yesterday.

## 2022-06-24 NOTE — Telephone Encounter (Signed)
-----   Message from Eric G Sonnenberg, MD sent at 06/23/2022 11:12 AM EDT ----- Please let the patient know her potassium is low.  I sent a potassium supplement to her pharmacy.  This will need to be rechecked in 1 week.  Orders placed.  Her other lab work is acceptable.  She needs to have the CT scan of her abdomen and pelvis as scheduled. 

## 2022-06-24 NOTE — Telephone Encounter (Signed)
Layne from [facility] pharmacy called stating that Suprep is too expensive and patient does not have prescription coverage.  Sent Rx for Golytely because it is cheaper out of pocket.  New instructions will be sent and available on MyChart

## 2022-06-24 NOTE — Telephone Encounter (Signed)
Left message to call the office back regarding her lab results 

## 2022-06-25 NOTE — Telephone Encounter (Signed)
-----   Message from Glori Luis, MD sent at 06/23/2022 11:12 AM EDT ----- Please let the patient know her potassium is low.  I sent a potassium supplement to her pharmacy.  This will need to be rechecked in 1 week.  Orders placed.  Her other lab work is acceptable.  She needs to have the CT scan of her abdomen and pelvis as scheduled.

## 2022-06-25 NOTE — Telephone Encounter (Signed)
Spoke to Patient regarding her lab results and she is scheduled to recheck her potassium on 07/02/22 at 10:30

## 2022-06-25 NOTE — Telephone Encounter (Signed)
Left message to call the office back regarding her lab results and to schedule for a potassium recheck in 1 week

## 2022-06-25 NOTE — Telephone Encounter (Signed)
F/u   Rtn call back to CMA  

## 2022-06-30 ENCOUNTER — Ambulatory Visit
Admission: RE | Admit: 2022-06-30 | Discharge: 2022-06-30 | Disposition: A | Discharge status: Home / Self Care | Payer: Medicare Other | Source: Ambulatory Visit | Attending: Family Medicine | Admitting: Family Medicine

## 2022-06-30 ENCOUNTER — Encounter: Payer: Self-pay | Admitting: Gastroenterology

## 2022-06-30 DIAGNOSIS — R1032 Left lower quadrant pain: Secondary | ICD-10-CM | POA: Insufficient documentation

## 2022-06-30 MED ORDER — IOHEXOL 300 MG/ML  SOLN
100.0000 mL | Freq: Once | INTRAMUSCULAR | Status: AC | PRN
  Administered 2022-06-30: 100 mL via INTRAVENOUS

## 2022-06-30 MED ORDER — IOHEXOL 9 MG/ML PO SOLN: 1000.0000 mL | Freq: Once | ORAL | Status: DC | PRN

## 2022-07-02 ENCOUNTER — Other Ambulatory Visit (INDEPENDENT_AMBULATORY_CARE_PROVIDER_SITE_OTHER): Payer: Medicare Other

## 2022-07-02 DIAGNOSIS — E876 Hypokalemia: Secondary | ICD-10-CM | POA: Diagnosis not present

## 2022-07-02 LAB — POTASSIUM: Potassium: 3.8 mEq/L (ref 3.5–5.1)

## 2022-07-03 ENCOUNTER — Telehealth: Payer: Self-pay

## 2022-07-03 NOTE — Telephone Encounter (Signed)
Pt called back and I read the message to her and she has set up a lab appointment on 5/28

## 2022-07-03 NOTE — Telephone Encounter (Signed)
-----   Message from Glori Luis, MD sent at 07/03/2022 10:36 AM EDT ----- Please let the patient know her potassium is now acceptable.  She needs to make sure she is getting plenty of potassium rich foods in her diet.  I would suggest rechecking her potassium in 1 month to determine if this is going to be a recurrent issue that will require daily supplementation.

## 2022-07-03 NOTE — Telephone Encounter (Signed)
LMTCB in regards to lab results.  

## 2022-07-06 ENCOUNTER — Other Ambulatory Visit: Payer: Self-pay | Admitting: Family Medicine

## 2022-07-06 ENCOUNTER — Telehealth: Payer: Self-pay | Admitting: *Deleted

## 2022-07-06 DIAGNOSIS — G8929 Other chronic pain: Secondary | ICD-10-CM

## 2022-07-06 NOTE — Telephone Encounter (Signed)
Patient's sister called endo unit that patient need to reschedule because she is sick.  I have called patient and we have reschedule to Thursday 07/23/2022.  Called endo unit to make the change.  New instructions will be sent.

## 2022-07-14 ENCOUNTER — Encounter: Payer: Self-pay | Admitting: Gastroenterology

## 2022-07-14 ENCOUNTER — Encounter: Payer: Self-pay | Admitting: Physician Assistant

## 2022-07-14 ENCOUNTER — Ambulatory Visit: Payer: Medicare Other | Admitting: Physician Assistant

## 2022-07-14 NOTE — Progress Notes (Deleted)
Celso Amy, PA-C [address]  [address]  [place], [place] [address]  Main: [phone]  Fax: [phone]   Gastroenterology Consultation  Referring Provider:     Glori Luis, MD Primary Care Physician:  Glori Luis, MD Primary Gastroenterologist:  Dr. Wyline Mood  Reason for Consultation:     Chronic abdominal pain        HPI:   Christina Galvan is a 57 y.o. y/o female referred for consultation & management  by Glori Luis, MD.    She has history of colon polyps.  She is scheduled for repeat colonoscopy with Dr. Tobi Bastos 07/23/2022.  Colonoscopy by Dr. Tobi Bastos 02/2017 showed medium nonbleeding internal hemorrhoids and 1 small 3 mm polyp removed from sigmoid colon.  Good prep.  Abdominal pelvic CT 06/30/2022 (to evaluate LLQ pain) showed no acute abnormality.  IUD was in place.  No evidence of diverticulitis.  Labs/15/24 showed normal CMP, CBC, lipase, and TSH except for low potassium 2.9.  Normal hemoglobin 13.7.  Normal LFTs.  Normal white count 6.7.  She was prescribed potassium supplement.  Repeat lab 07/02/2022 showed normal improved potassium 3.8.  Past Medical History:  Diagnosis Date   Anxiety    Anxiety and depression    Depression with anxiety    GERD (gastroesophageal reflux disease)    History of frequent urinary tract infections    Hypertension    Kidney stones    Migraines     Past Surgical History:  Procedure Laterality Date   carpal tunnel     CESAREAN SECTION  1993   CHOLECYSTECTOMY  1994   COLONOSCOPY WITH PROPOFOL N/A 02/12/2017   Procedure: COLONOSCOPY WITH PROPOFOL;  Surgeon: Wyline Mood, MD;  Location: [facility] ENDOSCOPY;  Service: Gastroenterology;  Laterality: N/A;    Prior to Admission medications   Medication Sig Start Date End Date Taking? Authorizing Provider  busPIRone (BUSPAR) 10 MG tablet TAKE 1 TABLET BY MOUTH TWICE A DAY 04/20/22   Glori Luis, MD  donepezil (ARICEPT) 10 MG tablet TAKE 1 TABLET BY MOUTH AT  BEDTIME 02/18/22   Everlena Cooper, Adam R, DO  DULoxetine (CYMBALTA) 30 MG capsule Take 1 capsule (30 mg total) by mouth daily. 06/01/22   Everlena Cooper, Adam R, DO  gabapentin (NEURONTIN) 100 MG capsule TAKE 1 CAPSULE 3 TIMES DAILY FOR 7 DAYS,THEN 2 CAPSULE 3 TIMES DAILY FOR 7 DAYS,THEN 3 CAPSULE 3 TIMES DAILY THEREAFTER 08/19/21   Glori Luis, MD  memantine (NAMENDA) 10 MG tablet Take 1 tablet (10 mg total) by mouth 2 (two) times daily. 07/21/21   Drema Dallas, DO  nortriptyline (PAMELOR) 10 MG capsule Take 1 capsule (10 mg total) by mouth at bedtime. 06/01/22   Drema Dallas, DO  omeprazole (PRILOSEC) 20 MG capsule TAKE 1 CAPSULE BY MOUTH ONCE DAILY 11/17/21   Glori Luis, MD  potassium chloride SA (KLOR-CON M) 20 MEQ tablet Take 2 tablets (40 mEq total) by mouth daily for 3 days. 06/23/22 06/26/22  Glori Luis, MD  predniSONE (DELTASONE) 20 MG tablet Take 2 tablets (40 mg total) by mouth daily with breakfast. 10/06/21   Glori Luis, MD  rosuvastatin (CRESTOR) 20 MG tablet TAKE ONE TABLET BY MOUTH ONCE A DAY 05/18/22   Glori Luis, MD  SYRINGE-NEEDLE, DISP, 3 ML (B-D 3CC LUER-LOK SYR 25GX1") 25G X 1" 3 ML MISC USE ONCE WEEKLY WITH B12 INJECTION FOR 3 WEEKS, THEN ONCE MONTHLY. 08/04/19   Glori Luis,  MD  tiZANidine (ZANAFLEX) 2 MG tablet Take 1 tablet (2 mg total) by mouth every 6 (six) hours as needed for muscle spasms. 06/01/22   Drema Dallas, DO  trandolapril (MAVIK) 4 MG tablet TAKE ONE TABLET BY MOUTH DAILY 05/26/22   Glori Luis, MD    Family History  Problem Relation Age of Onset   Arthritis Mother    Heart disease Mother    Hypertension Mother    Diabetes Mother    Arrhythmia Mother        A-Fib    Cancer Father        Lung Cancer   Diabetes Brother    Asthma Daughter    Cancer Paternal Uncle        Prostate cancer   Heart disease Maternal Grandfather    Heart disease Paternal Grandfather      Social History   Tobacco Use   Smoking status: Some Days     Packs/day: 0.05    Years: 30.00    Additional pack years: 0.00    Total pack years: 1.50    Types: Cigarettes    Start date: 10/21/1984   Smokeless tobacco: Never  Vaping Use   Vaping Use: Never used  Substance Use Topics   Alcohol use: Not Currently    Alcohol/week: 0.0 standard drinks of alcohol    Comment: Rare   Drug use: No    Allergies as of 07/14/2022 - Review Complete 06/30/2022  Allergen Reaction Noted   Thiazide-type diuretics  12/09/2015   Mushroom extract complex  06/24/2015   Sulfa antibiotics  06/24/2015    Review of Systems:    All systems reviewed and negative except where noted in HPI.   Physical Exam:  There were no vitals taken for this visit. No LMP recorded. (Menstrual status: IUD). Psych:  Alert and cooperative. Normal mood and affect. General:   Alert,  Well-developed, well-nourished, pleasant and cooperative in NAD Head:  Normocephalic and atraumatic. Eyes:  Sclera clear, no icterus.   Conjunctiva pink. Ears:  Normal auditory acuity. Neck:  Supple; no masses or thyromegaly. Lungs:  Respirations even and unlabored.  Clear throughout to auscultation.   No wheezes, crackles, or rhonchi. No acute distress. Heart:  Regular rate and rhythm; no murmurs, clicks, rubs, or gallops. Abdomen:  Normal bowel sounds.  No bruits.  Soft, non-tender and non-distended without masses, hepatosplenomegaly or hernias noted.  No guarding or rebound tenderness.    Neurologic:  Alert and oriented x3;  grossly normal neurologically. Psych:  Alert and cooperative. Normal mood and affect.  Imaging Studies: CT Abdomen Pelvis W Contrast  Result Date: 07/04/2022 CLINICAL DATA:  LLQ abdominal pain EXAM: CT ABDOMEN AND PELVIS WITH CONTRAST TECHNIQUE: Multidetector CT imaging of the abdomen and pelvis was performed using the standard protocol following bolus administration of intravenous contrast. RADIATION DOSE REDUCTION: This exam was performed according to the departmental  dose-optimization program which includes automated exposure control, adjustment of the mA and/or kV according to patient size and/or use of iterative reconstruction technique. CONTRAST:  OMNIPAQUE IOHEXOL 300 MG/ML  SOLN COMPARISON:  October 23, 2020 FINDINGS: Lower chest: No acute abnormality. Hepatobiliary: Status post cholecystectomy. No suspicious focal hepatic lesion. Pancreas: Unremarkable. No pancreatic ductal dilatation or surrounding inflammatory changes. Spleen: Normal in size without focal abnormality. Adrenals/Urinary Tract: Adrenal glands are unremarkable. Kidneys enhance symmetrically. No hydronephrosis. No obstructing nephrolithiasis. Bladder is unremarkable. Stomach/Bowel: No evidence of bowel obstruction. Appendix is normal. Stomach is unremarkable. Vascular/Lymphatic: Atherosclerotic calcifications  of the nonaneurysmal abdominal aorta. No suspicious adenopathy. Reproductive: IUD.  Adnexa are unremarkable. Other: No abdominal wall hernia or abnormality. No abdominopelvic ascites. Musculoskeletal: No acute or significant osseous findings. IMPRESSION: 1. No CT etiology for acute abdominal pain is identified. Aortic Atherosclerosis (ICD10-I70.0). Electronically Signed   By: Meda Klinefelter M.D.   On: 07/04/2022 13:00    Assessment and Plan:   SHEANA ANDARY is a 57 y.o. y/o female has been referred for ***  Follow up in ***  Celso Amy, PA-C    BP check ***

## 2022-07-22 ENCOUNTER — Encounter: Payer: Self-pay | Admitting: Gastroenterology

## 2022-07-23 ENCOUNTER — Ambulatory Visit: Payer: Medicare Other | Admitting: Certified Registered"

## 2022-07-23 ENCOUNTER — Encounter
Admission: RE | Disposition: A | Discharge status: Home / Self Care | Payer: Self-pay | Source: Home / Self Care | Attending: Gastroenterology

## 2022-07-23 ENCOUNTER — Ambulatory Visit
Admission: RE | Admit: 2022-07-23 | Discharge: 2022-07-23 | Disposition: A | Discharge status: Home / Self Care | Payer: Medicare Other | Attending: Gastroenterology | Admitting: Gastroenterology

## 2022-07-23 ENCOUNTER — Encounter: Payer: Self-pay | Admitting: Gastroenterology

## 2022-07-23 DIAGNOSIS — K219 Gastro-esophageal reflux disease without esophagitis: Secondary | ICD-10-CM | POA: Diagnosis not present

## 2022-07-23 DIAGNOSIS — F419 Anxiety disorder, unspecified: Secondary | ICD-10-CM | POA: Diagnosis not present

## 2022-07-23 DIAGNOSIS — J449 Chronic obstructive pulmonary disease, unspecified: Secondary | ICD-10-CM | POA: Insufficient documentation

## 2022-07-23 DIAGNOSIS — D122 Benign neoplasm of ascending colon: Secondary | ICD-10-CM | POA: Diagnosis not present

## 2022-07-23 DIAGNOSIS — F17210 Nicotine dependence, cigarettes, uncomplicated: Secondary | ICD-10-CM | POA: Insufficient documentation

## 2022-07-23 DIAGNOSIS — F32A Depression, unspecified: Secondary | ICD-10-CM | POA: Insufficient documentation

## 2022-07-23 DIAGNOSIS — I10 Essential (primary) hypertension: Secondary | ICD-10-CM | POA: Insufficient documentation

## 2022-07-23 DIAGNOSIS — Z86010 Personal history of colonic polyps: Secondary | ICD-10-CM | POA: Diagnosis not present

## 2022-07-23 DIAGNOSIS — D126 Benign neoplasm of colon, unspecified: Secondary | ICD-10-CM

## 2022-07-23 DIAGNOSIS — K5730 Diverticulosis of large intestine without perforation or abscess without bleeding: Secondary | ICD-10-CM | POA: Diagnosis not present

## 2022-07-23 DIAGNOSIS — Z1211 Encounter for screening for malignant neoplasm of colon: Secondary | ICD-10-CM | POA: Diagnosis present

## 2022-07-23 HISTORY — PX: COLONOSCOPY WITH PROPOFOL: SHX5780

## 2022-07-23 SURGERY — COLONOSCOPY WITH PROPOFOL
Anesthesia: General

## 2022-07-23 MED ORDER — STERILE WATER FOR IRRIGATION IR SOLN
Status: DC | PRN
  Administered 2022-07-23: 100 mL

## 2022-07-23 MED ORDER — PROPOFOL 500 MG/50ML IV EMUL
INTRAVENOUS | Status: DC | PRN
  Administered 2022-07-23: 130 ug/kg/min via INTRAVENOUS

## 2022-07-23 MED ORDER — PROPOFOL 1000 MG/100ML IV EMUL
INTRAVENOUS | Status: AC
  Filled 2022-07-23: qty 100

## 2022-07-23 MED ORDER — SODIUM CHLORIDE 0.9 % IV SOLN
INTRAVENOUS | Status: DC
  Administered 2022-07-23: 1000 mL via INTRAVENOUS

## 2022-07-23 MED ORDER — PROPOFOL 10 MG/ML IV BOLUS
INTRAVENOUS | Status: DC | PRN
  Administered 2022-07-23: 70 mg via INTRAVENOUS

## 2022-07-23 NOTE — Anesthesia Postprocedure Evaluation (Signed)
Anesthesia Post Note  Patient: Christina Galvan  Procedure(s) Performed: COLONOSCOPY WITH PROPOFOL  Patient location during evaluation: Endoscopy Anesthesia Type: General Level of consciousness: awake and alert Pain management: pain level controlled Vital Signs Assessment: post-procedure vital signs reviewed and stable Respiratory status: spontaneous breathing, nonlabored ventilation, respiratory function stable and patient connected to nasal cannula oxygen Cardiovascular status: blood pressure returned to baseline and stable Postop Assessment: no apparent nausea or vomiting Anesthetic complications: no   No notable events documented.   Last Vitals:  Vitals:   07/23/22 0928 07/23/22 0939  BP: (!) 116/53 96/70  Pulse:    Resp:    Temp: (!) 35.6 C   SpO2:      Last Pain:  Vitals:   07/23/22 0939  TempSrc:   PainSc: 0-No pain                 Cleda Mccreedy Brenleigh Collet

## 2022-07-23 NOTE — Op Note (Signed)
[facility] Gastroenterology Patient Name: Christina Galvan Procedure Date: 07/23/2022 7:17 AM MRN: [id number] Account #: [id number] Date of Birth: 1965-03-16 Admit Type: Outpatient Age: 57 Room: [facility] ENDO ROOM 3 Gender: Female Note Status: Finalized Instrument Name: Prentice Docker [id number] Procedure:             Colonoscopy Indications:           Surveillance: Personal history of adenomatous polyps                         on last colonoscopy 5 years ago, Last colonoscopy:                         December 2018 Providers:             Wyline Mood MD, MD Referring MD:          Yehuda Mao. Birdie Sons (Referring MD) Medicines:             Monitored Anesthesia Care Complications:         No immediate complications. Procedure:             Pre-Anesthesia Assessment:                        - Prior to the procedure, a History and Physical was                         performed, and patient medications, allergies and                         sensitivities were reviewed. The patient's tolerance                         of previous anesthesia was reviewed.                        - The risks and benefits of the procedure and the                         sedation options and risks were discussed with the                         patient. All questions were answered and informed                         consent was obtained.                        - ASA Grade Assessment: II - A patient with mild                         systemic disease.                        After obtaining informed consent, the colonoscope was                         passed under direct vision. Throughout the procedure,                         the patient's blood pressure,  pulse, and oxygen                         saturations were monitored continuously. The                         Colonoscope was introduced through the anus and                         advanced to the the cecum, identified by the                          appendiceal orifice. The colonoscopy was performed                         with ease. The patient tolerated the procedure well.                         The quality of the bowel preparation was good. The                         ileocecal valve, appendiceal orifice, and rectum were                         photographed. Findings:      The perianal and digital rectal examinations were normal.      An 8 mm polyp was found in the proximal ascending colon. The polyp was       sessile. The polyp was removed with a cold snare. Resection and       retrieval were complete.      Multiple medium-mouthed diverticula were found in the sigmoid colon.      The exam was otherwise without abnormality on direct and retroflexion       views. Impression:            - One 8 mm polyp in the proximal ascending colon,                         removed with a cold snare. Resected and retrieved.                        - Diverticulosis in the sigmoid colon.                        - The examination was otherwise normal on direct and                         retroflexion views. Recommendation:        - Discharge patient to home (with escort).                        - Resume previous diet.                        - Continue present medications.                        - Await pathology results.                        -  Repeat colonoscopy in 7 years for surveillance based                         on pathology results. Procedure Code(s):     --- Professional ---                        [id number], Colonoscopy, flexible; with removal of                         tumor(s), polyp(s), or other lesion(s) by snare                         technique Diagnosis Code(s):     --- Professional ---                        Z86.010, Personal history of colonic polyps                        D12.2, Benign neoplasm of ascending colon                        K57.30, Diverticulosis of large intestine without                         perforation or abscess  without bleeding CPT copyright 2022 American Medical Association. All rights reserved. The codes documented in this report are preliminary and upon coder review may  be revised to meet current compliance requirements. Wyline Mood, MD Wyline Mood MD, MD 07/23/2022 9:12:06 AM This report has been signed electronically. Number of Addenda: 0 Note Initiated On: 07/23/2022 7:17 AM Scope Withdrawal Time: 0 hours 12 minutes 31 seconds  Total Procedure Duration: 0 hours 18 minutes 55 seconds  Estimated Blood Loss:  Estimated blood loss: none.      [facility]

## 2022-07-23 NOTE — Anesthesia Preprocedure Evaluation (Signed)
Anesthesia Evaluation  Patient identified by MRN, date of birth, ID band Patient awake    Reviewed: Allergy & Precautions, NPO status , Patient's Chart, lab work & pertinent test results  History of Anesthesia Complications Negative for: history of anesthetic complications  Airway Mallampati: III  TM Distance: <3 FB Neck ROM: full    Dental  (+) Chipped, Poor Dentition, Missing   Pulmonary COPD, Current Smoker and Patient abstained from smoking.   Pulmonary exam normal        Cardiovascular Exercise Tolerance: Good hypertension, (-) angina Normal cardiovascular exam     Neuro/Psych  Headaches  negative psych ROS   GI/Hepatic Neg liver ROS,GERD  Controlled,,  Endo/Other  negative endocrine ROS    Renal/GU Renal disease  negative genitourinary   Musculoskeletal   Abdominal   Peds  Hematology negative hematology ROS (+)   Anesthesia Other Findings Past Medical History: No date: Anxiety No date: Anxiety and depression No date: Depression with anxiety No date: GERD (gastroesophageal reflux disease) No date: History of frequent urinary tract infections No date: Hypertension No date: Kidney stones No date: Migraines  Past Surgical History: No date: carpal tunnel 1993: CESAREAN SECTION 1994: CHOLECYSTECTOMY 02/12/2017: COLONOSCOPY WITH PROPOFOL; N/A     Comment:  Procedure: COLONOSCOPY WITH PROPOFOL;  Surgeon: Wyline Mood, MD;  Location: [facility] ENDOSCOPY;  Service:               Gastroenterology;  Laterality: N/A;  BMI    Body Mass Index: 20.37 kg/m      Reproductive/Obstetrics negative OB ROS                             Anesthesia Physical Anesthesia Plan  ASA: 3  Anesthesia Plan: General   Post-op Pain Management:    Induction: Intravenous  PONV Risk Score and Plan: Propofol infusion and TIVA  Airway Management Planned: Natural Airway and Nasal  Cannula  Additional Equipment:   Intra-op Plan:   Post-operative Plan:   Informed Consent: I have reviewed the patients History and Physical, chart, labs and discussed the procedure including the risks, benefits and alternatives for the proposed anesthesia with the patient or authorized representative who has indicated his/her understanding and acceptance.     Dental Advisory Given  Plan Discussed with: Anesthesiologist, CRNA and Surgeon  Anesthesia Plan Comments: (Patient consented for risks of anesthesia including but not limited to:  - adverse reactions to medications - risk of airway placement if required - damage to eyes, teeth, lips or other oral mucosa - nerve damage due to positioning  - sore throat or hoarseness - Damage to heart, brain, nerves, lungs, other parts of body or loss of life  Patient voiced understanding.)       Anesthesia Quick Evaluation

## 2022-07-23 NOTE — H&P (Signed)
Wyline Mood, MD [address], [address], [place], [place], [address] [address], [place], [place], [address] Phone: [phone]  Fax: [phone]  Primary Care Physician:  Glori Luis, MD   Pre-Procedure History & Physical: HPI:  Christina Galvan is a 57 y.o. female is here for an colonoscopy.   Past Medical History:  Diagnosis Date   Anxiety    Anxiety and depression    Depression with anxiety    GERD (gastroesophageal reflux disease)    History of frequent urinary tract infections    Hypertension    Kidney stones    Migraines     Past Surgical History:  Procedure Laterality Date   carpal tunnel     CESAREAN SECTION  1993   CHOLECYSTECTOMY  1994   COLONOSCOPY WITH PROPOFOL N/A 02/12/2017   Procedure: COLONOSCOPY WITH PROPOFOL;  Surgeon: Wyline Mood, MD;  Location: [facility] ENDOSCOPY;  Service: Gastroenterology;  Laterality: N/A;    Prior to Admission medications   Medication Sig Start Date End Date Taking? Authorizing Provider  busPIRone (BUSPAR) 10 MG tablet TAKE 1 TABLET BY MOUTH TWICE A DAY 04/20/22  Yes Glori Luis, MD  donepezil (ARICEPT) 10 MG tablet TAKE 1 TABLET BY MOUTH AT BEDTIME 02/18/22  Yes Jaffe, Adam R, DO  DULoxetine (CYMBALTA) 30 MG capsule Take 1 capsule (30 mg total) by mouth daily. 06/01/22  Yes Jaffe, Adam R, DO  gabapentin (NEURONTIN) 100 MG capsule TAKE 1 CAPSULE 3 TIMES DAILY FOR 7 DAYS,THEN 2 CAPSULE 3 TIMES DAILY FOR 7 DAYS,THEN 3 CAPSULE 3 TIMES DAILY THEREAFTER 08/19/21  Yes Glori Luis, MD  memantine (NAMENDA) 10 MG tablet Take 1 tablet (10 mg total) by mouth 2 (two) times daily. 07/21/21  Yes Everlena Cooper, Adam R, DO  nortriptyline (PAMELOR) 10 MG capsule Take 1 capsule (10 mg total) by mouth at bedtime. 06/01/22  Yes Jaffe, Adam R, DO  omeprazole (PRILOSEC) 20 MG capsule TAKE 1 CAPSULE BY MOUTH ONCE DAILY 11/17/21  Yes Glori Luis, MD  rosuvastatin (CRESTOR) 20 MG tablet TAKE ONE TABLET BY MOUTH ONCE A DAY 05/18/22   Yes Glori Luis, MD  tiZANidine (ZANAFLEX) 2 MG tablet Take 1 tablet (2 mg total) by mouth every 6 (six) hours as needed for muscle spasms. 06/01/22  Yes Jaffe, Adam R, DO  trandolapril (MAVIK) 4 MG tablet TAKE ONE TABLET BY MOUTH DAILY 05/26/22  Yes Glori Luis, MD  potassium chloride SA (KLOR-CON M) 20 MEQ tablet Take 2 tablets (40 mEq total) by mouth daily for 3 days. 06/23/22 06/26/22  Glori Luis, MD  predniSONE (DELTASONE) 20 MG tablet Take 2 tablets (40 mg total) by mouth daily with breakfast. Patient not taking: Reported on 07/23/2022 10/06/21   Glori Luis, MD  SYRINGE-NEEDLE, DISP, 3 ML (B-D 3CC LUER-LOK SYR 25GX1") 25G X 1" 3 ML MISC USE ONCE WEEKLY WITH B12 INJECTION FOR 3 WEEKS, THEN ONCE MONTHLY. 08/04/19   Glori Luis, MD    Allergies as of 06/23/2022 - Review Complete 06/22/2022  Allergen Reaction Noted   Thiazide-type diuretics  12/09/2015   Mushroom extract complex  06/24/2015   Sulfa antibiotics  06/24/2015    Family History  Problem Relation Age of Onset   Arthritis Mother    Heart disease Mother    Hypertension Mother    Diabetes Mother    Arrhythmia Mother        A-Fib    Cancer Father  Lung Cancer   Diabetes Brother    Asthma Daughter    Cancer Paternal Uncle        Prostate cancer   Heart disease Maternal Grandfather    Heart disease Paternal Grandfather     Social History   Socioeconomic History   Marital status: Single    Spouse name: Not on file   Number of children: Not on file   Years of education: Not on file   Highest education level: Not on file  Occupational History   Not on file  Tobacco Use   Smoking status: Every Day    Packs/day: 0.25    Years: 30.00    Additional pack years: 0.00    Total pack years: 7.50    Types: Cigarettes    Start date: 10/21/1984   Smokeless tobacco: Never  Vaping Use   Vaping Use: Never used  Substance and Sexual Activity   Alcohol use: Not Currently    Alcohol/week:  0.0 standard drinks of alcohol    Comment: Rare   Drug use: No   Sexual activity: Not Currently    Partners: Male    Birth control/protection: I.U.D.  Other Topics Concern   Not on file  Social History Narrative   Works at [employer]    Lives with son and daughter   Pets: None   Caffeine- 1 20 oz bottle of soda       Right handed   One story    Social Determinants of Health   Financial Resource Strain: High Risk (01/06/2021)   Overall Financial Resource Strain (CARDIA)    Difficulty of Paying Living Expenses: Hard  Food Insecurity: Not on file  Transportation Needs: Not on file  Physical Activity: Not on file  Stress: Not on file  Social Connections: Not on file  Intimate Partner Violence: Not on file    Review of Systems: See HPI, otherwise negative ROS  Physical Exam: BP 107/82   Pulse 81   Temp (!) 96.5 F (35.8 C) (Temporal)   Resp 17   Ht 5\' 3"  (1.6 m)   Wt 52.2 kg   SpO2 98%   BMI 20.37 kg/m  General:   Alert,  pleasant and cooperative in NAD Head:  Normocephalic and atraumatic. Neck:  Supple; no masses or thyromegaly. Lungs:  Clear throughout to auscultation, normal respiratory effort.    Heart:  +S1, +S2, Regular rate and rhythm, No edema. Abdomen:  Soft, nontender and nondistended. Normal bowel sounds, without guarding, and without rebound.   Neurologic:  Alert and  oriented x4;  grossly normal neurologically.  Impression/Plan: Christina Galvan is here for an colonoscopy to be performed for surveillance due to prior history of colon polyps   Risks, benefits, limitations, and alternatives regarding  colonoscopy have been reviewed with the patient.  Questions have been answered.  All parties agreeable.   Wyline Mood, MD  07/23/2022, 8:43 AM

## 2022-07-23 NOTE — Transfer of Care (Signed)
Immediate Anesthesia Transfer of Care Note  Patient: Christina Galvan  Procedure(s) Performed: COLONOSCOPY WITH PROPOFOL  Patient Location: Endoscopy Unit  Anesthesia Type:General  Level of Consciousness: drowsy  Airway & Oxygen Therapy: Patient Spontanous Breathing  Post-op Assessment: Report given to RN  Post vital signs: stable  Last Vitals:  Vitals Value Taken Time  BP    Temp    Pulse    Resp    SpO2      Last Pain:  Vitals:   07/23/22 0805  TempSrc: Temporal  PainSc: 0-No pain         Complications: No notable events documented.

## 2022-07-24 ENCOUNTER — Encounter: Payer: Self-pay | Admitting: Gastroenterology

## 2022-07-24 LAB — SURGICAL PATHOLOGY

## 2022-07-31 ENCOUNTER — Telehealth: Payer: Self-pay | Admitting: *Deleted

## 2022-07-31 DIAGNOSIS — E876 Hypokalemia: Secondary | ICD-10-CM

## 2022-07-31 NOTE — Telephone Encounter (Signed)
Order placed

## 2022-07-31 NOTE — Telephone Encounter (Signed)
Please place future orders for lab appt.  

## 2022-08-04 ENCOUNTER — Other Ambulatory Visit: Payer: Medicare Other

## 2022-08-10 ENCOUNTER — Telehealth: Payer: Self-pay | Admitting: *Deleted

## 2022-08-10 ENCOUNTER — Encounter: Payer: Self-pay | Admitting: Family Medicine

## 2022-08-10 ENCOUNTER — Ambulatory Visit (INDEPENDENT_AMBULATORY_CARE_PROVIDER_SITE_OTHER): Payer: Medicare Other | Admitting: Family Medicine

## 2022-08-10 VITALS — BP 122/74 | HR 72 | Temp 98.2°F | Ht 63.0 in | Wt 114.6 lb

## 2022-08-10 DIAGNOSIS — F32A Depression, unspecified: Secondary | ICD-10-CM

## 2022-08-10 DIAGNOSIS — G300 Alzheimer's disease with early onset: Secondary | ICD-10-CM

## 2022-08-10 DIAGNOSIS — R109 Unspecified abdominal pain: Secondary | ICD-10-CM

## 2022-08-10 DIAGNOSIS — R634 Abnormal weight loss: Secondary | ICD-10-CM | POA: Diagnosis not present

## 2022-08-10 DIAGNOSIS — F0280 Dementia in other diseases classified elsewhere without behavioral disturbance: Secondary | ICD-10-CM

## 2022-08-10 DIAGNOSIS — F419 Anxiety disorder, unspecified: Secondary | ICD-10-CM

## 2022-08-10 MED ORDER — BUSPIRONE HCL 10 MG PO TABS: 10.0000 mg | ORAL_TABLET | Freq: Two times a day (BID) | ORAL | 3 refills | Status: DC

## 2022-08-10 NOTE — Progress Notes (Signed)
Marikay Alar, MD Phone: [phone]  Christina Galvan is a 57 y.o. female who presents today for f/u.  Weight loss: Patient's weight is stable.  She notes her abdominal pain resolved.  She notes her appetite is not great.  She had a colonoscopy that was reassuring with only a single tubular adenoma.  She had a CT abdomen pelvis that was reassuring.  She notes she only eats 1 meal a day and that at 4 PM.  Typically consists of crackers because she has no money for food.  Social History   Tobacco Use  Smoking Status Every Day   Packs/day: 0.25   Years: 30.00   Additional pack years: 0.00   Total pack years: 7.50   Types: Cigarettes   Start date: 10/21/1984  Smokeless Tobacco Never    Current Outpatient Medications on File Prior to Visit  Medication Sig Dispense Refill   donepezil (ARICEPT) 10 MG tablet TAKE 1 TABLET BY MOUTH AT BEDTIME 90 tablet 1   DULoxetine (CYMBALTA) 30 MG capsule Take 1 capsule (30 mg total) by mouth daily. 7 capsule 0   memantine (NAMENDA) 10 MG tablet Take 1 tablet (10 mg total) by mouth 2 (two) times daily. 180 tablet 1   nortriptyline (PAMELOR) 10 MG capsule Take 1 capsule (10 mg total) by mouth at bedtime. 30 capsule 5   omeprazole (PRILOSEC) 20 MG capsule TAKE 1 CAPSULE BY MOUTH ONCE DAILY 30 capsule 2   rosuvastatin (CRESTOR) 20 MG tablet TAKE ONE TABLET BY MOUTH ONCE A DAY 30 tablet 3   SYRINGE-NEEDLE, DISP, 3 ML (B-D 3CC LUER-LOK SYR 25GX1") 25G X 1" 3 ML MISC USE ONCE WEEKLY WITH B12 INJECTION FOR 3 WEEKS, THEN ONCE MONTHLY. 7 each 0   tiZANidine (ZANAFLEX) 2 MG tablet Take 1 tablet (2 mg total) by mouth every 6 (six) hours as needed for muscle spasms. 30 tablet 5   trandolapril (MAVIK) 4 MG tablet TAKE ONE TABLET BY MOUTH DAILY 90 tablet 1   No current facility-administered medications on file prior to visit.     ROS see history of present illness  Objective  Physical Exam Vitals:   08/10/22 0931  BP: 122/74  Pulse: 72  Temp: 98.2 F  (36.8 C)  SpO2: 98%    BP Readings from Last 3 Encounters:  08/10/22 122/74  07/23/22 (!) 110/55  06/22/22 116/78   Wt Readings from Last 3 Encounters:  08/10/22 114 lb 9.6 oz (52 kg)  07/23/22 115 lb (52.2 kg)  06/22/22 113 lb 6.4 oz (51.4 kg)    Physical Exam Constitutional:      General: She is not in acute distress.    Appearance: She is not diaphoretic.  Cardiovascular:     Rate and Rhythm: Normal rate and regular rhythm.     Heart sounds: Normal heart sounds.  Pulmonary:     Effort: Pulmonary effort is normal.     Breath sounds: Normal breath sounds.  Abdominal:     General: Bowel sounds are normal. There is no distension.     Palpations: Abdomen is soft.     Tenderness: There is no abdominal tenderness.  Skin:    General: Skin is warm and dry.  Neurological:     Mental Status: She is alert.      Assessment/Plan: Please see individual problem list.  Abdominal pain, unspecified abdominal location Assessment & Plan: Chronic issue that has resolved at this time.  CT scan reassuring.  Colonoscopy reassuring.  Monitor for recurrence.  Anxiety and depression -     busPIRone HCl; Take 1 tablet (10 mg total) by mouth 2 (two) times daily.  Dispense: 60 tablet; Refill: 3  Unintentional weight loss Assessment & Plan: Chronic issue.  Suspect related to poor oral intake due to financial reasons.  Prior imaging reassuring.  Colonoscopy up-to-date.  Discussed the need to schedule her mammogram.  We will check with our coding department to figure out if related to her Pap smear given that she has Medicare.  Patient was given [employer] [facility] handout on feeling the hungry in [place].  This has numerous contacts to try to get food.  I also discussed this patient with our practice administrator and she was going to touch base with a social worker to see if we get the patient set up with them.  Chronic care management referral placed as well.  Orders: -      AMB Referral to Chronic Care Management Services  Early onset Alzheimer's dementia without behavioral disturbance, psychotic disturbance, mood disturbance, or anxiety, unspecified dementia severity (HCC) -     AMB Referral to Chronic Care Management Services     Return in about 3 months (around 11/10/2022).   Marikay Alar, MD [facility] Primary Care [facility]

## 2022-08-10 NOTE — Patient Outreach (Signed)
  Care Coordination   08/10/2022 Name: Christina Galvan MRN: [id number] DOB: 06-10-1965   Care Coordination Outreach Attempts:  An unsuccessful telephone outreach was attempted today to offer the patient information about available care coordination services.  Follow Up Plan:  Additional outreach attempts will be made to offer the patient care coordination information and services.   Encounter Outcome:  No Answer   Care Coordination Interventions:  No, not indicated    Phinneas Shakoor, LCSW Clinical Social Worker  [facility] Care Management [phone]

## 2022-08-10 NOTE — Assessment & Plan Note (Addendum)
Chronic issue.  Suspect related to poor oral intake due to financial reasons.  Prior imaging reassuring.  Colonoscopy up-to-date.  Discussed the need to schedule her mammogram.  We will check with our coding department to figure out if related to her Pap smear given that she has Medicare.  Patient was given [employer] [facility] handout on feeling the hungry in [place].  This has numerous contacts to try to get food.  I also discussed this patient with our practice administrator and she was going to touch base with a social worker to see if we get the patient set up with them.  Chronic care management referral placed as well.

## 2022-08-10 NOTE — Patient Instructions (Signed)
[facility] Monday, Wednesday, Friday 1:00pm-3:30pm  [address] [place], [place] [address]

## 2022-08-10 NOTE — Assessment & Plan Note (Signed)
Chronic issue that has resolved at this time.  CT scan reassuring.  Colonoscopy reassuring.  Monitor for recurrence.

## 2022-08-11 ENCOUNTER — Telehealth: Payer: Self-pay | Admitting: *Deleted

## 2022-08-11 ENCOUNTER — Other Ambulatory Visit: Payer: Self-pay | Admitting: Neurology

## 2022-08-12 NOTE — Patient Outreach (Signed)
  Care Coordination   Initial Visit Note   08/12/2022 Late Entry Name: Christina Galvan MRN: [id number] DOB: [birth year]  Christina Galvan is a 57 y.o. year old female who sees Birdie Sons, Yehuda Mao, MD for primary care. I spoke with  Ernesto Rutherford by phone yesterday.  What matters to the patients health and wellness today?  Food resources. Patient states that she lives with her mother and brother. Patient's sister assists with meals and transportation to medical appointments. Patient states that nutrition challenges are due to finances and poor appetite. Patient had food stamps previously but due to only receiving 27.00 let it lapse.  Food bank resources to be mailed to patient's home. CSW will also refer patient to the [employer] [facility] Nutrition Program   Goals Addressed             This Visit's Progress    Food resources       Interventions Today    Flowsheet Row Most Recent Value  Chronic Disease   Chronic disease during today's visit Hypertension (HTN), Other  General Interventions   General Interventions Discussed/Reviewed General Interventions Discussed, [employer]  [patient states that she resides with her mother and brother, sister is main support who helps with meals and transportation to medical appointments]  Nutrition Interventions   Nutrition Discussed/Reviewed Nutrition Discussed, Supplemental nutrition  [discussed referral to the stone, soup menus, however patient does not live in [place], patient to be referred to the [employer] [facility] Nutrition Program]              SDOH assessments and interventions completed:  Yes  SDOH Interventions Today    Flowsheet Row Most Recent Value  SDOH Interventions   Food Insecurity Interventions Other (Comment)  [Pt states that money is an issue for food at times but also does not have an appetite most days. Pt states having food stampsin the past but let it lapse due to the amount provided per  month(27.00) Currently resides with mother and brother, sister helps]  Housing Interventions Intervention Not Indicated  Transportation Interventions --  Dentist and sister provide transportation to medical appointments]        Care Coordination Interventions:  Yes, provided   Follow up plan: Follow up call scheduled for 08/26/22    Encounter Outcome:  Pt. Visit Completed

## 2022-08-12 NOTE — Patient Instructions (Signed)
Visit Information  Thank you for taking time to visit with me today. Please don't hesitate to contact me if I can be of assistance to you.   Following are the goals we discussed today:  Patient to be referred to the community support and nutrition program   Our next appointment is by telephone on 08/26/22 at 2pm  Please call the care guide team at [phone] if you need to cancel or reschedule your appointment.   If you are experiencing a Mental Health or Behavioral Health Crisis or need someone to talk to, please call [phone]   Patient verbalizes understanding of instructions and care plan provided today and agrees to view in MyChart. Active MyChart status and patient understanding of how to access instructions and care plan via MyChart confirmed with patient.     Telephone follow up appointment with care management team member scheduled for: 08/26/22  Verna Czech, LCSW Clinical Social Worker  [facility] Care Management [phone]

## 2022-08-17 ENCOUNTER — Encounter: Payer: Self-pay | Admitting: Family Medicine

## 2022-08-20 NOTE — Telephone Encounter (Signed)
Patient is scheduled for 08/31/22 at 4:30.

## 2022-08-26 ENCOUNTER — Ambulatory Visit: Payer: Self-pay | Admitting: *Deleted

## 2022-08-27 NOTE — Patient Outreach (Signed)
  Care Coordination   Follow Up Visit Note   08/27/2022 Late Entry Name: Christina Galvan MRN: [id number] DOB: 1965/06/08  Christina Galvan is a 57 y.o. year old female who sees Birdie Sons, Yehuda Mao, MD for primary care. I spoke with  Ernesto Rutherford by phone on 08/26/22.  What matters to the patients health and wellness today?  Assistance with food resources    Goals Addressed             This Visit's Progress    Food resources       Interventions Today    Flowsheet Row Most Recent Value  Chronic Disease   Chronic disease during today's visit Hypertension (HTN), Other  [dementia]  General Interventions   General Interventions Discussed/Reviewed General Interventions Reviewed  [patient continues to report receiving assistance with meals from her sister and daughter, patient agreeable to referral to the community support and nutrition program-groceries will need to be delivered as patient does not drive]              SDOH assessments and interventions completed:  No     Care Coordination Interventions:  Yes, provided   Follow up plan: Follow up call scheduled for 09/11/22    Encounter Outcome:  Pt. Visit Completed

## 2022-08-27 NOTE — Patient Instructions (Signed)
Visit Information  Thank you for taking time to visit with me today. Please don't hesitate to contact me if I can be of assistance to you.   Following are the goals we discussed today:  Referral to be completed for the    Our next appointment is by telephone on 09/11/22 at 11am  Please call the care guide team at [phone] if you need to cancel or reschedule your appointment.   If you are experiencing a Mental Health or Behavioral Health Crisis or need someone to talk to, please call 911   Patient verbalizes understanding of instructions and care plan provided today and agrees to view in MyChart. Active MyChart status and patient understanding of how to access instructions and care plan via MyChart confirmed with patient.     Telephone follow up appointment with care management team member scheduled for: 09/11/22  Verna Czech, LCSW Clinical Social Worker  [facility] Care Management [phone]

## 2022-08-31 ENCOUNTER — Encounter: Payer: Self-pay | Admitting: Family Medicine

## 2022-08-31 ENCOUNTER — Ambulatory Visit (INDEPENDENT_AMBULATORY_CARE_PROVIDER_SITE_OTHER): Payer: Medicare Other | Admitting: Family Medicine

## 2022-08-31 VITALS — BP 114/68 | HR 77 | Temp 98.2°F | Ht 63.0 in | Wt 115.2 lb

## 2022-08-31 DIAGNOSIS — F419 Anxiety disorder, unspecified: Secondary | ICD-10-CM

## 2022-08-31 DIAGNOSIS — M791 Myalgia, unspecified site: Secondary | ICD-10-CM | POA: Diagnosis not present

## 2022-08-31 DIAGNOSIS — F32A Depression, unspecified: Secondary | ICD-10-CM

## 2022-08-31 NOTE — Patient Instructions (Signed)
We will contact you with the plan for your anxiety medication once I come up with a plan.

## 2022-08-31 NOTE — Progress Notes (Unsigned)
Marikay Alar, MD Phone: [phone]  Christina Galvan is a 57 y.o. female who presents today for f/u.  Anxiety: Patient reports this is getting worse.  She does report some depression as well.  No SI.  She has been taking buspirone.  Her sister reports she is still taking Cymbalta though it looks like her neurologist wanted her to taper off of this as they started her on nortriptyline to help with her headaches.  She is interested in altering her regimen.  She does not want to get into why her anxiety is worse.  She does not want to see a therapist.     08/31/2022    4:48 PM 08/11/2022   10:40 AM 08/10/2022    9:33 AM 06/22/2022    3:52 PM 10/06/2021   11:31 AM  Depression screen PHQ 2/9  Decreased Interest 2 0 0 0 0  Down, Depressed, Hopeless 2 0 0 0 0  PHQ - 2 Score 4 0 0 0 0  Altered sleeping 2 0 0 0   Tired, decreased energy 3  0 0   Change in appetite 2  0 0   Feeling bad or failure about yourself  1  0 0   Trouble concentrating 1  0 0   Moving slowly or fidgety/restless 3  0 0   Suicidal thoughts 0  0 0   PHQ-9 Score 16 0 0 0   Difficult doing work/chores Not difficult at all  Not difficult at all Not difficult at all       08/31/2022    4:49 PM 08/10/2022    9:33 AM 06/22/2022    3:52 PM 12/13/2020   11:01 AM  GAD 7 : Generalized Anxiety Score  Nervous, Anxious, on Edge 1 0 0 3  Control/stop worrying 3 0 0 3  Worry too much - different things 3 0 0 3  Trouble relaxing 0 0 0 3  Restless 3 0 0 3  Easily annoyed or irritable 0 0 0 3  Afraid - awful might happen 0 0 0 0  Total GAD 7 Score 10 0 0 18  Anxiety Difficulty Not difficult at all Not difficult at all Not difficult at all    Left leg pain: This is a chronic issue.  Not to get worse last week.  Feels like a gouging discomfort in her left anterior thigh.  She has a history of sciatica though this feels a little different.  She notes no injury.   Social History   Tobacco Use  Smoking Status Every Day    Packs/day: 0.25   Years: 30.00   Additional pack years: 0.00   Total pack years: 7.50   Types: Cigarettes   Start date: 10/21/1984  Smokeless Tobacco Never    Current Outpatient Medications on File Prior to Visit  Medication Sig Dispense Refill   busPIRone (BUSPAR) 10 MG tablet Take 1 tablet (10 mg total) by mouth 2 (two) times daily. 60 tablet 3   donepezil (ARICEPT) 10 MG tablet TAKE 1 TABLET BY MOUTH AT BEDTIME 90 tablet 1   DULoxetine (CYMBALTA) 30 MG capsule Take 1 capsule (30 mg total) by mouth daily. 7 capsule 0   memantine (NAMENDA) 10 MG tablet TAKE ONE TABLET BY MOUTH TWO TIMES DAILY 180 tablet 1   nortriptyline (PAMELOR) 10 MG capsule Take 1 capsule (10 mg total) by mouth at bedtime. 30 capsule 5   omeprazole (PRILOSEC) 20 MG capsule TAKE 1 CAPSULE BY MOUTH ONCE DAILY  30 capsule 2   rosuvastatin (CRESTOR) 20 MG tablet TAKE ONE TABLET BY MOUTH ONCE A DAY 30 tablet 3   SYRINGE-NEEDLE, DISP, 3 ML (B-D 3CC LUER-LOK SYR 25GX1") 25G X 1" 3 ML MISC USE ONCE WEEKLY WITH B12 INJECTION FOR 3 WEEKS, THEN ONCE MONTHLY. 7 each 0   tiZANidine (ZANAFLEX) 2 MG tablet Take 1 tablet (2 mg total) by mouth every 6 (six) hours as needed for muscle spasms. 30 tablet 5   trandolapril (MAVIK) 4 MG tablet TAKE ONE TABLET BY MOUTH DAILY 90 tablet 1   No current facility-administered medications on file prior to visit.     ROS see history of present illness  Objective  Physical Exam Vitals:   08/31/22 1646  BP: 114/68  Pulse: 77  Temp: 98.2 F (36.8 C)  SpO2: 98%    BP Readings from Last 3 Encounters:  08/31/22 114/68  08/10/22 122/74  07/23/22 (!) 110/55   Wt Readings from Last 3 Encounters:  08/31/22 115 lb 3.2 oz (52.3 kg)  08/10/22 114 lb 9.6 oz (52 kg)  07/23/22 115 lb (52.2 kg)    Physical Exam Constitutional:      General: She is not in acute distress.    Appearance: She is not diaphoretic.  Cardiovascular:     Rate and Rhythm: Normal rate and regular rhythm.     Heart  sounds: Normal heart sounds.  Pulmonary:     Effort: Pulmonary effort is normal.     Breath sounds: Normal breath sounds.  Musculoskeletal:       Legs:  Skin:    General: Skin is warm and dry.  Neurological:     Mental Status: She is alert.      Assessment/Plan: Please see individual problem list.  Anxiety and depression Assessment & Plan: Chronic issue.  Worsening.  She will continue buspirone 10 mg twice daily.  I will clarify with our clinical pharmacist whether or not Cymbalta and nortriptyline combination is acceptable.  We may need to have her stop the Cymbalta and increase nortriptyline as outlined in the patient's neurologist note.   Myalgia Assessment & Plan: Chronic.  Patient with focal myalgia in her left leg.  Oddly there is radiating pain with palpation of her left thigh.  We will check a CK tomorrow and this lab appointment was scheduled.  Once we see that result we can figure out the neck step in management.  Orders: -     CK; Future     Return in about 6 weeks (around 10/12/2022) for anxiety.   Marikay Alar, MD [facility] Primary Care [facility]

## 2022-09-01 ENCOUNTER — Other Ambulatory Visit (INDEPENDENT_AMBULATORY_CARE_PROVIDER_SITE_OTHER): Payer: Medicare Other

## 2022-09-01 DIAGNOSIS — M791 Myalgia, unspecified site: Secondary | ICD-10-CM | POA: Insufficient documentation

## 2022-09-01 DIAGNOSIS — E876 Hypokalemia: Secondary | ICD-10-CM | POA: Diagnosis not present

## 2022-09-01 NOTE — Assessment & Plan Note (Signed)
Chronic.  Patient with focal myalgia in her left leg.  Oddly there is radiating pain with palpation of her left thigh.  We will check a CK tomorrow and this lab appointment was scheduled.  Once we see that result we can figure out the neck step in management.

## 2022-09-01 NOTE — Assessment & Plan Note (Signed)
Chronic issue.  Worsening.  She will continue buspirone 10 mg twice daily.  I will clarify with our clinical pharmacist whether or not Cymbalta and nortriptyline combination is acceptable.  We may need to have her stop the Cymbalta and increase nortriptyline as outlined in the patient's neurologist note.

## 2022-09-02 ENCOUNTER — Encounter: Payer: Self-pay | Admitting: Family Medicine

## 2022-09-02 LAB — POTASSIUM: Potassium: 3.2 mEq/L — ABNORMAL LOW (ref 3.5–5.1)

## 2022-09-02 LAB — CK: Total CK: 52 U/L (ref 7–177)

## 2022-09-02 NOTE — Addendum Note (Signed)
Addended by: Warden Fillers on: 09/02/2022 12:46 PM   Modules accepted: Orders

## 2022-09-03 ENCOUNTER — Telehealth: Payer: Self-pay

## 2022-09-03 DIAGNOSIS — E876 Hypokalemia: Secondary | ICD-10-CM

## 2022-09-03 MED ORDER — POTASSIUM CHLORIDE CRYS ER 20 MEQ PO TBCR
20.0000 meq | EXTENDED_RELEASE_TABLET | Freq: Every day | ORAL | 1 refills | Status: DC
Start: 2022-09-03 — End: 2022-10-28

## 2022-09-03 NOTE — Telephone Encounter (Signed)
Pt returned [facility] CMA call. Note below was read to her. Pt aware and understood. Pt agree for the potasium sup and the ortho referral. Pt will like to f/u with elizabeth CMA.

## 2022-09-03 NOTE — Telephone Encounter (Signed)
-----   Message from Glori Luis, MD sent at 09/02/2022  2:21 PM EDT ----- Please let the patient know her potassium is mildly low.  She needs to increase potassium rich foods in her diet.  We could add a potassium supplement for her to take on a daily basis as well and recheck this in a week.  The muscle enzyme we checked was in the normal range.  I would suggest referral to orthopedics to get their input on the pain that she is having in her leg.  We can also increase her nortriptyline to 25 mg daily given that she is no longer on the Cymbalta.  I can send that in once you speak to her.

## 2022-09-03 NOTE — Telephone Encounter (Signed)
Left message to call the office back regarding her lab results 

## 2022-09-03 NOTE — Telephone Encounter (Signed)
Ordered and lab ordered.

## 2022-09-03 NOTE — Telephone Encounter (Signed)
Patient is agreeable to the Potassium supplement please send to [facility] ortho referral. Patient is scheduled for a potassium recheck on 09/11/22.

## 2022-09-11 ENCOUNTER — Other Ambulatory Visit: Payer: Medicare Other

## 2022-09-11 ENCOUNTER — Encounter: Payer: Self-pay | Admitting: *Deleted

## 2022-09-11 ENCOUNTER — Telehealth: Payer: Self-pay | Admitting: *Deleted

## 2022-09-11 NOTE — Patient Outreach (Signed)
Phone call to the [employer] and Nutrition Program to discuss referral. VM left for a return call.  Verna Czech, LCSW Clinical Social Worker  [facility] Care Management [phone]

## 2022-09-11 NOTE — Patient Outreach (Signed)
  Care Coordination   09/11/2022 Name: Christina Galvan MRN: [id number] DOB: 1965/12/06   Care Coordination Outreach Attempts:  An unsuccessful telephone outreach was attempted today to offer the patient information about available care coordination services.  Follow Up Plan:  Additional outreach attempts will be made to offer the patient care coordination information and services.   Encounter Outcome:  No Answer   Care Coordination Interventions:  No, not indicated    Miracle Criado, LCSW Clinical Social Ecologist [facility] Care Management [phone]

## 2022-09-15 ENCOUNTER — Other Ambulatory Visit: Payer: Self-pay | Admitting: Neurology

## 2022-09-16 ENCOUNTER — Other Ambulatory Visit: Payer: Medicare Other

## 2022-09-23 ENCOUNTER — Telehealth: Payer: Self-pay

## 2022-09-23 NOTE — Progress Notes (Signed)
  Chronic Care Management   Note  09/23/2022 Name: KEELEY SUSSMAN MRN: [id number] DOB: Nov 03, 1965  ULONDA KLOSOWSKI is a 57 y.o. year old female who is a primary care patient of Birdie Sons, Yehuda Mao, MD. I reached out to Ernesto Rutherford by phone today in response to a referral sent by Ms. Lupe Carney Engdahl's PCP.  Ms. Kildow was given information about Chronic Care Management services today including:  CCM service includes personalized support from designated clinical staff supervised by the physician, including individualized plan of care and coordination with other care providers 24/7 contact phone numbers for assistance for urgent and routine care needs. Service will only be billed when office clinical staff spend 20 minutes or more in a month to coordinate care. Only one practitioner may furnish and bill the service in a calendar month. The patient may stop CCM services at amy time (effective at the end of the month) by phone call to the office staff. The patient will be responsible for cost sharing (co-pay) or up to 20% of the service fee (after annual deductible is met)  Ms. Ernesto Rutherford  agreedto scheduling an appointment with the CCM RN Case Manager   Follow up plan: Patient agreed to scheduled appointment with RN Case Manager on 09/30/2022(date/time).   Penne Lash, RMA Care Guide [facility]  [place], [place] [address] Direct Dial: [phone] [email or web address]

## 2022-09-28 ENCOUNTER — Other Ambulatory Visit (INDEPENDENT_AMBULATORY_CARE_PROVIDER_SITE_OTHER): Payer: Medicare Other

## 2022-09-28 DIAGNOSIS — E876 Hypokalemia: Secondary | ICD-10-CM | POA: Diagnosis not present

## 2022-09-28 LAB — POTASSIUM: Potassium: 3.7 meq/L (ref 3.5–5.1)

## 2022-09-30 ENCOUNTER — Telehealth: Payer: Medicare Other

## 2022-09-30 ENCOUNTER — Telehealth: Payer: Self-pay

## 2022-09-30 NOTE — Telephone Encounter (Signed)
   CCM RN Visit Note   09/30/22 Name: Christina Galvan MRN: [id number]      DOB: 07-05-1965  Subjective: Christina Galvan is a 57 y.o. year old female who is a primary care patient of Birdie Sons, Yehuda Mao, MD. The patient was referred to the Chronic Care Management team for assistance with care management needs subsequent to provider initiation of CCM services and plan of care.      An unsuccessful outreach attempt was made today for a scheduled CCM visit.    PLAN: A HIPAA compliant phone message was left for the patient providing contact information and requesting a return call.   [facility]/Chronic Care Management [phone]

## 2022-10-22 ENCOUNTER — Encounter (INDEPENDENT_AMBULATORY_CARE_PROVIDER_SITE_OTHER): Payer: Self-pay

## 2022-10-27 ENCOUNTER — Other Ambulatory Visit: Payer: Self-pay | Admitting: Family Medicine

## 2022-10-27 DIAGNOSIS — E876 Hypokalemia: Secondary | ICD-10-CM

## 2022-10-30 ENCOUNTER — Other Ambulatory Visit: Payer: Self-pay | Admitting: Neurology

## 2022-11-10 ENCOUNTER — Ambulatory Visit (INDEPENDENT_AMBULATORY_CARE_PROVIDER_SITE_OTHER): Payer: Medicare Other | Admitting: Family Medicine

## 2022-11-10 ENCOUNTER — Encounter: Payer: Self-pay | Admitting: Family Medicine

## 2022-11-10 VITALS — BP 114/64 | HR 62 | Temp 98.3°F | Ht 63.0 in | Wt 118.6 lb

## 2022-11-10 DIAGNOSIS — F419 Anxiety disorder, unspecified: Secondary | ICD-10-CM | POA: Diagnosis not present

## 2022-11-10 DIAGNOSIS — M546 Pain in thoracic spine: Secondary | ICD-10-CM

## 2022-11-10 DIAGNOSIS — I10 Essential (primary) hypertension: Secondary | ICD-10-CM | POA: Diagnosis not present

## 2022-11-10 DIAGNOSIS — F32A Depression, unspecified: Secondary | ICD-10-CM

## 2022-11-10 DIAGNOSIS — G8929 Other chronic pain: Secondary | ICD-10-CM | POA: Diagnosis not present

## 2022-11-10 LAB — BASIC METABOLIC PANEL
BUN: 15 mg/dL (ref 6–23)
CO2: 28 meq/L (ref 19–32)
Calcium: 9.7 mg/dL (ref 8.4–10.5)
Chloride: 104 meq/L (ref 96–112)
Creatinine, Ser: 0.55 mg/dL (ref 0.40–1.20)
GFR: 101.65 mL/min (ref >60.00)
Glucose, Bld: 81 mg/dL (ref 70–99)
Potassium: 4.2 meq/L (ref 3.5–5.1)
Sodium: 138 meq/L (ref 135–145)

## 2022-11-10 MED ORDER — PREDNISONE 20 MG PO TABS
40.0000 mg | ORAL_TABLET | Freq: Every day | ORAL | 0 refills | Status: AC
Start: 2022-11-10 — End: ?

## 2022-11-10 MED ORDER — NORTRIPTYLINE HCL 10 MG PO CAPS
20.0000 mg | ORAL_CAPSULE | Freq: Every day | ORAL | 5 refills | Status: DC
Start: 2022-11-10 — End: 2023-01-18

## 2022-11-10 NOTE — Telephone Encounter (Signed)
I saw the patient in the office today for follow-up.  She notes she is willing to speak with you.  If you could try calling her again that would be much appreciated.  Thanks.

## 2022-11-10 NOTE — Progress Notes (Signed)
Marikay Alar, MD Phone: [phone]  Christina Galvan is a 57 y.o. female who presents today for follow-up.  Hypertension: Not checking blood pressures.  Taking trandolapril.  No chest pain, shortness of breath, or edema.  Anxiety/depression: Patient notes this is still there.  She is on nortriptyline 10 mg nightly.  Also on buspirone.  No SI.  Chronic back pain: Patient notes this has worsened over the last couple months.  She has pain radiating to her left leg and foot in the anterior portion.  She notes it is better when she is sitting and worsens when she is up moving around.  No numbness.  Does report some tingling and a jerking sensation in the leg as well.  No weakness.  No incontinence.  Social History   Tobacco Use  Smoking Status Every Day   Current packs/day: 0.25   Average packs/day: 0.3 packs/day for 38.1 years (9.5 ttl pk-yrs)   Types: Cigarettes   Start date: 10/21/1984  Smokeless Tobacco Never    Current Outpatient Medications on File Prior to Visit  Medication Sig Dispense Refill   busPIRone (BUSPAR) 10 MG tablet Take 1 tablet (10 mg total) by mouth 2 (two) times daily. 60 tablet 3   donepezil (ARICEPT) 10 MG tablet TAKE ONE TABLET BY MOUTH AT BEDTIME 90 tablet 1   memantine (NAMENDA) 10 MG tablet TAKE ONE TABLET BY MOUTH TWO TIMES DAILY 180 tablet 1   potassium chloride SA (KLOR-CON M) 20 MEQ tablet TAKE ONE TABLET (20 MEQ TOTAL) BY MOUTH DAILY. 30 tablet 1   rosuvastatin (CRESTOR) 20 MG tablet TAKE ONE TABLET BY MOUTH ONCE A DAY 30 tablet 3   SYRINGE-NEEDLE, DISP, 3 ML (B-D 3CC LUER-LOK SYR 25GX1") 25G X 1" 3 ML MISC USE ONCE WEEKLY WITH B12 INJECTION FOR 3 WEEKS, THEN ONCE MONTHLY. 7 each 0   tiZANidine (ZANAFLEX) 2 MG tablet Take 1 tablet (2 mg total) by mouth every 6 (six) hours as needed for muscle spasms. 30 tablet 5   trandolapril (MAVIK) 4 MG tablet TAKE ONE TABLET BY MOUTH DAILY 90 tablet 1   No current facility-administered medications on file prior  to visit.     ROS see history of present illness  Objective  Physical Exam Vitals:   11/10/22 1051  BP: 114/64  Pulse: 62  Temp: 98.3 F (36.8 C)  SpO2: 96%    BP Readings from Last 3 Encounters:  11/10/22 114/64  08/31/22 114/68  08/10/22 122/74   Wt Readings from Last 3 Encounters:  11/10/22 118 lb 9.6 oz (53.8 kg)  08/31/22 115 lb 3.2 oz (52.3 kg)  08/10/22 114 lb 9.6 oz (52 kg)    Physical Exam Constitutional:      General: She is not in acute distress.    Appearance: She is not diaphoretic.  Cardiovascular:     Rate and Rhythm: Normal rate and regular rhythm.     Heart sounds: Normal heart sounds.  Pulmonary:     Effort: Pulmonary effort is normal.     Breath sounds: Normal breath sounds.  Musculoskeletal:       Back:     Comments: No midline spine tenderness, no midline spine step-off  Skin:    General: Skin is warm and dry.  Neurological:     Mental Status: She is alert.     Comments: 5/5 strength bilateral quads, hamstrings, plantarflexion, and dorsiflexion, sensation to light touch intact bilateral lower extremities      Assessment/Plan: Please see individual  problem list.  Essential hypertension Assessment & Plan: Chronic issue.  Adequately controlled.  Continue trandolapril 4 mg daily.  Orders: -     Basic metabolic panel  Anxiety and depression Assessment & Plan: Chronic issue.  Continues to have symptoms.  She will continue buspirone 10 mg twice daily.  We will increase nortriptyline to 20 mg nightly.  Advised to monitor for drowsiness with this.  Orders: -     Nortriptyline HCl; Take 2 capsules (20 mg total) by mouth at bedtime.  Dispense: 60 capsule; Refill: 5  Chronic midline thoracic back pain Assessment & Plan: Chronic issue with recent flare.  Prior MRI from 2020 with degenerative changes.  We will treat with prednisone 40 mg daily for 5 days.  Discussed taking this in the morning.  Discussed risk of sleep issues, appetite  increase, and agitation.  If she has excessive sleep issues or agitation she will discontinue the prednisone and let us know.  I offered home health physical therapy though she declines at this time.  I advised that her or her sister could send [place] a message and we can get this set up at any time.  Orders: -     predniSONE; Take 2 tablets (40 mg total) by mouth daily with breakfast.  Dispense: 10 tablet; Refill: 0   Phone message sent to nurse case manager who tried to contact the patient previously.  Patient is willing to discuss resources with them.  Return in about 2 months (around 01/10/2023) for Anxiety/depression.   Marikay Alar, MD [facility]

## 2022-11-10 NOTE — Assessment & Plan Note (Signed)
Chronic issue with recent flare.  Prior MRI from 2020 with degenerative changes.  We will treat with prednisone 40 mg daily for 5 days.  Discussed taking this in the morning.  Discussed risk of sleep issues, appetite increase, and agitation.  If she has excessive sleep issues or agitation she will discontinue the prednisone and let us know.  I offered home health physical therapy though she declines at this time.  I advised that her or her sister could send [place] a message and we can get this set up at any time.

## 2022-11-10 NOTE — Patient Instructions (Addendum)
Nice to see. Please increase nortriptyline to 20 mg nightly.  Will see if this helps with your depression. The nurse case manager should reach out to you again.  Please try to take their phone call.

## 2022-11-10 NOTE — Assessment & Plan Note (Signed)
Chronic issue.  Continues to have symptoms.  She will continue buspirone 10 mg twice daily.  We will increase nortriptyline to 20 mg nightly.  Advised to monitor for drowsiness with this.

## 2022-11-10 NOTE — Assessment & Plan Note (Signed)
Chronic issue.  Adequately controlled.  Continue trandolapril 4 mg daily.

## 2022-11-11 ENCOUNTER — Encounter: Payer: Self-pay | Admitting: Family Medicine

## 2022-11-13 ENCOUNTER — Other Ambulatory Visit: Payer: Medicare Other

## 2022-11-13 ENCOUNTER — Telehealth: Payer: Self-pay

## 2022-11-13 NOTE — Patient Outreach (Signed)
  Care Management   Outreach Note  11/13/2022 Name: CURRIE BRUNGARD MRN: [id number] DOB: [birth year]  An unsuccessful outreach attempt was made today for a scheduled Care Management visit.    Follow Up Plan:  A HIPAA compliant phone message was left for the patient providing contact information and requesting a return call.     Katina [facility]  [facility], [facility] RN Care Manager Direct Dial: [phone] Website: [email or web address]

## 2022-11-23 ENCOUNTER — Other Ambulatory Visit: Payer: Self-pay | Admitting: Family Medicine

## 2022-11-23 ENCOUNTER — Other Ambulatory Visit: Payer: Medicare Other

## 2022-11-23 DIAGNOSIS — G8929 Other chronic pain: Secondary | ICD-10-CM

## 2022-11-24 NOTE — Telephone Encounter (Signed)
This was a short-term medication.  Please see if she improved with this.  Refill is not appropriate.

## 2022-11-28 ENCOUNTER — Other Ambulatory Visit: Payer: Self-pay | Admitting: Family Medicine

## 2022-11-28 DIAGNOSIS — F419 Anxiety disorder, unspecified: Secondary | ICD-10-CM

## 2022-12-08 ENCOUNTER — Telehealth: Payer: Self-pay

## 2022-12-08 ENCOUNTER — Other Ambulatory Visit: Payer: Medicare Other

## 2022-12-08 NOTE — Patient Outreach (Signed)
  Care Management   Outreach Note  12/08/2022 Name: Christina Galvan MRN: [id number] DOB: 04-23-1965  An unsuccessful telephone outreach was attempted today to contact the patient about Care Management needs.     Follow Up Plan:  A HIPAA compliant phone message was left for the patient providing contact information and requesting a return call.    Katina [facility]  [facility], [facility] RN Care Manager Direct Dial: [phone] Website: [email or web address]

## 2022-12-08 NOTE — Patient Outreach (Signed)
  Care Management   Visit Note  12/08/2022 Name: Christina Galvan MRN: [id number] DOB: 16-Feb-1966  Subjective: Christina Galvan is a 57 y.o. year old female who is a primary care patient of Birdie Sons, Yehuda Mao, MD. The Care Management team was consulted for assistance.      Engaged with patient via telephone.  Assessment:  Outpatient Encounter Medications as of 12/08/2022  Medication Sig Note   busPIRone (BUSPAR) 10 MG tablet TAKE 1 TABLET BY MOUTH TWICE A DAY    memantine (NAMENDA) 10 MG tablet TAKE ONE TABLET BY MOUTH TWO TIMES DAILY    nortriptyline (PAMELOR) 10 MG capsule Take 2 capsules (20 mg total) by mouth at bedtime.    potassium chloride SA (KLOR-CON M) 20 MEQ tablet TAKE ONE TABLET (20 MEQ TOTAL) BY MOUTH DAILY.    predniSONE (DELTASONE) 20 MG tablet Take 2 tablets (40 mg total) by mouth daily with breakfast. 12/08/2022: Caregiver reports this medication is taken as needed.   rosuvastatin (CRESTOR) 20 MG tablet TAKE ONE TABLET BY MOUTH ONCE A DAY    tiZANidine (ZANAFLEX) 2 MG tablet Take 1 tablet (2 mg total) by mouth every 6 (six) hours as needed for muscle spasms.    trandolapril (MAVIK) 4 MG tablet TAKE ONE TABLET BY MOUTH DAILY    donepezil (ARICEPT) 10 MG tablet TAKE ONE TABLET BY MOUTH AT BEDTIME    SYRINGE-NEEDLE, DISP, 3 ML (B-D 3CC LUER-LOK SYR 25GX1") 25G X 1" 3 ML MISC USE ONCE WEEKLY WITH B12 INJECTION FOR 3 WEEKS, THEN ONCE MONTHLY.    No facility-administered encounter medications on file as of 12/08/2022.

## 2022-12-21 ENCOUNTER — Other Ambulatory Visit: Payer: Self-pay | Admitting: Family Medicine

## 2022-12-21 DIAGNOSIS — E876 Hypokalemia: Secondary | ICD-10-CM

## 2022-12-24 ENCOUNTER — Other Ambulatory Visit: Payer: Self-pay | Admitting: Family Medicine

## 2022-12-24 DIAGNOSIS — E785 Hyperlipidemia, unspecified: Secondary | ICD-10-CM

## 2023-01-11 ENCOUNTER — Ambulatory Visit: Payer: Medicare Other | Admitting: Family Medicine

## 2023-01-18 ENCOUNTER — Encounter: Payer: Self-pay | Admitting: Family Medicine

## 2023-01-18 ENCOUNTER — Ambulatory Visit (INDEPENDENT_AMBULATORY_CARE_PROVIDER_SITE_OTHER): Payer: Medicare Other | Admitting: Family Medicine

## 2023-01-18 VITALS — BP 106/64 | HR 79 | Temp 98.0°F | Ht 63.0 in | Wt 123.6 lb

## 2023-01-18 DIAGNOSIS — G8929 Other chronic pain: Secondary | ICD-10-CM

## 2023-01-18 DIAGNOSIS — M546 Pain in thoracic spine: Secondary | ICD-10-CM

## 2023-01-18 DIAGNOSIS — Z23 Encounter for immunization: Secondary | ICD-10-CM

## 2023-01-18 DIAGNOSIS — F419 Anxiety disorder, unspecified: Secondary | ICD-10-CM

## 2023-01-18 DIAGNOSIS — F32A Depression, unspecified: Secondary | ICD-10-CM | POA: Diagnosis not present

## 2023-01-18 MED ORDER — NORTRIPTYLINE HCL 10 MG PO CAPS: 40.0000 mg | ORAL_CAPSULE | Freq: Every day | ORAL | 2 refills | Status: DC

## 2023-01-18 NOTE — Assessment & Plan Note (Addendum)
Chronic issue.  Unchanged.  Will increase nortriptyline to 40 mg nightly.  She will continue buspirone 10 mg twice daily.  Discussed the potential for switching the nortriptyline to something else though she is on this to help with chronic headaches as well.  Counseled to monitor for confusion, drowsiness, dry mouth, and constipation with the nortriptyline.

## 2023-01-18 NOTE — Progress Notes (Signed)
Marikay Alar, MD Phone: [phone]  Christina Galvan is a 57 y.o. female who presents today for follow-up.  Anxiety/depression: Patient reports continued issues with anxiety and depression.  Is no better than her last visit.  She is on BuSpar and nortriptyline.  At her last visit we increased her nortriptyline dose.  She notes this did not make her drowsy.  No SI.  Chronic back pain: Continues to have intermittent thoracic back pain that comes and goes.  It is not necessarily occurring on a weekly basis.  No radiation.  No numbness or weakness.  No incontinence.  Social History   Tobacco Use  Smoking Status Every Day   Current packs/day: 0.25   Average packs/day: 0.3 packs/day for 38.2 years (9.6 ttl pk-yrs)   Types: Cigarettes   Start date: 10/21/1984  Smokeless Tobacco Never    Current Outpatient Medications on File Prior to Visit  Medication Sig Dispense Refill   busPIRone (BUSPAR) 10 MG tablet TAKE 1 TABLET BY MOUTH TWICE A DAY 60 tablet 3   donepezil (ARICEPT) 10 MG tablet TAKE ONE TABLET BY MOUTH AT BEDTIME 90 tablet 1   memantine (NAMENDA) 10 MG tablet TAKE ONE TABLET BY MOUTH TWO TIMES DAILY 180 tablet 1   potassium chloride SA (KLOR-CON M) 20 MEQ tablet TAKE ONE TABLET (20 MEQ TOTAL) BY MOUTH DAILY. 30 tablet 1   predniSONE (DELTASONE) 20 MG tablet Take 2 tablets (40 mg total) by mouth daily with breakfast. 10 tablet 0   rosuvastatin (CRESTOR) 20 MG tablet TAKE ONE TABLET BY MOUTH ONCE A DAY 30 tablet 3   tiZANidine (ZANAFLEX) 2 MG tablet Take 1 tablet (2 mg total) by mouth every 6 (six) hours as needed for muscle spasms. 30 tablet 5   trandolapril (MAVIK) 4 MG tablet TAKE ONE TABLET BY MOUTH DAILY 90 tablet 1   No current facility-administered medications on file prior to visit.     ROS see history of present illness  Objective  Physical Exam Vitals:   01/18/23 1559  BP: 106/64  Pulse: 79  Temp: 98 F (36.7 C)  SpO2: 97%    BP Readings from Last 3  Encounters:  01/18/23 106/64  11/10/22 114/64  08/31/22 114/68   Wt Readings from Last 3 Encounters:  01/18/23 123 lb 9.6 oz (56.1 kg)  11/10/22 118 lb 9.6 oz (53.8 kg)  08/31/22 115 lb 3.2 oz (52.3 kg)    Physical Exam Constitutional:      General: She is not in acute distress.    Appearance: She is not diaphoretic.  Pulmonary:     Effort: Pulmonary effort is normal.     Breath sounds: Normal breath sounds.  Musculoskeletal:     Comments: No midline spine tenderness, no midline spine step-off, there is paraspinous muscular back tenderness in her thoracic spine  Skin:    General: Skin is warm and dry.  Neurological:     Mental Status: She is alert.      Assessment/Plan: Please see individual problem list.  Anxiety and depression Assessment & Plan: Chronic issue.  Unchanged.  Will increase nortriptyline to 40 mg nightly.  She will continue buspirone 10 mg twice daily.  Discussed the potential for switching the nortriptyline to something else though she is on this to help with chronic headaches as well.  Counseled to monitor for confusion, drowsiness, dry mouth, and constipation with the nortriptyline.  Orders: -     Nortriptyline HCl; Take 4 capsules (40 mg total) by mouth  at bedtime.  Dispense: 120 capsule; Refill: 2  Chronic midline thoracic back pain Assessment & Plan: Chronic issue.  Discussed doing home health physical therapy or going to physical therapy in person versus seeing a spine specialist to get their input on treatment options.  Patient opted to see spine specialist.  Referral placed.  Orders: -     Ambulatory referral to Physical Medicine Rehab  Encounter for administration of vaccine -     Flu vaccine trivalent PF, 6mos and older(Flulaval,Afluria,Fluarix,Fluzone)    Return in about 2 months (around 03/20/2023) for Anxiety and depression and Pap smear, 5 months for transfer of care.   Marikay Alar, MD [facility]

## 2023-01-18 NOTE — Patient Instructions (Signed)
Nice to see you. We are increasing your nortriptyline dose to 40 mg daily.  Please monitor for drowsiness, dry mouth, constipation, and confusion with this dose increase.  If any of those occur please let us know. The spine specialist should reach out to you to get you scheduled for a visit.

## 2023-01-18 NOTE — Assessment & Plan Note (Signed)
Chronic issue.  Discussed doing home health physical therapy or going to physical therapy in person versus seeing a spine specialist to get their input on treatment options.  Patient opted to see spine specialist.  Referral placed.

## 2023-01-29 NOTE — Progress Notes (Unsigned)
NEUROLOGY FOLLOW UP OFFICE NOTE  Christina Galvan [id number]  Assessment/Plan:   1.  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 2.  Tension-type headaches 3.  Chronic low back pain with lumbosacral radiculopathy, bilateral 4.  Depression with anxiety. 5.  B12 deficiency    Donepezil 10mg  at bedtime and memantine 10mg  twice daily For headache:  Nortriptyline 10mg  at bedtime *** For depression and anxiety: Nortriptyline 10mg  at bedtime *** Encouraged walks but with someone to accompany her, not alone Encouraged socialization Follow up 6 months. ***      Subjective:  Christina Galvan is a 57 year old right-handed female with HTN, chronic low back pain, migraines, depression, anxiety and history of kidney stones who follows up for early-onset Alzheimer's disease/posterior cortical atrophy   UPDATE: She is living with her mother.  Not driving.  Her sister handles her finances.  Her sister manages her medications.  She is depressed.  She has no appetite.  She is independent in regards to hygiene but doesn't want to shower daily.  ***  Changed from Cymbalta to nortriptyline to address headache, depression, appetite and sciatica.  ***  Current NSAIDS:  none Current analgesics:  Goodys Current triptans:  none Current ergotamine:  none. Current anti-emetic:  Zofran Current muscle relaxants:  none Current anti-anxiolytic:  none Current sleep aide:  none Current Antihypertensive medications:  none Current Antidepressant medications:  Nortriptyline 10mg  at bedtime Current Anticonvulsant medications:  none Current anti-CGRP:  none Current Vitamins/Herbal/Supplements:  none Current Antihistamines/Decongestants:  none Other therapy:  none Hormone/birth control:  none Other medications:  Donepezil 10mg  at bedtime, memantine 10mg  BID   HISTORY: Alzheimer's Dementia with Posterior Cortical Dysfunction:  Since  2020, she has had several medication changes for her anxiety and episodes of confusion.  She was switched from buspirone to sertraline. She was then switched from sertraline to Cymbalta 30mg  in attempt to better treat anxiety and sciatic pain.  She reports worsening symptoms since then.  She describes episodes where she feels like she is in a fog, feeling more angry and sometimes has hallucinations of people walking.  It is typically elicited when she is upset about something.  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  She reports that she sometimes files papers in the wrong chart or will sometimes forget names.  Labs from November 2020 showed TSH 0.63 but also low B12 of 170.  She was started on B12 injections but reports no improvement.  MRI of brain without contrast from 02/07/2019 showed mild chronic small vessel ischemic changes but overall unremarkable.    Around the same time that her symptoms worsened, she reported increased emotional stress.  She had to financially support her son who was in legal trouble, which caused her to lose her home.  She had to move in with her mother.  She underwent neuropsychological testing on 07/13/2019, which demonstrated global and significant impairment which suggests a posterior cortical dysfunction such as early-onset Alzheimer's disease or corticobasal syndrome but did not correlate with Lewy Body Disease.  For further evaluation, she underwent PET scan of brain on 08/22/2019, which demonstrated decreased cortical metabolism in the biparietal lobes and occipital lobes.  She is on long-term disability.   Chronic Tension-type Headaches:  In 2021, she also reported a persistent dull non-throbbing bifrontal headache.  No associated nausea, vomiting, visual disturbance, numbness or weakness.  Previously took [place] powder daily.  Chronic Low Back Pain with Radiculopathy:  MRI of lumbar spine on 08/30/2018 personally reviewed  showed broad-based central disc protrusions at L4-5 causing mild to moderate bilateral lateral recess stenosis potentially affecting either L5 nerve roots, and broad-based central disc protrusion at L5-S1 approximating both S1 nerve roots but no obvious impingement.  Follow up lumbar x-ray on 12/15/2019 personally reviewed was negative.  Back and leg pain aggravated with bilateral leg numbness particularly with prolonged walking and walking up inclines.  Previously took tramadol.   Past NSAIDS:  Ibuprofen, naproxen Past analgesics:  tramadol Past abortive triptans:  none Past abortive ergotamine:  none Past muscle relaxants:  none Past anti-emetic:  none Past antihypertensive medications:  none Past antidepressant medications:  Sertraline 200mg  daily, Cymbalta 60mg  Past anticonvulsant medications: gabapentin Past anti-CGRP:  none Past vitamins/supplements:  B12     Past history:  No history of seizures, head trauma  PAST MEDICAL HISTORY: Past Medical History:  Diagnosis Date   Anxiety    Anxiety and depression    Depression with anxiety    GERD (gastroesophageal reflux disease)    History of frequent urinary tract infections    Hypertension    Kidney stones    Migraines     MEDICATIONS: Current Outpatient Medications on File Prior to Visit  Medication Sig Dispense Refill   busPIRone (BUSPAR) 10 MG tablet TAKE 1 TABLET BY MOUTH TWICE A DAY 60 tablet 3   donepezil (ARICEPT) 10 MG tablet TAKE ONE TABLET BY MOUTH AT BEDTIME 90 tablet 1   memantine (NAMENDA) 10 MG tablet TAKE ONE TABLET BY MOUTH TWO TIMES DAILY 180 tablet 1   nortriptyline (PAMELOR) 10 MG capsule Take 4 capsules (40 mg total) by mouth at bedtime. 120 capsule 2   potassium chloride SA (KLOR-CON M) 20 MEQ tablet TAKE ONE TABLET (20 MEQ TOTAL) BY MOUTH DAILY. 30 tablet 1   predniSONE (DELTASONE) 20 MG tablet Take 2 tablets (40 mg total) by mouth daily with breakfast. 10 tablet 0   rosuvastatin (CRESTOR) 20 MG tablet  TAKE ONE TABLET BY MOUTH ONCE A DAY 30 tablet 3   tiZANidine (ZANAFLEX) 2 MG tablet Take 1 tablet (2 mg total) by mouth every 6 (six) hours as needed for muscle spasms. 30 tablet 5   trandolapril (MAVIK) 4 MG tablet TAKE ONE TABLET BY MOUTH DAILY 90 tablet 1   No current facility-administered medications on file prior to visit.    ALLERGIES: Allergies  Allergen Reactions   Thiazide-Type Diuretics     Hypokalemia, Hyponatremia, Chest pain   Mushroom Extract Complex    Sulfa Antibiotics     FAMILY HISTORY: Family History  Problem Relation Age of Onset   Arthritis Mother    Heart disease Mother    Hypertension Mother    Diabetes Mother    Arrhythmia Mother        A-Fib    Cancer Father        Lung Cancer   Diabetes Brother    Asthma Daughter    Cancer Paternal Uncle        Prostate cancer   Heart disease Maternal Grandfather    Heart disease Paternal Grandfather       Objective:  Blood pressure 98/62, pulse 62, height 5\' 3"  (1.6 m), weight 114 lb (51.7 kg), SpO2 96 %. General: No acute distress.  Patient appears well-groomed.   Head:  Normocephalic/atraumatic Eyes:  Fundi examined but not visualized Heart:  Regular rate and rhythm Neurological Exam:  06/01/2022    3:00 PM 07/21/2021    4:00 PM 07/13/2019    1:00 PM 01/13/2019    1:45 PM  MMSE - Mini Mental State Exam  Orientation to time 1 1 4 4   Orientation to Place 5 5 5 5   Registration 3 3 3 3   Attention/ Calculation 0 1 0 2  Recall 0 1 1 1   Language- name 2 objects 2 2 2 2   Language- repeat 0 0 0 1  Language- follow 3 step command 3 2 2 3   Language- read & follow direction 0 0 1 1  Write a sentence 0 1 1 1   Copy design 0 0 0 0  Total score 14 16 19 23   CN II-XII intact.  Muscle strength 5/5.  Sensation intact to light touch.  Deep tendon reflexes 2+ throughout.  Antalgic gait.  Romberg negative. ***   Christina Millet, DO  CC: Christina Alar, MD

## 2023-02-01 ENCOUNTER — Ambulatory Visit (INDEPENDENT_AMBULATORY_CARE_PROVIDER_SITE_OTHER): Payer: Medicare Other | Admitting: Neurology

## 2023-02-01 ENCOUNTER — Encounter: Payer: Self-pay | Admitting: Neurology

## 2023-02-01 ENCOUNTER — Other Ambulatory Visit: Payer: Self-pay | Admitting: Neurology

## 2023-02-01 VITALS — BP 106/59 | HR 98 | Ht 61.0 in | Wt 124.6 lb

## 2023-02-01 DIAGNOSIS — G300 Alzheimer's disease with early onset: Secondary | ICD-10-CM

## 2023-02-01 DIAGNOSIS — M5441 Lumbago with sciatica, right side: Secondary | ICD-10-CM

## 2023-02-01 DIAGNOSIS — F02B4 Dementia in other diseases classified elsewhere, moderate, with anxiety: Secondary | ICD-10-CM

## 2023-02-01 DIAGNOSIS — M5442 Lumbago with sciatica, left side: Secondary | ICD-10-CM | POA: Diagnosis not present

## 2023-02-01 DIAGNOSIS — G44221 Chronic tension-type headache, intractable: Secondary | ICD-10-CM | POA: Diagnosis not present

## 2023-02-01 DIAGNOSIS — G8929 Other chronic pain: Secondary | ICD-10-CM

## 2023-02-01 MED ORDER — DONEPEZIL HCL 10 MG PO TABS: 10.0000 mg | ORAL_TABLET | Freq: Every day | ORAL | 2 refills | Status: DC

## 2023-02-01 MED ORDER — MEMANTINE HCL 10 MG PO TABS: 10.0000 mg | ORAL_TABLET | Freq: Two times a day (BID) | ORAL | 2 refills | Status: DC

## 2023-02-01 NOTE — Patient Instructions (Signed)
Continue donepezil 10mg  daily and memantine 10mg  twice daily If no improvement in headaches in 2 months, contact me and we can increase nortriptyline Limit use of pain relievers to no more than 2 days out of week to prevent risk of rebound or medication-overuse headache.

## 2023-02-12 ENCOUNTER — Other Ambulatory Visit: Payer: Self-pay | Admitting: Family Medicine

## 2023-02-12 DIAGNOSIS — E876 Hypokalemia: Secondary | ICD-10-CM

## 2023-02-16 ENCOUNTER — Telehealth: Payer: Self-pay | Admitting: Family Medicine

## 2023-02-16 NOTE — Telephone Encounter (Signed)
Copied from CRM [id number]. Topic: Medicare AWV >> Feb 16, 2023  9:39 AM Payton Doughty wrote: Reason for CRM: Called LVM 02/16/2023 to schedule Annual Wellness Visit(AWVI)  Verlee Rossetti; Care Guide Ambulatory Clinical Support [facility] Direct Dial: [phone]

## 2023-03-08 ENCOUNTER — Other Ambulatory Visit: Payer: Self-pay | Admitting: Neurology

## 2023-03-19 ENCOUNTER — Other Ambulatory Visit: Payer: Self-pay | Admitting: Family Medicine

## 2023-03-19 DIAGNOSIS — F419 Anxiety disorder, unspecified: Secondary | ICD-10-CM

## 2023-03-22 ENCOUNTER — Encounter: Payer: Self-pay | Admitting: Family Medicine

## 2023-03-22 ENCOUNTER — Ambulatory Visit (INDEPENDENT_AMBULATORY_CARE_PROVIDER_SITE_OTHER): Payer: Medicare Other | Admitting: Family Medicine

## 2023-03-22 VITALS — BP 124/76 | HR 85 | Temp 98.5°F | Ht 65.0 in | Wt 127.0 lb

## 2023-03-22 DIAGNOSIS — F32A Depression, unspecified: Secondary | ICD-10-CM

## 2023-03-22 DIAGNOSIS — F419 Anxiety disorder, unspecified: Secondary | ICD-10-CM

## 2023-03-22 MED ORDER — MIRTAZAPINE 7.5 MG PO TABS: 7.5000 mg | ORAL_TABLET | Freq: Every day | ORAL | 1 refills | Status: DC

## 2023-03-22 NOTE — Patient Instructions (Addendum)
 Nice to see you. We can have you taper down on the nortriptyline .  Please reduce your nortriptyline  dose to 20 mg nightly for 2 weeks and then you will reduce to 10 mg nightly for 2 weeks and then you will discontinue use.  If you have any agitation, increased anxiety, chills, sweatiness, headache, trouble sleeping, irritability, or nausea with making this dose reduction please let us  know. We will start you on mirtazapine  7.5 mg nightly once you come off the nortriptyline 

## 2023-03-22 NOTE — Progress Notes (Signed)
  Christina Her, MD Phone: [phone]  Christina Galvan is a 58 y.o. female who presents today for follow-up.  Anxiety/depression: Patient notes this is not any better.  Has been taking nortriptyline  40 mg nightly.  Also on buspirone .  No SI.  She notes the nortriptyline  does not help with Galvan headaches.  Patient is due for Pap smear.  She declines having this done.  Social History   Tobacco Use  Smoking Status Every Day   Current packs/day: 0.25   Average packs/day: 0.3 packs/day for 38.4 years (9.6 ttl pk-yrs)   Types: Cigarettes   Start date: 10/21/1984  Smokeless Tobacco Never    Current Outpatient Medications on File Prior to Visit  Medication Sig Dispense Refill   busPIRone  (BUSPAR ) 10 MG tablet TAKE ONE TABLET BY MOUTH TWICE A DAY 60 tablet 3   donepezil  (ARICEPT ) 10 MG tablet TAKE ONE TABLET BY MOUTH AT BEDTIME 90 tablet 1   memantine  (NAMENDA ) 10 MG tablet Take 1 tablet (10 mg total) by mouth 2 (two) times daily. 180 tablet 2   potassium chloride  SA (KLOR-CON  M) 20 MEQ tablet TAKE ONE TABLET (20 MEQ TOTAL) BY MOUTH DAILY. 30 tablet 1   predniSONE  (DELTASONE ) 20 MG tablet Take 2 tablets (40 mg total) by mouth daily with breakfast. 10 tablet 0   rosuvastatin  (CRESTOR ) 20 MG tablet TAKE ONE TABLET BY MOUTH ONCE A DAY 30 tablet 3   tiZANidine  (ZANAFLEX ) 2 MG tablet Take 1 tablet (2 mg total) by mouth every 6 (six) hours as needed for muscle spasms. 30 tablet 5   trandolapril  (MAVIK ) 4 MG tablet TAKE ONE TABLET BY MOUTH DAILY 90 tablet 1   No current facility-administered medications on file prior to visit.     ROS see history of present illness  Objective  Physical Exam Vitals:   03/22/23 1444  BP: 124/76  Pulse: 85  Temp: 98.5 F (36.9 C)  SpO2: 98%    BP Readings from Last 3 Encounters:  03/22/23 124/76  02/01/23 (!) 106/59  01/18/23 106/64   Wt Readings from Last 3 Encounters:  03/22/23 127 lb (57.6 kg)  02/01/23 124 lb 9.6 oz (56.5 kg)  01/18/23  123 lb 9.6 oz (56.1 kg)    Physical Exam Constitutional:      General: She is not in acute distress. Neurological:     Mental Status: She is alert.      Assessment/Plan: Please see individual problem list.  Anxiety and depression Assessment & Plan: Chronic issue.  Continues to be uncontrolled.  We will taper the patient off of nortriptyline  by having Galvan take 20 mg nightly for 2 weeks and then reducing to 10 mg nightly for 2 weeks and then discontinuing.  After discontinuing the nortriptyline  she was started on mirtazapine  7.5 mg nightly.  She will continue buspirone  10 mg twice daily.  She is advised to monitor for any discontinuation symptoms with coming off of the nortriptyline .  She will follow-up in about 6 weeks.   Other orders -     Mirtazapine ; Take 1 tablet (7.5 mg total) by mouth at bedtime.  Dispense: 30 tablet; Refill: 1    Return in about 6 weeks (around 05/03/2023) for Anxiety/depression.   Christina Her, MD [facility]

## 2023-03-22 NOTE — Assessment & Plan Note (Signed)
 Chronic issue.  Continues to be uncontrolled.  We will taper the patient off of nortriptyline  by having her take 20 mg nightly for 2 weeks and then reducing to 10 mg nightly for 2 weeks and then discontinuing.  After discontinuing the nortriptyline  she was started on mirtazapine  7.5 mg nightly.  She will continue buspirone  10 mg twice daily.  She is advised to monitor for any discontinuation symptoms with coming off of the nortriptyline .  She will follow-up in about 6 weeks.

## 2023-04-01 IMAGING — CT CT RENAL STONE PROTOCOL
2 of 4 series · 16 of 46 positions shown, 18 images · non-contrast
Comparison: 01/31/2013

CLINICAL DATA: Flank pain, dysuria for 2 days

EXAM:
CT ABDOMEN AND PELVIS WITHOUT CONTRAST
TECHNIQUE: Multidetector CT imaging of the abdomen and pelvis was performed
following the standard protocol without IV contrast.

[Series 2: stone full standard · axial · 0.88mm/px · z∈[-1076,-666]mm · 13 of 90 slices shown, 15 images]
[im 4/90  soft-tissue]
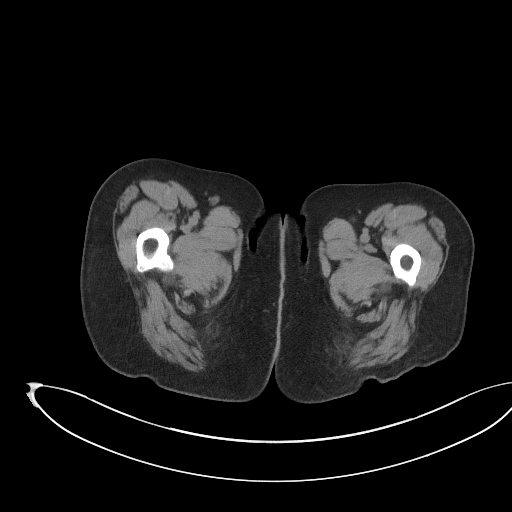
[im 4/90  bone]
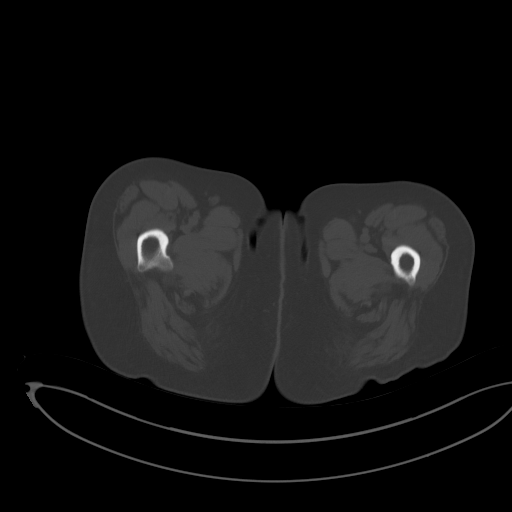
[im 12/90  soft-tissue]
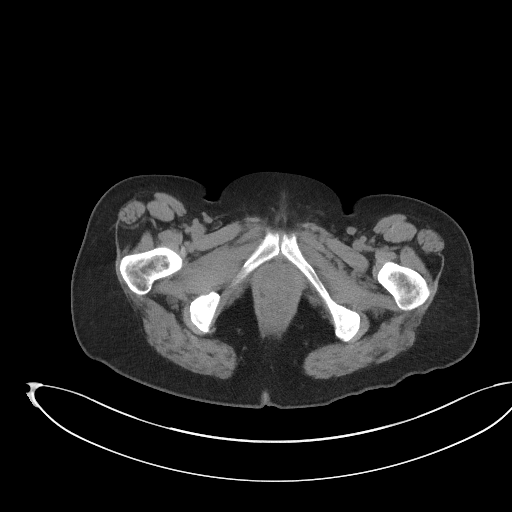
[im 19/90  soft-tissue]
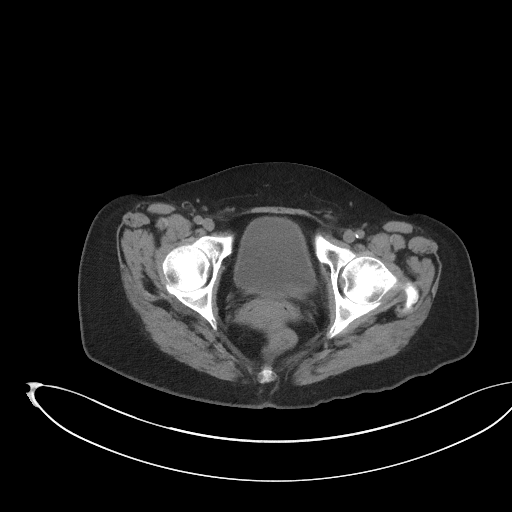
[im 26/90  soft-tissue]
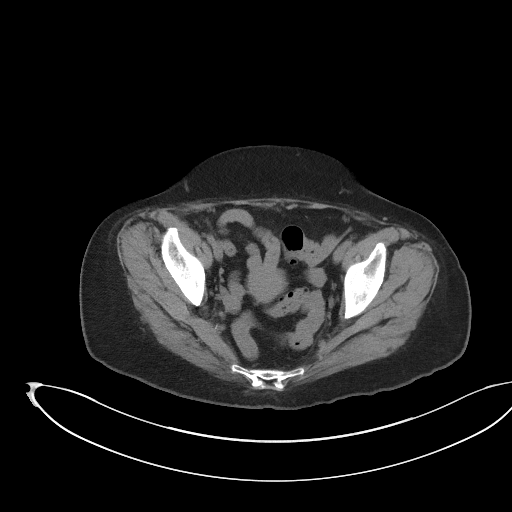
[im 30/90  soft-tissue]
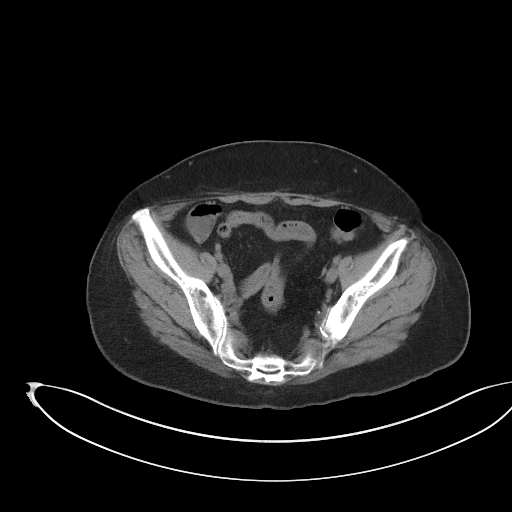
[im 38/90  soft-tissue]
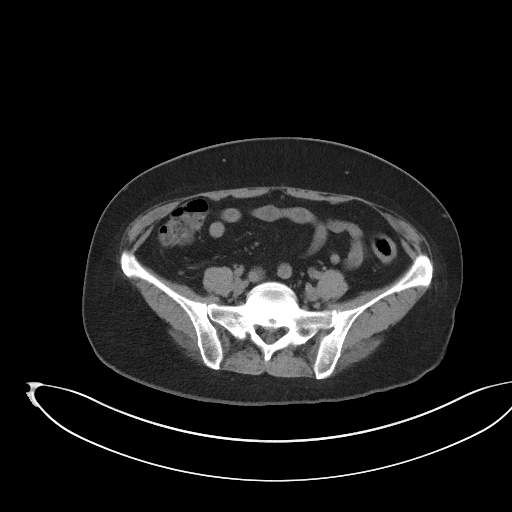
[im 45/90  soft-tissue]
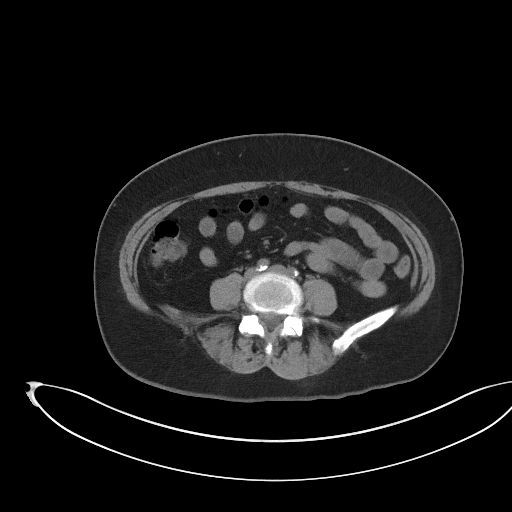
[im 52/90  soft-tissue]
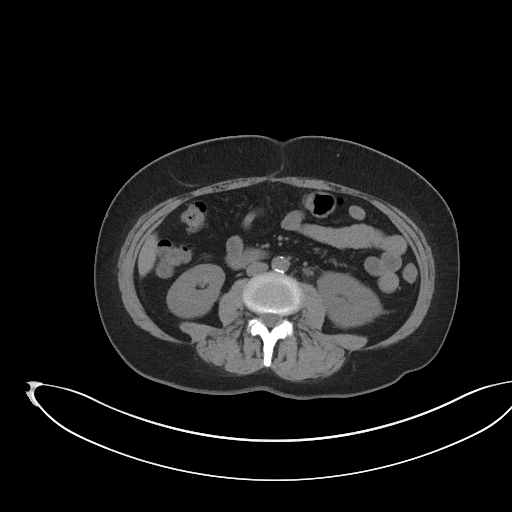
[im 60/90  soft-tissue]
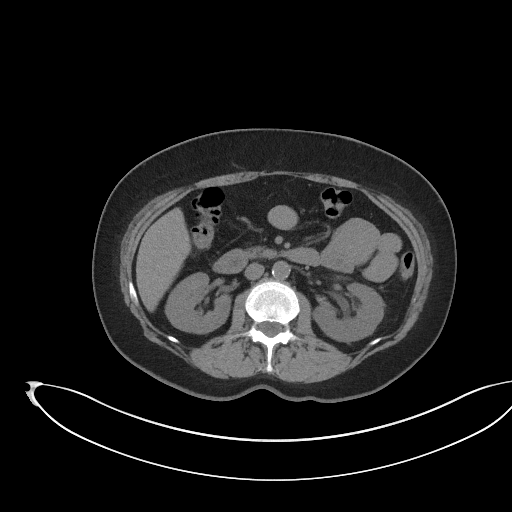
[im 60/90  bone]
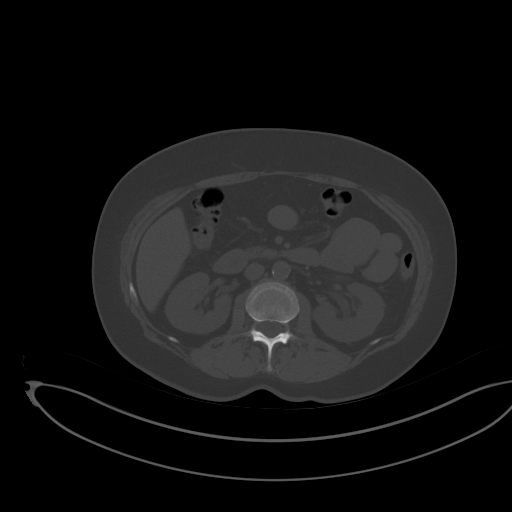
[im 64/90  soft-tissue]
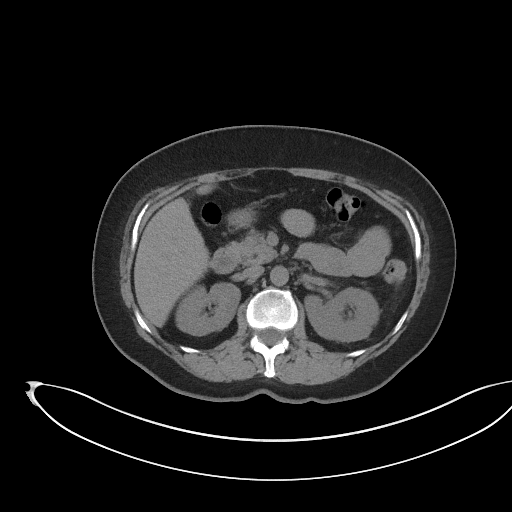
[im 71/90  soft-tissue]
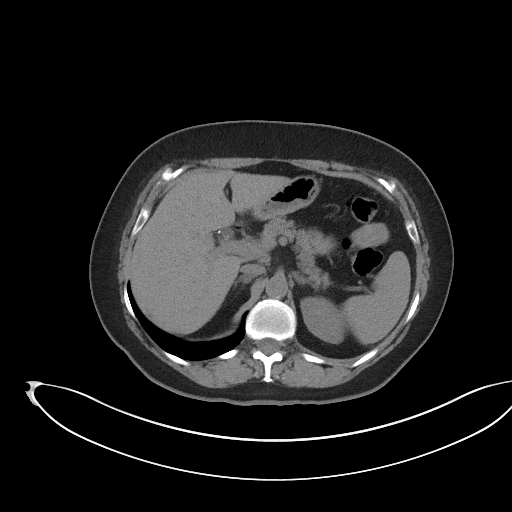
[im 78/90  soft-tissue]
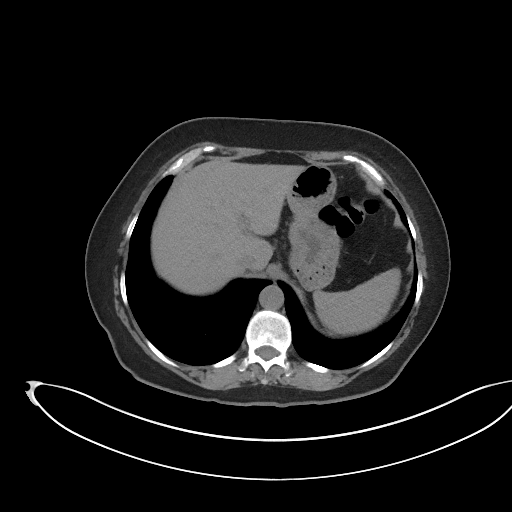
[im 86/90  soft-tissue]
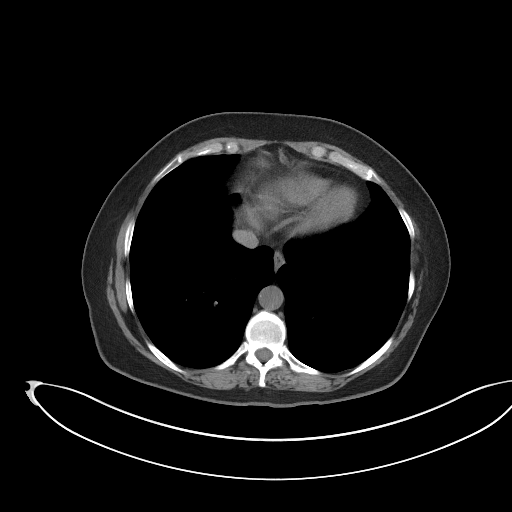

[Series 5: coronal · coronal · 0.71mm/px · 3 of 119 slices shown]
[im 40/119  soft-tissue]
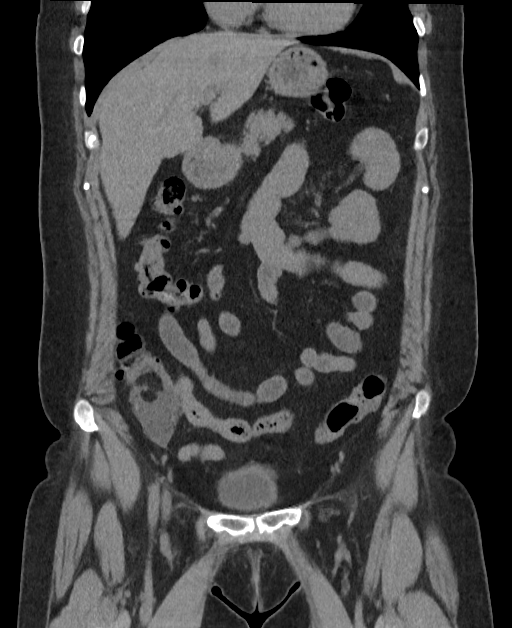
[im 53/119  soft-tissue]
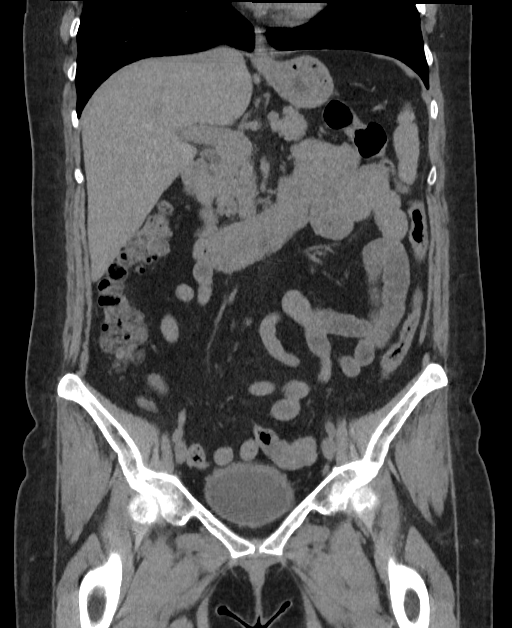
[im 66/119  soft-tissue]
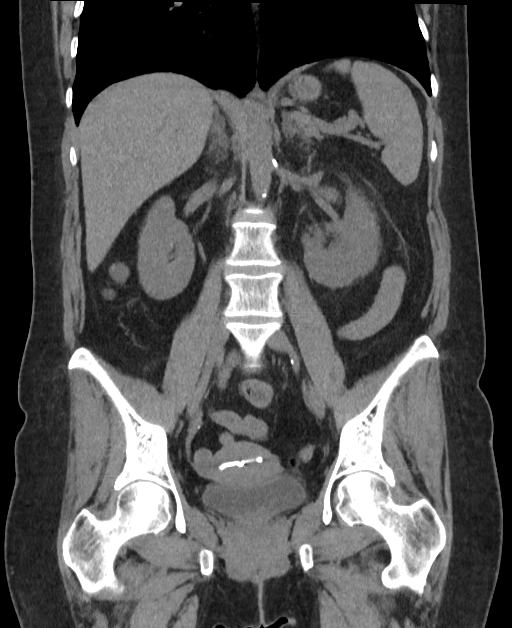

[16 of 46 positions shown; findings below may reference images not displayed]

FINDINGS: Lower chest: No acute abnormality.

Hepatobiliary: No focal liver abnormality is seen. Status post
cholecystectomy. No biliary dilatation.

Pancreas: Unremarkable. No pancreatic ductal dilatation or
surrounding inflammatory changes.

Spleen: Normal in size without focal abnormality.

Adrenals/Urinary Tract: Adrenal glands are unremarkable. No
urolithiasis. Mild left hydroureteronephrosis without an obstructing
lesion which may reflect a recently passed calculus. No right
obstructive uropathy.

Stomach/Bowel: Stomach is within normal limits. Appendix appears
normal. No evidence of bowel wall thickening, distention, or
inflammatory changes.

Vascular/Lymphatic: Normal caliber abdominal aorta with mild
atherosclerosis. No lymphadenopathy.

Reproductive: Uterus and bilateral adnexa are unremarkable. T-type
IUD noted.

Other: No abdominal wall hernia or abnormality. No abdominopelvic
ascites.

Musculoskeletal: No acute osseous abnormality. No aggressive osseous
lesion.
IMPRESSION: 1. Mild left hydroureteronephrosis without an obstructing lesion
which may reflect a recently passed calculus. No right obstructive
uropathy.
2.  Aortic Atherosclerosis (35V00-MAY.Y).

## 2023-04-07 ENCOUNTER — Other Ambulatory Visit: Payer: Self-pay | Admitting: Family Medicine

## 2023-04-07 DIAGNOSIS — E876 Hypokalemia: Secondary | ICD-10-CM

## 2023-05-03 ENCOUNTER — Ambulatory Visit: Payer: Medicare Other | Admitting: Family Medicine

## 2023-05-03 DIAGNOSIS — I10 Essential (primary) hypertension: Secondary | ICD-10-CM

## 2023-05-03 DIAGNOSIS — F419 Anxiety disorder, unspecified: Secondary | ICD-10-CM

## 2023-05-03 DIAGNOSIS — E785 Hyperlipidemia, unspecified: Secondary | ICD-10-CM

## 2023-05-12 ENCOUNTER — Other Ambulatory Visit: Payer: Self-pay

## 2023-05-12 DIAGNOSIS — E785 Hyperlipidemia, unspecified: Secondary | ICD-10-CM

## 2023-05-12 MED ORDER — ROSUVASTATIN CALCIUM 20 MG PO TABS: 20.0000 mg | ORAL_TABLET | Freq: Every day | ORAL | 3 refills | Status: DC

## 2023-05-26 ENCOUNTER — Telehealth: Payer: Self-pay | Admitting: Family Medicine

## 2023-05-26 NOTE — Telephone Encounter (Signed)
 Dr Clent Ridges is leaving the practice and your New Patient or Transfer of Care appointment needs to be rescheduled with another provider. Please call the office to schedule a Transfer of Care to either Dr Charlann Lange, Darleen Crocker or Kara Dies, NP.  [phone], please reschedule this patient's TOC visit. -KH   Thank you

## 2023-06-22 ENCOUNTER — Encounter: Payer: Medicare Other | Admitting: Family Medicine

## 2023-06-23 ENCOUNTER — Other Ambulatory Visit: Payer: Self-pay

## 2023-06-23 MED ORDER — MIRTAZAPINE 7.5 MG PO TABS: 7.5000 mg | ORAL_TABLET | Freq: Every day | ORAL | 0 refills | Status: DC

## 2023-07-21 ENCOUNTER — Other Ambulatory Visit: Payer: Self-pay | Admitting: Internal Medicine

## 2023-07-27 ENCOUNTER — Other Ambulatory Visit: Payer: Self-pay | Admitting: Nurse Practitioner

## 2023-07-27 DIAGNOSIS — E876 Hypokalemia: Secondary | ICD-10-CM

## 2023-07-27 NOTE — Telephone Encounter (Signed)
 POTASSIUM CHLORIDE  ER ER TABLET ER                                Last lab draw 09/28/2022  Last refill: 03/11/2023     Rx #: [id number]

## 2023-08-06 ENCOUNTER — Telehealth: Payer: Self-pay

## 2023-08-06 ENCOUNTER — Ambulatory Visit: Admitting: Nurse Practitioner

## 2023-08-06 ENCOUNTER — Encounter: Payer: Self-pay | Admitting: Nurse Practitioner

## 2023-08-06 VITALS — BP 126/82 | HR 68 | Temp 98.3°F | Ht 65.0 in | Wt 141.4 lb

## 2023-08-06 DIAGNOSIS — Z720 Tobacco use: Secondary | ICD-10-CM | POA: Diagnosis not present

## 2023-08-06 DIAGNOSIS — R0789 Other chest pain: Secondary | ICD-10-CM

## 2023-08-06 DIAGNOSIS — F419 Anxiety disorder, unspecified: Secondary | ICD-10-CM

## 2023-08-06 DIAGNOSIS — G300 Alzheimer's disease with early onset: Secondary | ICD-10-CM

## 2023-08-06 DIAGNOSIS — Z124 Encounter for screening for malignant neoplasm of cervix: Secondary | ICD-10-CM

## 2023-08-06 DIAGNOSIS — F32A Depression, unspecified: Secondary | ICD-10-CM

## 2023-08-06 DIAGNOSIS — I10 Essential (primary) hypertension: Secondary | ICD-10-CM

## 2023-08-06 DIAGNOSIS — H539 Unspecified visual disturbance: Secondary | ICD-10-CM

## 2023-08-06 DIAGNOSIS — F0284 Dementia in other diseases classified elsewhere, unspecified severity, with anxiety: Secondary | ICD-10-CM

## 2023-08-06 DIAGNOSIS — E782 Mixed hyperlipidemia: Secondary | ICD-10-CM

## 2023-08-06 MED ORDER — BUSPIRONE HCL 15 MG PO TABS: 15.0000 mg | ORAL_TABLET | Freq: Two times a day (BID) | ORAL | 2 refills | Status: DC

## 2023-08-06 MED ORDER — BUSPIRONE HCL 10 MG PO TABS: 10.0000 mg | ORAL_TABLET | Freq: Two times a day (BID) | ORAL | 3 refills | Status: DC

## 2023-08-06 NOTE — Progress Notes (Signed)
 Established Patient Office Visit  Subjective:  Patient ID: Christina Galvan, female    DOB: 06-28-1965  Age: 58 y.o. MRN: [id number]  CC:  Chief Complaint  Patient presents with   Establish Care    Transfer of Care from DR. Sonnenberg    HPI  AVO SCHLACHTER presents for transfer of care from Dr. Maribeth.  She is accompanied by her sister.    Anxiety/ depression: She experiences anxiety with sensations of feeling 'high' and 'clammy', associated with chest pain exacerbated by stress. She takes Buspar  10 mg twice daily without relief. She has history of smoking and smokes less than half pack a day.  Vision problem: She experiences significant vision problems, particularly in dimly lit areas, and has difficulty with near vision. These issues have persisted for about a year and a half. She has not had a recent eye examination due to lack of insurance.  Major neurocognitive disorder : Secondary to Early onset Alzheimer's. Followed by neurology. On Donepezil  10 mg and memantine  10 mg BID.    Her caregiver notes difficulty with tasks involving numbers and inability to cook due to vision and cognitive issues. She primarily eats frozen dinners and has lost interest in foods she used to enjoy. Her caregiver assists with medications and finances.    HPI   Past Medical History:  Diagnosis Date   Anxiety    Anxiety and depression    Depression with anxiety    GERD (gastroesophageal reflux disease)    History of frequent urinary tract infections    Hypertension    Kidney stones    Migraines     Past Surgical History:  Procedure Laterality Date   carpal tunnel     CESAREAN SECTION  1993   CHOLECYSTECTOMY  1994   COLONOSCOPY WITH PROPOFOL  N/A 02/12/2017   Procedure: COLONOSCOPY WITH PROPOFOL ;  Surgeon: Therisa Bi, MD;  Location: [facility] ENDOSCOPY;  Service: Gastroenterology;  Laterality: N/A;   COLONOSCOPY WITH PROPOFOL  N/A 07/23/2022   Procedure: COLONOSCOPY WITH PROPOFOL ;   Surgeon: Therisa Bi, MD;  Location: [facility] ENDOSCOPY;  Service: Gastroenterology;  Laterality: N/A;    Family History  Problem Relation Age of Onset   Arthritis Mother    Heart disease Mother    Hypertension Mother    Diabetes Mother    Arrhythmia Mother        A-Fib    Cancer Father        Lung Cancer   Diabetes Brother    Asthma Daughter    Cancer Paternal Uncle        Prostate cancer   Heart disease Maternal Grandfather    Heart disease Paternal Grandfather     Social History   Socioeconomic History   Marital status: Single    Spouse name: Not on file   Number of children: Not on file   Years of education: Not on file   Highest education level: GED or equivalent  Occupational History   Not on file  Tobacco Use   Smoking status: Every Day    Current packs/day: 0.25    Average packs/day: 0.3 packs/day for 38.9 years (9.7 ttl pk-yrs)    Types: Cigarettes    Start date: 10/21/1984   Smokeless tobacco: Never  Vaping Use   Vaping status: Never Used  Substance and Sexual Activity   Alcohol use: Not Currently    Alcohol/week: 0.0 standard drinks of alcohol    Comment: Rare   Drug use: No   Sexual activity:  Not Currently    Partners: Male    Birth control/protection: I.U.D.  Other Topics Concern   Not on file  Social History Narrative   Works at [employer]    Lives with son and daughter   Pets: None   Caffeine- 1 20 oz bottle of soda       Right handed   One story    Social Drivers of Health   Financial Resource Strain: Low Risk  (03/18/2023)   Overall Financial Resource Strain (CARDIA)    Difficulty of Paying Living Expenses: Not hard at all  Food Insecurity: No Food Insecurity (03/18/2023)   Hunger Vital Sign    Worried About Running Out of Food in the Last Year: Never true    Ran Out of Food in the Last Year: Never true  Recent Concern: Food Insecurity - Food Insecurity Present (01/14/2023)   Hunger Vital Sign    Worried About Running Out of Food in the  Last Year: Sometimes true    Ran Out of Food in the Last Year: Sometimes true  Transportation Needs: No Transportation Needs (03/18/2023)   PRAPARE - Administrator, Civil Service (Medical): No    Lack of Transportation (Non-Medical): No  Physical Activity: Insufficiently Active (03/18/2023)   Exercise Vital Sign    Days of Exercise per Week: 5 days    Minutes of Exercise per Session: 10 min  Stress: No Stress Concern Present (03/18/2023)   [employer] of Occupational Health - Occupational Stress Questionnaire    Feeling of Stress : Not at all  Social Connections: Socially Isolated (03/18/2023)   Social Connection and Isolation Panel    Frequency of Communication with Friends and Family: Twice a week    Frequency of Social Gatherings with Friends and Family: More than three times a week    Attends Religious Services: Never    Database administrator or Organizations: No    Attends Engineer, structural: Not on file    Marital Status: Never married  Intimate Partner Violence: Not on file     Outpatient Medications Prior to Visit  Medication Sig Dispense Refill   donepezil  (ARICEPT ) 10 MG tablet TAKE ONE TABLET BY MOUTH AT BEDTIME 90 tablet 1   memantine  (NAMENDA ) 10 MG tablet Take 1 tablet (10 mg total) by mouth 2 (two) times daily. 180 tablet 2   rosuvastatin  (CRESTOR ) 20 MG tablet Take 1 tablet (20 mg total) by mouth daily. 30 tablet 3   tiZANidine  (ZANAFLEX ) 2 MG tablet Take 1 tablet (2 mg total) by mouth every 6 (six) hours as needed for muscle spasms. 30 tablet 5   busPIRone  (BUSPAR ) 10 MG tablet TAKE ONE TABLET BY MOUTH TWICE A DAY 60 tablet 3   mirtazapine  (REMERON ) 7.5 MG tablet TAKE ONE TABLET (7.5 MG TOTAL) BY MOUTH AT BEDTIME. 30 tablet 0   potassium chloride  SA (KLOR-CON  M) 20 MEQ tablet TAKE ONE TABLET (20 MEQ TOTAL) BY MOUTH DAILY. 30 tablet 1   trandolapril  (MAVIK ) 4 MG tablet TAKE ONE TABLET BY MOUTH DAILY 90 tablet 1   predniSONE  (DELTASONE ) 20 MG  tablet Take 2 tablets (40 mg total) by mouth daily with breakfast. (Patient not taking: Reported on 08/06/2023) 10 tablet 0   No facility-administered medications prior to visit.    Allergies  Allergen Reactions   Thiazide-Type Diuretics     Hypokalemia, Hyponatremia, Chest pain   Mushroom Extract Complex (Obsolete)    Sulfa Antibiotics  ROS Review of Systems Negative unless indicated in HPI.    Objective:     Physical Exam  BP 126/82   Pulse 68   Temp 98.3 F (36.8 C)   Ht 5' 5 (1.651 m)   Wt 141 lb 6.4 oz (64.1 kg)   SpO2 99%   BMI 23.53 kg/m  Wt Readings from Last 3 Encounters:  08/06/23 141 lb 6.4 oz (64.1 kg)  03/22/23 127 lb (57.6 kg)  02/01/23 124 lb 9.6 oz (56.5 kg)     Health Maintenance  Topic Date Due   Medicare Annual Wellness (AWV)  Never done   Hepatitis C Screening  Never done   Pneumococcal Vaccine 18-47 Years old (1 of 2 - PCV) Never done   Hepatitis B Vaccines (1 of 3 - 19+ 3-dose series) Never done   Cervical Cancer Screening (HPV/Pap Cotest)  Never done   MAMMOGRAM  Never done   Zoster Vaccines- Shingrix (1 of 2) Never done   INFLUENZA VACCINE  10/08/2023   DTaP/Tdap/Td (5 - Td or Tdap) 08/25/2026   Colonoscopy  07/22/2029   HPV VACCINES  Aged Out   Meningococcal B Vaccine  Aged Out   COVID-19 Vaccine  Discontinued       Topic Date Due   Hepatitis B Vaccines (1 of 3 - 19+ 3-dose series) Never done    Lab Results  Component Value Date   TSH 0.82 06/22/2022   Lab Results  Component Value Date   WBC 6.7 06/22/2022   HGB 13.7 06/22/2022   HCT 39.4 06/22/2022   MCV 95.9 06/22/2022   PLT 213.0 06/22/2022   Lab Results  Component Value Date   NA 138 11/10/2022   K 4.2 11/10/2022   CO2 28 11/10/2022   GLUCOSE 81 11/10/2022   BUN 15 11/10/2022   CREATININE 0.55 11/10/2022   BILITOT 0.4 06/22/2022   ALKPHOS 83 06/22/2022   AST 15 06/22/2022   ALT 8 06/22/2022   PROT 6.4 06/22/2022   ALBUMIN 4.1 06/22/2022   CALCIUM   9.7 11/10/2022   ANIONGAP 8 10/23/2020   GFR 101.65 11/10/2022   Lab Results  Component Value Date   CHOL 90 06/22/2022   Lab Results  Component Value Date   HDL 43.50 06/22/2022   Lab Results  Component Value Date   LDLCALC 31 06/22/2022   Lab Results  Component Value Date   TRIG 78.0 06/22/2022   Lab Results  Component Value Date   CHOLHDL 2 06/22/2022   Lab Results  Component Value Date   HGBA1C 5.6 01/13/2019      Assessment & Plan:  Anxiety and depression Assessment & Plan: GAD score 21. Will increase BuSpar  from 10 mg to 15 mg twice a day. Will continue to monitor   Cervical cancer screening  Intermittent left-sided chest pain Assessment & Plan: Intermittent left-sided chest pain.  Chest pain reproducible on physical exam. -EKG sinus rhythm with right bundle branch block. -Will refer to cardiology for further evaluation and management    Orders: -     EKG 12-Lead -     Ambulatory referral to Cardiology  Tobacco abuse Assessment & Plan: Smoking cessation was discussed, 5-7 minutes was spent of this topic specifically.      Mixed hyperlipidemia Assessment & Plan: Chronic issue.   Continue Crestor  20 mg daily.    Essential hypertension Assessment & Plan: Patient BP  Vitals:   08/06/23 1316  BP: 126/82   Advised pt to follow a low  sodium and heart healthy diet. Continue trandolapril  4 mg daily.   Early onset Alzheimer's dementia with anxiety, unspecified dementia severity (HCC) Assessment & Plan: Followed by neurology. -Continue Namenda  10 mg and Aricept  10 mg twice a day.     Abnormal vision of both eyes Assessment & Plan: Vision impairment affecting daily activities for 1.5 years. No recent eye examination due to lack of insurance. -Pt unable to perform standard visual acuity testing using Snellen scale chart.   -Will refer to ophthalmology.   Orders: -     Ambulatory referral to Ophthalmology  Other orders -      busPIRone  HCl; Take 1 tablet (15 mg total) by mouth 2 (two) times daily.  Dispense: 60 tablet; Refill: 2    Follow-up: No follow-ups on file.   Hasel Janish, NP

## 2023-08-06 NOTE — Telephone Encounter (Signed)
 Copied from CRM [id number]. Topic: Clinical - Prescription Issue >> Aug 06, 2023  3:42 PM Deaijah H wrote: Reason for CRM: [facility]  called regarding prescriptions received busPIRone  (BUSPAR ) 15 MG tablet and also received busPIRone  (BUSPAR ) 10 MG tablet would like to know which one is correct advised busPIRone  (BUSPAR ) 15 MG tablet. [phone] opt 1 ask for [facility]

## 2023-08-06 NOTE — Telephone Encounter (Signed)
 [facility] called and asked was Patient supposed to be on Buspar  10 mg and 15 mg. The chart shows Buspar  15 mg was called in today for Patient to take 15 mg 2 times a day dispense 60 with 2 refills so this is what I told the pharmacy to fill.

## 2023-08-08 NOTE — Telephone Encounter (Signed)
 Noted

## 2023-08-09 NOTE — Telephone Encounter (Signed)
 This message was handled on 08/06/23 please see phone note.

## 2023-08-12 DIAGNOSIS — H539 Unspecified visual disturbance: Secondary | ICD-10-CM | POA: Insufficient documentation

## 2023-08-12 NOTE — Assessment & Plan Note (Signed)
 Vision impairment affecting daily activities for 1.5 years. No recent eye examination due to lack of insurance. -Pt unable to perform standard visual acuity testing using Snellen scale chart.   -Will refer to ophthalmology.

## 2023-08-17 ENCOUNTER — Other Ambulatory Visit: Payer: Self-pay | Admitting: Internal Medicine

## 2023-08-26 ENCOUNTER — Telehealth: Payer: Self-pay | Admitting: Nurse Practitioner

## 2023-08-26 ENCOUNTER — Other Ambulatory Visit: Payer: Self-pay

## 2023-08-26 DIAGNOSIS — E876 Hypokalemia: Secondary | ICD-10-CM

## 2023-08-26 MED ORDER — POTASSIUM CHLORIDE CRYS ER 20 MEQ PO TBCR: 20.0000 meq | EXTENDED_RELEASE_TABLET | Freq: Every day | ORAL | 1 refills | Status: DC

## 2023-08-26 NOTE — Telephone Encounter (Unsigned)
 Copied from CRM [id number]. Topic: Clinical - Medication Refill >> Aug 26, 2023  2:18 PM [place] M wrote: Medication: potassium chloride  SA (KLOR-CON  M) 20 MEQ tablet  Has the patient contacted their pharmacy? Yes (Agent: If no, request that the patient contact the pharmacy for the refill. If patient does not wish to contact the pharmacy document the reason why and proceed with request.) (Agent: If yes, when and what did the pharmacy advise?)  This is the patient's preferred pharmacy:  [facility] - [place], [place] - [address] [place] [address] Phone: [phone] Fax: [phone]  Is this the correct pharmacy for this prescription? Yes If no, delete pharmacy and type the correct one.   Has the prescription been filled recently? Yes  Is the patient out of the medication? Yes  Has the patient been seen for an appointment in the last year OR does the patient have an upcoming appointment? Yes  Can we respond through MyChart? Yes  Agent: Please be advised that Rx refills may take up to 3 business days. We ask that you follow-up with your pharmacy.

## 2023-09-06 ENCOUNTER — Other Ambulatory Visit: Payer: Self-pay

## 2023-09-06 DIAGNOSIS — R0789 Other chest pain: Secondary | ICD-10-CM | POA: Insufficient documentation

## 2023-09-06 MED ORDER — TRANDOLAPRIL 4 MG PO TABS: 4.0000 mg | ORAL_TABLET | Freq: Every day | ORAL | 1 refills | Status: DC

## 2023-09-06 NOTE — Assessment & Plan Note (Signed)
 GAD score 21. Will increase BuSpar  from 10 mg to 15 mg twice a day. Will continue to monitor

## 2023-09-06 NOTE — Assessment & Plan Note (Signed)
Chronic issue.  Continue Crestor 20 mg daily.

## 2023-09-06 NOTE — Assessment & Plan Note (Signed)
Smoking cessation was discussed, 5-7 minutes was spent of this topic specifically.   

## 2023-09-06 NOTE — Assessment & Plan Note (Addendum)
 Followed by neurology. -Continue Namenda  10 mg and Aricept  10 mg twice a day.

## 2023-09-06 NOTE — Assessment & Plan Note (Addendum)
 Intermittent left-sided chest pain.  Chest pain reproducible on physical exam. -EKG sinus rhythm with right bundle branch block. -Will refer to cardiology for further evaluation and management

## 2023-09-06 NOTE — Assessment & Plan Note (Signed)
 Patient BP  Vitals:   08/06/23 1316  BP: 126/82   Advised pt to follow a low sodium and heart healthy diet. Continue trandolapril  4 mg daily.

## 2023-09-14 ENCOUNTER — Other Ambulatory Visit: Payer: Self-pay | Admitting: Internal Medicine

## 2023-09-14 DIAGNOSIS — E785 Hyperlipidemia, unspecified: Secondary | ICD-10-CM

## 2023-09-24 ENCOUNTER — Other Ambulatory Visit: Payer: Self-pay | Admitting: Nurse Practitioner

## 2023-09-24 ENCOUNTER — Ambulatory Visit

## 2023-09-27 ENCOUNTER — Other Ambulatory Visit: Payer: Self-pay | Admitting: Nurse Practitioner

## 2023-10-04 ENCOUNTER — Other Ambulatory Visit: Payer: Self-pay | Admitting: Nurse Practitioner

## 2023-10-04 DIAGNOSIS — E876 Hypokalemia: Secondary | ICD-10-CM

## 2023-10-11 ENCOUNTER — Other Ambulatory Visit: Payer: Self-pay | Admitting: Internal Medicine

## 2023-10-11 DIAGNOSIS — E785 Hyperlipidemia, unspecified: Secondary | ICD-10-CM

## 2023-10-15 ENCOUNTER — Other Ambulatory Visit: Payer: Self-pay | Admitting: Neurology

## 2023-10-18 ENCOUNTER — Other Ambulatory Visit: Payer: Self-pay | Admitting: Nurse Practitioner

## 2023-10-26 ENCOUNTER — Other Ambulatory Visit: Payer: Self-pay | Admitting: Nurse Practitioner

## 2023-11-11 ENCOUNTER — Other Ambulatory Visit: Payer: Self-pay | Admitting: Nurse Practitioner

## 2023-11-11 DIAGNOSIS — E785 Hyperlipidemia, unspecified: Secondary | ICD-10-CM

## 2023-11-18 ENCOUNTER — Encounter: Payer: Self-pay | Admitting: Cardiology

## 2023-11-18 ENCOUNTER — Ambulatory Visit: Attending: Cardiology | Admitting: Cardiology

## 2023-11-18 VITALS — BP 112/62 | HR 64 | Ht 65.5 in | Wt 149.0 lb

## 2023-11-18 DIAGNOSIS — E7800 Pure hypercholesterolemia, unspecified: Secondary | ICD-10-CM | POA: Diagnosis not present

## 2023-11-18 DIAGNOSIS — R072 Precordial pain: Secondary | ICD-10-CM | POA: Diagnosis not present

## 2023-11-18 DIAGNOSIS — I10 Essential (primary) hypertension: Secondary | ICD-10-CM | POA: Insufficient documentation

## 2023-11-18 DIAGNOSIS — F17200 Nicotine dependence, unspecified, uncomplicated: Secondary | ICD-10-CM | POA: Insufficient documentation

## 2023-11-18 MED ORDER — METOPROLOL TARTRATE 50 MG PO TABS: ORAL_TABLET | ORAL | 0 refills | Status: AC

## 2023-11-18 NOTE — Progress Notes (Signed)
 Cardiology Office Note:    Date:  11/18/2023   ID:  Christina Galvan, DOB 1965/11/01, MRN [id number]  PCP:  Vincente Saber, NP    HeartCare Providers Cardiologist:  None     Referring MD: Vincente Saber, NP   Chief Complaint  Patient presents with   Establish Care    New pt has been doing well with no complaints of chest pain,some chest pressure about 3 weeks ago or SOB, medciation reviewed verbally with patient    History of Present Illness:    Christina Galvan is a 58 y.o. female with a hx of hypertension, hyperlipidemia, current smoker x 40 years, anxiety, dementia presenting with chest pain.  Sister helps with history due to condition of patient.  Endorses chest pain on and off over the past 3 to 6 months.  Pain is located on the left side of her chest radiating down her left shoulder.  Symptoms are not associated with exertion.  Mother had a stent placed in her 21s.  Denies dizziness, presyncope or syncope.  Past Medical History:  Diagnosis Date   Anxiety    Anxiety and depression    Depression with anxiety    GERD (gastroesophageal reflux disease)    History of frequent urinary tract infections    Hypertension    Kidney stones    Migraines     Past Surgical History:  Procedure Laterality Date   carpal tunnel     CESAREAN SECTION  1993   CHOLECYSTECTOMY  1994   COLONOSCOPY WITH PROPOFOL  N/A 02/12/2017   Procedure: COLONOSCOPY WITH PROPOFOL ;  Surgeon: Therisa Bi, MD;  Location: [facility] ENDOSCOPY;  Service: Gastroenterology;  Laterality: N/A;   COLONOSCOPY WITH PROPOFOL  N/A 07/23/2022   Procedure: COLONOSCOPY WITH PROPOFOL ;  Surgeon: Therisa Bi, MD;  Location: [facility] ENDOSCOPY;  Service: Gastroenterology;  Laterality: N/A;    Current Medications: Current Meds  Medication Sig   busPIRone  (BUSPAR ) 15 MG tablet TAKE ONE TABLET (15 MG TOTAL) BY MOUTH TWO (TWO) TIMES DAILY.   donepezil  (ARICEPT ) 10 MG tablet TAKE ONE TABLET BY MOUTH AT BEDTIME    memantine  (NAMENDA ) 10 MG tablet Take 1 tablet (10 mg total) by mouth 2 (two) times daily.   metoprolol  tartrate (LOPRESSOR ) 50 MG tablet TAKE 1 TABLET 2 HR PRIOR TO CARDIAC PROCEDURE   mirtazapine  (REMERON ) 7.5 MG tablet TAKE ONE TABLET (7.5 MG TOTAL) BY MOUTH AT BEDTIME.   potassium chloride  SA (KLOR-CON  M) 20 MEQ tablet TAKE ONE TABLET (20 MEQ TOTAL) BY MOUTH DAILY.   predniSONE  (DELTASONE ) 20 MG tablet Take 2 tablets (40 mg total) by mouth daily with breakfast.   rosuvastatin  (CRESTOR ) 20 MG tablet TAKE ONE TABLET (20 MG TOTAL) BY MOUTH DAILY.   tiZANidine  (ZANAFLEX ) 2 MG tablet Take 1 tablet (2 mg total) by mouth every 6 (six) hours as needed for muscle spasms.   trandolapril  (MAVIK ) 4 MG tablet Take 1 tablet (4 mg total) by mouth daily.     Allergies:   Thiazide-type diuretics, Mushroom extract complex (obsolete), and Sulfa antibiotics   Social History   Socioeconomic History   Marital status: Single    Spouse name: Not on file   Number of children: Not on file   Years of education: Not on file   Highest education level: Associate degree: occupational, Scientist, product/process development, or vocational program  Occupational History   Not on file  Tobacco Use   Smoking status: Every Day    Current packs/day: 0.25    Average packs/day: 0.3  packs/day for 39.1 years (9.8 ttl pk-yrs)    Types: Cigarettes    Start date: 10/21/1984   Smokeless tobacco: Never  Vaping Use   Vaping status: Never Used  Substance and Sexual Activity   Alcohol use: Not Currently    Alcohol/week: 0.0 standard drinks of alcohol    Comment: Rare   Drug use: No   Sexual activity: Not Currently    Partners: Male    Birth control/protection: I.U.D.  Other Topics Concern   Not on file  Social History Narrative   Works at [employer]    Lives with son and daughter   Pets: None   Caffeine- 1 20 oz bottle of soda       Right handed   One story    Social Drivers of Health   Financial Resource Strain: Low Risk  (09/20/2023)    Overall Financial Resource Strain (CARDIA)    Difficulty of Paying Living Expenses: Not hard at all  Food Insecurity: No Food Insecurity (09/20/2023)   Hunger Vital Sign    Worried About Running Out of Food in the Last Year: Never true    Ran Out of Food in the Last Year: Never true  Transportation Needs: No Transportation Needs (09/20/2023)   PRAPARE - Administrator, Civil Service (Medical): No    Lack of Transportation (Non-Medical): No  Physical Activity: Inactive (09/20/2023)   Exercise Vital Sign    Days of Exercise per Week: 0 days    Minutes of Exercise per Session: Not on file  Stress: No Stress Concern Present (09/20/2023)   [employer] of Occupational Health - Occupational Stress Questionnaire    Feeling of Stress: Only a little  Social Connections: Socially Isolated (09/20/2023)   Social Connection and Isolation Panel    Frequency of Communication with Friends and Family: Three times a week    Frequency of Social Gatherings with Friends and Family: More than three times a week    Attends Religious Services: Never    Database administrator or Organizations: No    Attends Engineer, structural: Not on file    Marital Status: Never married     Family History: The patient's family history includes Arrhythmia in her mother; Arthritis in her mother; Asthma in her daughter; Cancer in her father and paternal uncle; Diabetes in her brother and mother; Heart disease in her maternal grandfather, mother, and paternal grandfather; Hypertension in her mother.  ROS:   Please see the history of present illness.     All other systems reviewed and are negative.  EKGs/Labs/Other Studies Reviewed:    The following studies were reviewed today:  EKG Interpretation Date/Time:  Thursday November 18 2023 11:11:27 EDT Ventricular Rate:  64 PR Interval:  160 QRS Duration:  134 QT Interval:  434 QTC Calculation: 447 R Axis:   -37  Text Interpretation: Normal sinus  rhythm Left axis deviation Right bundle branch block Inferior infarct , age undetermined Confirmed by Darliss Rogue ([id number]) on 11/18/2023 11:38:56 AM    Recent Labs: No results found for requested labs within last 365 days.  Recent Lipid Panel    Component Value Date/Time   CHOL 90 06/22/2022 1602   TRIG 78.0 06/22/2022 1602   HDL 43.50 06/22/2022 1602   CHOLHDL 2 06/22/2022 1602   VLDL 15.6 06/22/2022 1602   LDLCALC 31 06/22/2022 1602   LDLDIRECT 40.0 03/30/2018 0807     Risk Assessment/Calculations:  Physical Exam:    VS:  BP 112/62 (BP Location: Left Arm, Patient Position: Sitting, Cuff Size: Normal)   Pulse 64   Ht 5' 5.5 (1.664 m)   Wt 149 lb (67.6 kg)   SpO2 99%   BMI 24.42 kg/m     Wt Readings from Last 3 Encounters:  11/18/23 149 lb (67.6 kg)  08/06/23 141 lb 6.4 oz (64.1 kg)  03/22/23 127 lb (57.6 kg)     GEN:  Well nourished, well developed in no acute distress HEENT: Normal NECK: No JVD; No carotid bruits CARDIAC: RRR, no murmurs, rubs, gallops RESPIRATORY:  Clear to auscultation without rales, wheezing or rhonchi  ABDOMEN: Soft, non-tender, non-distended MUSCULOSKELETAL:  No edema; No deformity  SKIN: Warm and dry NEUROLOGIC:  Alert and oriented x 3 PSYCHIATRIC:  Normal affect   ASSESSMENT:    1. Precordial pain   2. Essential hypertension   3. Pure hypercholesterolemia   4. Current smoker    PLAN:    In order of problems listed above:  Chest pain, several risk factors.  Obtain echo, obtain coronary CTA to evaluate CAD. Hypertension, BP controlled.  Continue trandolapril  4 mg daily Hyperlipidemia, cholesterol controlled.  Continue Crestor  20 mg daily. Current smoker, smoking cessation recommended.  Follow-up after cardiac testing.      Medication Adjustments/Labs and Tests Ordered: Current medicines are reviewed at length with the patient today.  Concerns regarding medicines are outlined above.  Orders Placed This  Encounter  Procedures   CT CORONARY MORPH W/CTA COR W/SCORE W/CA W/CM &/OR WO/CM   Basic metabolic panel with GFR   EKG 87-Ozji   ECHOCARDIOGRAM COMPLETE   Meds ordered this encounter  Medications   metoprolol  tartrate (LOPRESSOR ) 50 MG tablet    Sig: TAKE 1 TABLET 2 HR PRIOR TO CARDIAC PROCEDURE    Dispense:  1 tablet    Refill:  0    Patient Instructions  Medication Instructions:  - Take one 50 mg metoprolol  two hours prior to cardiac CT  *If you need a refill on your cardiac medications before your next appointment, please call your pharmacy*  Lab Work: Your provider would like for you to have following labs drawn today BMP.   If you have labs (blood work) drawn today and your tests are completely normal, you will receive your results only by: MyChart Message (if you have MyChart) OR A paper copy in the mail If you have any lab test that is abnormal or we need to change your treatment, we will call you to review the results.  Testing/Procedures: Your physician has requested that you have an echocardiogram. Echocardiography is a painless test that uses sound waves to create images of your heart. It provides your doctor with information about the size and shape of your heart and how well your heart's chambers and valves are working.   You may receive an ultrasound enhancing agent through an IV if needed to better visualize your heart during the echo. This procedure takes approximately one hour.  There are no restrictions for this procedure.  This will take place at [address] [facility]) [address], [place] [address]  Please note: We ask at that you not bring children with you during ultrasound (echo/ vascular) testing. Due to room size and safety concerns, children are not allowed in the ultrasound rooms during exams. Our front office staff cannot provide observation of children in our lobby area while testing is being conducted. An adult accompanying a patient  to  their appointment will only be allowed in the ultrasound room at the discretion of the ultrasound technician under special circumstances. We apologize for any inconvenience.   Your cardiac CT will be scheduled at:  [facility] [address] [place], [place] [address] [phone]  Please arrive 15 mins early for check-in and test prep.  There is spacious parking and easy access to the radiology department from the [facility] Heart and Vascular entrance. Please enter here and check-in with the desk attendant.    Please follow these instructions carefully (unless otherwise directed):  An IV will be required for this test and Nitroglycerin  will be given.  Hold all erectile dysfunction medications at least 3 days (72 hrs) prior to test. (Ie viagra, cialis, sildenafil, tadalafil, etc)     On the Night Before the Test: Be sure to Drink plenty of water . Do not consume any caffeinated/decaffeinated beverages or chocolate 12 hours prior to your test. Do not take any antihistamines 12 hours prior to your test.  On the Day of the Test: Drink plenty of water  until 1 hour prior to the test. Do not eat any food 1 hour prior to test. You may take your regular medications prior to the test.  Take metoprolol  (Lopressor ) two hours prior to test. If you take Furosemide/Hydrochlorothiazide/Spironolactone/Chlorthalidone , please HOLD on the morning of the test. Patients who wear a continuous glucose monitor MUST remove the device prior to scanning. FEMALES- please wear underwire-free bra if available, avoid dresses & tight clothing       After the Test: Drink plenty of water . After receiving IV contrast, you may experience a mild flushed feeling. This is normal. On occasion, you may experience a mild rash up to 24 hours after the test. This is not dangerous. If this occurs, you can take Benadryl 25 mg, Zyrtec, Claritin, or Allegra and increase your fluid intake. (Patients taking  Tikosyn should avoid Benadryl, and may take Zyrtec, Claritin, or Allegra) If you experience trouble breathing, this can be serious. If it is severe call 911 IMMEDIATELY. If it is mild, please call our office.  We will call to schedule your test 2-4 weeks out understanding that some insurance companies will need an authorization prior to the service being performed.   For more information and frequently asked questions, please visit our website : [email or web address]  For non-scheduling related questions, please contact the cardiac imaging nurse navigator should you have any questions/concerns: Cardiac Imaging Nurse Navigators Direct Office Dial: [phone]   For scheduling needs, including cancellations and rescheduling, please call [place], [phone].    Follow-Up: At [facility], you and your health needs are our priority.  As part of our continuing mission to provide you with exceptional heart care, our providers are all part of one team.  This team includes your primary Cardiologist (physician) and Advanced Practice Providers or APPs (Physician Assistants and Nurse Practitioners) who all work together to provide you with the care you need, when you need it.  Your next appointment:   3 month(s)  Provider:   You may see Dr. Darliss or one of the following Advanced Practice Providers on your designated Care Team:   Lonni Meager, NP Lesley Maffucci, PA-C Bernardino Bring, PA-C Cadence Whiteriver, PA-C Tylene Lunch, NP Barnie Hila, NP    We recommend signing up for the patient portal called MyChart.  Sign up information is provided on this After Visit Summary.  MyChart is used to connect with patients for Virtual  Visits (Telemedicine).  Patients are able to view lab/test results, encounter notes, upcoming appointments, etc.  Non-urgent messages can be sent to your provider as well.   To learn more about what you can do with MyChart, go to  [email or web address].         Signed, Redell Cave, MD  11/18/2023 1:19 PM    [facility]

## 2023-11-18 NOTE — Patient Instructions (Signed)
 Medication Instructions:  - Take one 50 mg metoprolol  two hours prior to cardiac CT  *If you need a refill on your cardiac medications before your next appointment, please call your pharmacy*  Lab Work: Your provider would like for you to have following labs drawn today BMP.   If you have labs (blood work) drawn today and your tests are completely normal, you will receive your results only by: MyChart Message (if you have MyChart) OR A paper copy in the mail If you have any lab test that is abnormal or we need to change your treatment, we will call you to review the results.  Testing/Procedures: Your physician has requested that you have an echocardiogram. Echocardiography is a painless test that uses sound waves to create images of your heart. It provides your doctor with information about the size and shape of your heart and how well your heart's chambers and valves are working.   You may receive an ultrasound enhancing agent through an IV if needed to better visualize your heart during the echo. This procedure takes approximately one hour.  There are no restrictions for this procedure.  This will take place at [address] [facility]) [address], [place] [address]  Please note: We ask at that you not bring children with you during ultrasound (echo/ vascular) testing. Due to room size and safety concerns, children are not allowed in the ultrasound rooms during exams. Our front office staff cannot provide observation of children in our lobby area while testing is being conducted. An adult accompanying a patient to their appointment will only be allowed in the ultrasound room at the discretion of the ultrasound technician under special circumstances. We apologize for any inconvenience.   Your cardiac CT will be scheduled at:  [facility] [address] [place], [place] [address] [phone]  Please arrive 15 mins early for check-in and test  prep.  There is spacious parking and easy access to the radiology department from the [facility] Heart and Vascular entrance. Please enter here and check-in with the desk attendant.    Please follow these instructions carefully (unless otherwise directed):  An IV will be required for this test and Nitroglycerin  will be given.  Hold all erectile dysfunction medications at least 3 days (72 hrs) prior to test. (Ie viagra, cialis, sildenafil, tadalafil, etc)     On the Night Before the Test: Be sure to Drink plenty of water . Do not consume any caffeinated/decaffeinated beverages or chocolate 12 hours prior to your test. Do not take any antihistamines 12 hours prior to your test.  On the Day of the Test: Drink plenty of water  until 1 hour prior to the test. Do not eat any food 1 hour prior to test. You may take your regular medications prior to the test.  Take metoprolol  (Lopressor ) two hours prior to test. If you take Furosemide/Hydrochlorothiazide/Spironolactone/Chlorthalidone , please HOLD on the morning of the test. Patients who wear a continuous glucose monitor MUST remove the device prior to scanning. FEMALES- please wear underwire-free bra if available, avoid dresses & tight clothing       After the Test: Drink plenty of water . After receiving IV contrast, you may experience a mild flushed feeling. This is normal. On occasion, you may experience a mild rash up to 24 hours after the test. This is not dangerous. If this occurs, you can take Benadryl 25 mg, Zyrtec, Claritin, or Allegra and increase your fluid intake. (Patients taking Tikosyn should avoid  Benadryl, and may take Zyrtec, Claritin, or Allegra) If you experience trouble breathing, this can be serious. If it is severe call 911 IMMEDIATELY. If it is mild, please call our office.  We will call to schedule your test 2-4 weeks out understanding that some insurance companies will need an authorization prior to the service being  performed.   For more information and frequently asked questions, please visit our website : [email or web address]  For non-scheduling related questions, please contact the cardiac imaging nurse navigator should you have any questions/concerns: Cardiac Imaging Nurse Navigators Direct Office Dial: [phone]   For scheduling needs, including cancellations and rescheduling, please call [place], [phone].    Follow-Up: At [facility], you and your health needs are our priority.  As part of our continuing mission to provide you with exceptional heart care, our providers are all part of one team.  This team includes your primary Cardiologist (physician) and Advanced Practice Providers or APPs (Physician Assistants and Nurse Practitioners) who all work together to provide you with the care you need, when you need it.  Your next appointment:   3 month(s)  Provider:   You may see Dr. Darliss or one of the following Advanced Practice Providers on your designated Care Team:   Lonni Meager, NP Lesley Maffucci, PA-C Bernardino Bring, PA-C Cadence [place], PA-C Tylene Lunch, NP Barnie Hila, NP    We recommend signing up for the patient portal called MyChart.  Sign up information is provided on this After Visit Summary.  MyChart is used to connect with patients for Virtual Visits (Telemedicine).  Patients are able to view lab/test results, encounter notes, upcoming appointments, etc.  Non-urgent messages can be sent to your provider as well.   To learn more about what you can do with MyChart, go to [email or web address].

## 2023-11-19 LAB — BASIC METABOLIC PANEL WITH GFR
BUN/Creatinine Ratio: 26 — ABNORMAL HIGH (ref 9–23)
BUN: 14 mg/dL (ref 6–24)
CO2: 23 mmol/L (ref 20–29)
Calcium: 9.5 mg/dL (ref 8.7–10.2)
Chloride: 106 mmol/L (ref 96–106)
Creatinine, Ser: 0.54 mg/dL — ABNORMAL LOW (ref 0.57–1.00)
Glucose: 74 mg/dL (ref 70–99)
Potassium: 4.2 mmol/L (ref 3.5–5.2)
Sodium: 143 mmol/L (ref 134–144)
eGFR: 107 mL/min/1.73 (ref >59)

## 2023-11-22 ENCOUNTER — Other Ambulatory Visit: Payer: Self-pay | Admitting: Neurology

## 2023-11-25 ENCOUNTER — Other Ambulatory Visit: Payer: Self-pay

## 2023-11-25 ENCOUNTER — Encounter: Payer: Self-pay | Admitting: Neurology

## 2023-12-09 ENCOUNTER — Other Ambulatory Visit: Payer: Self-pay | Admitting: Nurse Practitioner

## 2023-12-09 DIAGNOSIS — E876 Hypokalemia: Secondary | ICD-10-CM

## 2023-12-09 DIAGNOSIS — E785 Hyperlipidemia, unspecified: Secondary | ICD-10-CM

## 2023-12-12 NOTE — Telephone Encounter (Signed)
 refill

## 2023-12-12 NOTE — Telephone Encounter (Signed)
 Medication has been refilled. Please call pt to schedule follow up.

## 2023-12-20 ENCOUNTER — Other Ambulatory Visit: Payer: Self-pay | Admitting: Neurology

## 2023-12-23 ENCOUNTER — Encounter (HOSPITAL_COMMUNITY): Payer: Self-pay

## 2023-12-27 NOTE — Progress Notes (Unsigned)
 NEUROLOGY FOLLOW UP OFFICE NOTE  Christina DULSKI [id number]  Assessment/Plan:   1.  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 2.  Tension-type headaches, likely component of medication-overuse. 3.  Depression with anxiety.  As she is already on mirtazapine  and her mood and poor appetite is more pressing than her headaches at this time, will address that first.  Memory care:   Donepezil  10mg  at bedtime and memantine  10mg  twice daily Headache:   Limit use of pain relievers to no more than 9 days out of the month to prevent risk of rebound or medication-overuse headache. Mood and poor appetite: Increase mirtazapine  to 15mg  at bedtime  Follow up in 9 months.      Subjective:  Christina Galvan is a 58 year old right-handed female with HTN, chronic low back pain, migraines, depression, anxiety and history of kidney stones who follows up for early-onset Alzheimer's disease/posterior cortical atrophy.  She is accompanied by her sister who supplements history.   UPDATE: Alzheimer's: She is living with her mother.  Not driving.  Her sister handles her finances.  Her sister manages her medications.  She is depressed.  She has no appetite. She eats mostly frozen dinners which she prepares herself in the microwave.  Does not use the stove.  She is independent in regards to hygiene and dressing self.  She will walk the dog and rake leaves in the yard.  She does not know how to operate the TV remote but she can use her phone, she typically sits and watching Netflix and playing games on the phone.  Sleep is poor.   She says it takes a while to fall asleep.  She has no set sleep schedule.  She goes to bed once it is dark and she feels tired.  She wakes up early, but not a specific time. Her sister says she goes to bed around 9 or 10 PM and likely sleeps 8 or 9 hours.  Vision is getting worse.  She may not see something in front  of her.  Only alone with her mom for a short time during the day.  Otherwise, her brother or sister are present.  No concern about using the stove/oven unattended or wandering outside the house.  She cannot elaborate.  She needs bright lights in the room.  Has not had an eye exam which is being ordered by her PCP.    Headache: Nortriptyline  was switched to mirtazapine  by her PCP because she said it wasn't helping.  She doesn't know how often she has a headache.  Takes Goody powder but does not know how frequently she takes it.  Her sister says she suspects that she takes it 3 to 5 days a week.  No improvement in mood or sleep pattern.  Mood is getting worse.    Chronic back pain with radiculopathy: At this point, stable.  Current medications: Current NSAIDS:  none Current analgesics:  Goodys Current anti-emetic:  Zofran  Current muscle relaxants:  tizanidine  2mg  PRN Current Antihypertensive medications:  trandolapril  Current Antidepressant medications:  mirtazapine  7.5mg  QHS Other medications:  Donepezil  10mg  at bedtime, memantine  10mg  BID   HISTORY: Alzheimer's Dementia with Posterior Cortical Dysfunction:  Since 2020, she has had several medication changes for her anxiety and episodes of confusion.  She was switched from buspirone  to sertraline . She was then switched from sertraline  to Cymbalta  30mg  in attempt to better treat anxiety and sciatic pain.  She reports worsening symptoms  since then.  She describes episodes where she feels like she is in a fog, feeling more angry and sometimes has hallucinations of people walking.  It is typically elicited when she is upset about something.  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  She reports that she sometimes files papers in the wrong chart or will sometimes forget names.  Labs from November 2020 showed TSH 0.63 but also low B12 of 170.  She was started on B12 injections but reports no  improvement.  MRI of brain without contrast from 02/07/2019 showed mild chronic small vessel ischemic changes but overall unremarkable.    Around the same time that her symptoms worsened, she reported increased emotional stress.  She had to financially support her son who was in legal trouble, which caused her to lose her home.  She had to move in with her mother.  She underwent neuropsychological testing on 07/13/2019, which demonstrated global and significant impairment which suggests a posterior cortical dysfunction such as early-onset Alzheimer's disease or corticobasal syndrome but did not correlate with Lewy Body Disease.  For further evaluation, she underwent PET scan of brain on 08/22/2019, which demonstrated decreased cortical metabolism in the biparietal lobes and occipital lobes.  She is on long-term disability.   Chronic Tension-type Headaches:  In 2021, she also reported a persistent dull non-throbbing bifrontal headache.  No associated nausea, vomiting, visual disturbance, numbness or weakness.  Previously took [place] powder daily.   Chronic Low Back Pain with Radiculopathy:  MRI of lumbar spine on 08/30/2018 personally reviewed showed broad-based central disc protrusions at L4-5 causing mild to moderate bilateral lateral recess stenosis potentially affecting either L5 nerve roots, and broad-based central disc protrusion at L5-S1 approximating both S1 nerve roots but no obvious impingement.  Follow up lumbar x-ray on 12/15/2019 personally reviewed was negative.  Back and leg pain aggravated with bilateral leg numbness particularly with prolonged walking and walking up inclines.  Previously took tramadol .   Past medications: Past NSAIDS:  Ibuprofen , naproxen Past analgesics:  tramadol  Past abortive triptans:  none Past abortive ergotamine:  none Past muscle relaxants:  none Past anti-emetic:  none Past antihypertensive medications:  none Past antidepressant medications:  Nortriptyline  40mg ,  sertraline  200mg  daily, Cymbalta  60mg  Past anticonvulsant medications: gabapentin  Past anti-CGRP:  none Past vitamins/supplements:  B12     Past history:  No history of seizures, head trauma  PAST MEDICAL HISTORY: Past Medical History:  Diagnosis Date   Anxiety    Anxiety and depression    Depression with anxiety    GERD (gastroesophageal reflux disease)    History of frequent urinary tract infections    Hypertension    Kidney stones    Migraines     MEDICATIONS: Current Outpatient Medications on File Prior to Visit  Medication Sig Dispense Refill   busPIRone  (BUSPAR ) 15 MG tablet TAKE ONE TABLET (15 MG TOTAL) BY MOUTH TWO (TWO) TIMES DAILY. 60 tablet 2   donepezil  (ARICEPT ) 10 MG tablet TAKE ONE TABLET BY MOUTH AT BEDTIME 90 tablet 1   memantine  (NAMENDA ) 10 MG tablet TAKE ONE TABLET (10 MG TOTAL) BY MOUTH TWO (TWO) TIMES DAILY. (EMERGENCY 30 DAY SUPPLY) 60 tablet 0   metoprolol  tartrate (LOPRESSOR ) 50 MG tablet TAKE 1 TABLET 2 HR PRIOR TO CARDIAC PROCEDURE 1 tablet 0   mirtazapine  (REMERON ) 7.5 MG tablet TAKE ONE TABLET (7.5 MG TOTAL) BY MOUTH AT BEDTIME. 90 tablet 1   potassium chloride   SA (KLOR-CON  M) 20 MEQ tablet TAKE ONE TABLET (20 MEQ TOTAL) BY MOUTH DAILY. 30 tablet 1   predniSONE  (DELTASONE ) 20 MG tablet Take 2 tablets (40 mg total) by mouth daily with breakfast. 10 tablet 0   rosuvastatin  (CRESTOR ) 20 MG tablet TAKE ONE TABLET (20 MG) BY MOUTH DAILY. 30 tablet 0   tiZANidine  (ZANAFLEX ) 2 MG tablet Take 1 tablet (2 mg total) by mouth every 6 (six) hours as needed for muscle spasms. 30 tablet 5   trandolapril  (MAVIK ) 4 MG tablet Take 1 tablet (4 mg total) by mouth daily. 90 tablet 1   No current facility-administered medications on file prior to visit.    ALLERGIES: Allergies  Allergen Reactions   Thiazide-Type Diuretics     Hypokalemia, Hyponatremia, Chest pain   Mushroom Extract Complex (Obsolete)    Sulfa Antibiotics     FAMILY HISTORY: Family History   Problem Relation Age of Onset   Arthritis Mother    Heart disease Mother    Hypertension Mother    Diabetes Mother    Arrhythmia Mother        A-Fib    Cancer Father        Lung Cancer   Diabetes Brother    Asthma Daughter    Cancer Paternal Uncle        Prostate cancer   Heart disease Maternal Grandfather    Heart disease Paternal Grandfather       Objective:  Blood pressure 100/66, pulse 61, height 5' 5 (1.651 m), weight 150 lb (68 kg), SpO2 99%.. General: No acute distress.  Patient appears well-groomed.   Head:  Normocephalic/atraumatic Neck:  Supple.  No paraspinal tenderness.  Full range of motion. Heart:  Regular rate and rhythm. Neuro:  Alert and oriented to self and place.  Speech fluent and not dysarthric.  Expressive language intact.  Difficulty following some commands.    12/28/2023    8:00 AM 02/01/2023    2:00 PM 06/01/2022    3:00 PM 07/21/2021    4:00 PM 07/13/2019    1:00 PM 01/13/2019    1:45 PM  MMSE - Mini Mental State Exam  Orientation to time 0 0 1 1 4 4   Orientation to Place 2 5 5 5 5 5   Registration 3 3 3 3 3 3   Attention/ Calculation 0 0 0 1 0 2  Recall 1 0 0 1 1 1   Language- name 2 objects 2 2 2 2 2 2   Language- repeat 0 0 0 0 0 1  Language- follow 3 step command 3 3 3 2 2 3   Language- read & follow direction 0 1 0 0 1 1  Write a sentence 0 0 0 1 1 1   Copy design 0 0 0 0 0 0  Total score 11 14 14 16 19 23     CN II-XII intact.  Bulk and tone normal.  Muscle strength 5/5 throughout.  Sensation to light touch intact.  Deep tendon reflexes 2+ throughout, toes downgoing.  Gait normal.  Romberg negative.    Juliene Dunnings, DO  CC: [name], MD

## 2023-12-28 ENCOUNTER — Ambulatory Visit: Attending: Cardiology

## 2023-12-28 ENCOUNTER — Ambulatory Visit (INDEPENDENT_AMBULATORY_CARE_PROVIDER_SITE_OTHER): Admitting: Neurology

## 2023-12-28 VITALS — BP 100/66 | HR 61 | Ht 65.0 in | Wt 150.0 lb

## 2023-12-28 DIAGNOSIS — G300 Alzheimer's disease with early onset: Secondary | ICD-10-CM | POA: Diagnosis not present

## 2023-12-28 DIAGNOSIS — F02B4 Dementia in other diseases classified elsewhere, moderate, with anxiety: Secondary | ICD-10-CM

## 2023-12-28 DIAGNOSIS — R072 Precordial pain: Secondary | ICD-10-CM

## 2023-12-28 DIAGNOSIS — G44221 Chronic tension-type headache, intractable: Secondary | ICD-10-CM | POA: Diagnosis not present

## 2023-12-28 LAB — ECHOCARDIOGRAM COMPLETE
AR max vel: 2.58 cm2
AV Area VTI: 2.83 cm2
AV Area mean vel: 2.09 cm2
AV Mean grad: 6 mmHg
AV Peak grad: 10.6 mmHg
Ao pk vel: 1.63 m/s
Area-P 1/2: 2.87 cm2
Height: 65 in
S' Lateral: 2 cm
Weight: 2400 [oz_av]

## 2023-12-28 MED ORDER — MIRTAZAPINE 15 MG PO TABS: 15.0000 mg | ORAL_TABLET | Freq: Every day | ORAL | 5 refills | Status: AC

## 2023-12-28 MED ORDER — MEMANTINE HCL 10 MG PO TABS: 10.0000 mg | ORAL_TABLET | Freq: Two times a day (BID) | ORAL | 1 refills | Status: AC

## 2023-12-28 MED ORDER — DONEPEZIL HCL 10 MG PO TABS: 10.0000 mg | ORAL_TABLET | Freq: Every day | ORAL | 1 refills | Status: AC

## 2023-12-28 NOTE — Patient Instructions (Signed)
 Increase mirtazapine  to 15mg  at bedtime Continue donepezil  10mg  at bedtime and memantine  10mg  twice daily Limit use of pain relievers to no more than 9 days out of the month to prevent risk of rebound or medication-overuse headache. Follow up 9 months.

## 2023-12-30 ENCOUNTER — Ambulatory Visit: Payer: Self-pay | Admitting: Cardiology

## 2024-01-06 ENCOUNTER — Other Ambulatory Visit: Payer: Self-pay | Admitting: Nurse Practitioner

## 2024-01-06 DIAGNOSIS — E785 Hyperlipidemia, unspecified: Secondary | ICD-10-CM

## 2024-01-18 ENCOUNTER — Other Ambulatory Visit: Payer: Self-pay | Admitting: Nurse Practitioner

## 2024-02-10 ENCOUNTER — Other Ambulatory Visit: Payer: Self-pay | Admitting: Nurse Practitioner

## 2024-02-10 DIAGNOSIS — E876 Hypokalemia: Secondary | ICD-10-CM

## 2024-02-10 NOTE — Telephone Encounter (Signed)
 I have refilled the medication. But she is over due for follow up. Please scheduled OV.

## 2024-02-22 ENCOUNTER — Ambulatory Visit: Admitting: Cardiology

## 2024-02-22 NOTE — Progress Notes (Deleted)
°  Cardiology Office Note   Date:  02/22/2024  ID:  Christina Galvan, DOB [birth year], MRN [id number] PCP: Christina Saber, NP  [facility] Providers Cardiologist:  None { Click to update primary MD,subspecialty MD or APP then REFRESH:1}    History of Present Illness Christina Galvan is a 58 y.o. female with past medical history of hypertension, hyperlipidemia, current smoker x 40+ years, anxiety/depression, gastroesophageal reflux disease, and dementia who presents today for follow-up.  Patient was last seen in clinic 11/18/2023 by Dr. Darliss.  At that time she been doing well with some chest pressure for about 3 weeks..  Her sister was available to assist with history due to condition of the patient.  She endorsed chest pain off and on for the past 3 to 6 months.  Pain was located in the left side of her chest radiating down into her left shoulder.  Symptoms were not associated with exertion.  She stated that her mother had a stent placed in her 45s.  Denied any other associated symptoms.  She was scheduled for an echocardiogram and a coronary CTA to evaluate coronary artery disease.  She returns to clinic today  ROS: 10 point review of system has been reviewed and considered negative the exception was been listed in the HPI  Studies Reviewed     2d echo 12/28/2023 1. Left ventricular ejection fraction, by estimation, is 60 to 65%. Left  ventricular ejection fraction by 3D volume is 60 %. The left ventricle has  normal function. The left ventricle has no regional wall motion  abnormalities. Left ventricular diastolic   parameters are consistent with Grade I diastolic dysfunction (impaired  relaxation). The average left ventricular global longitudinal strain is  -21.2 %. The global longitudinal strain is normal.   2. Right ventricular systolic function is normal. The right ventricular  size is normal. Tricuspid regurgitation signal is inadequate for assessing  PA pressure.    3. The mitral valve is normal in structure. Mild mitral valve  regurgitation. No evidence of mitral stenosis.   4. The aortic valve is normal in structure. Aortic valve regurgitation is  mild. No aortic stenosis is present.   5. The inferior vena cava is normal in size with greater than 50%  respiratory variability, suggesting right atrial pressure of 3 mmHg.   Risk Assessment/Calculations   No BP recorded.  {Refresh Note OR Click here to enter BP  :1}***       Physical Exam VS:  There were no vitals taken for this visit.       Wt Readings from Last 3 Encounters:  12/28/23 150 lb (68 kg)  11/18/23 149 lb (67.6 kg)  08/06/23 141 lb 6.4 oz (64.1 kg)    GEN: Well nourished, well developed in no acute distress NECK: No JVD; No carotid bruits CARDIAC: ***RRR, no murmurs, rubs, gallops RESPIRATORY:  Clear to auscultation without rales, wheezing or rhonchi  ABDOMEN: Soft, non-tender, non-distended EXTREMITIES:  No edema; No deformity   ASSESSMENT AND PLAN Precordial pain Primary hypertension Pure hypercholesterolemia Current smoker    {Are you ordering a CV Procedure (e.g. stress test, cath, DCCV, TEE, etc)?   Press F2        :[id number]}  Dispo: ***  Signed, Christina Kozub, NP

## 2024-03-13 ENCOUNTER — Other Ambulatory Visit: Payer: Self-pay | Admitting: Nurse Practitioner

## 2024-03-14 ENCOUNTER — Other Ambulatory Visit: Payer: Self-pay | Admitting: Nurse Practitioner

## 2024-03-14 DIAGNOSIS — E785 Hyperlipidemia, unspecified: Secondary | ICD-10-CM

## 2024-04-05 ENCOUNTER — Other Ambulatory Visit: Payer: Self-pay | Admitting: Nurse Practitioner

## 2024-04-05 DIAGNOSIS — E876 Hypokalemia: Secondary | ICD-10-CM

## 2024-04-14 ENCOUNTER — Other Ambulatory Visit: Payer: Self-pay | Admitting: Nurse Practitioner

## 2024-04-14 DIAGNOSIS — E785 Hyperlipidemia, unspecified: Secondary | ICD-10-CM

## 2024-05-23 ENCOUNTER — Ambulatory Visit

## 2024-09-26 ENCOUNTER — Ambulatory Visit: Admitting: Neurology
